# Patient Record
Sex: Female | Born: 1935 | Race: White | Hispanic: No | Smoking: Former smoker
Health system: Southern US, Community
[De-identification: ages and names within clinical notes are randomized; demographics above are authoritative.]

## PROBLEM LIST (undated history)

## (undated) DIAGNOSIS — M4800 Spinal stenosis, site unspecified: Secondary | ICD-10-CM

## (undated) DIAGNOSIS — I739 Peripheral vascular disease, unspecified: Secondary | ICD-10-CM

## (undated) DIAGNOSIS — E785 Hyperlipidemia, unspecified: Secondary | ICD-10-CM

## (undated) DIAGNOSIS — I701 Atherosclerosis of renal artery: Secondary | ICD-10-CM

## (undated) DIAGNOSIS — E039 Hypothyroidism, unspecified: Secondary | ICD-10-CM

## (undated) DIAGNOSIS — F32A Depression, unspecified: Secondary | ICD-10-CM

## (undated) DIAGNOSIS — I509 Heart failure, unspecified: Secondary | ICD-10-CM

## (undated) DIAGNOSIS — G629 Polyneuropathy, unspecified: Secondary | ICD-10-CM

## (undated) DIAGNOSIS — H8110 Benign paroxysmal vertigo, unspecified ear: Secondary | ICD-10-CM

## (undated) DIAGNOSIS — F329 Major depressive disorder, single episode, unspecified: Secondary | ICD-10-CM

## (undated) DIAGNOSIS — I10 Essential (primary) hypertension: Secondary | ICD-10-CM

## (undated) DIAGNOSIS — M810 Age-related osteoporosis without current pathological fracture: Secondary | ICD-10-CM

## (undated) DIAGNOSIS — E119 Type 2 diabetes mellitus without complications: Secondary | ICD-10-CM

## (undated) DIAGNOSIS — J45909 Unspecified asthma, uncomplicated: Secondary | ICD-10-CM

## (undated) DIAGNOSIS — J449 Chronic obstructive pulmonary disease, unspecified: Secondary | ICD-10-CM

## (undated) DIAGNOSIS — K219 Gastro-esophageal reflux disease without esophagitis: Secondary | ICD-10-CM

## (undated) DIAGNOSIS — G609 Hereditary and idiopathic neuropathy, unspecified: Secondary | ICD-10-CM

## (undated) HISTORY — PX: ABDOMINAL HYSTERECTOMY: SHX81

## (undated) HISTORY — DX: Chronic obstructive pulmonary disease, unspecified: J44.9

## (undated) HISTORY — DX: Depression, unspecified: F32.A

## (undated) HISTORY — DX: Hypothyroidism, unspecified: E03.9

## (undated) HISTORY — DX: Spinal stenosis, site unspecified: M48.00

## (undated) HISTORY — DX: Hereditary and idiopathic neuropathy, unspecified: G60.9

## (undated) HISTORY — DX: Gastro-esophageal reflux disease without esophagitis: K21.9

## (undated) HISTORY — DX: Peripheral vascular disease, unspecified: I73.9

## (undated) HISTORY — DX: Essential (primary) hypertension: I10

## (undated) HISTORY — DX: Atherosclerosis of renal artery: I70.1

## (undated) HISTORY — DX: Major depressive disorder, single episode, unspecified: F32.9

## (undated) HISTORY — DX: Type 2 diabetes mellitus without complications: E11.9

## (undated) HISTORY — PX: VEIN SURGERY: SHX48

## (undated) HISTORY — DX: Polyneuropathy, unspecified: G62.9

## (undated) HISTORY — DX: Benign paroxysmal vertigo, unspecified ear: H81.10

## (undated) HISTORY — PX: KIDNEY SURGERY: SHX687

## (undated) HISTORY — DX: Hyperlipidemia, unspecified: E78.5

## (undated) HISTORY — PX: TONSILLECTOMY: SHX5217

## (undated) HISTORY — PX: APPENDECTOMY: SHX54

## (undated) HISTORY — DX: Age-related osteoporosis without current pathological fracture: M81.0

## (undated) HISTORY — PX: CATARACT EXTRACTION W/PHACO: SHX586

## (undated) HISTORY — PX: ILIAC ARTERY STENT: SHX1786

## (undated) HISTORY — PX: BACK SURGERY: SHX140

---

## 1998-07-07 ENCOUNTER — Other Ambulatory Visit: Admission: RE | Admit: 1998-07-07 | Discharge: 1998-07-07 | Payer: Self-pay | Admitting: Family Medicine

## 1999-08-04 ENCOUNTER — Encounter: Payer: Self-pay | Admitting: Nephrology

## 1999-08-04 ENCOUNTER — Encounter: Admission: RE | Admit: 1999-08-04 | Discharge: 1999-08-04 | Payer: Self-pay | Admitting: Nephrology

## 1999-08-09 ENCOUNTER — Encounter: Payer: Self-pay | Admitting: Nephrology

## 1999-08-09 ENCOUNTER — Ambulatory Visit (HOSPITAL_COMMUNITY): Admission: RE | Admit: 1999-08-09 | Discharge: 1999-08-09 | Payer: Self-pay | Admitting: Nephrology

## 1999-11-26 ENCOUNTER — Ambulatory Visit (HOSPITAL_COMMUNITY): Admission: RE | Admit: 1999-11-26 | Discharge: 1999-11-26 | Payer: Self-pay | Admitting: Family Medicine

## 2000-01-07 ENCOUNTER — Encounter: Payer: Self-pay | Admitting: Family Medicine

## 2000-01-07 ENCOUNTER — Encounter: Admission: RE | Admit: 2000-01-07 | Discharge: 2000-01-07 | Payer: Self-pay | Admitting: Family Medicine

## 2000-04-13 ENCOUNTER — Encounter: Payer: Self-pay | Admitting: Neurosurgery

## 2000-04-13 ENCOUNTER — Ambulatory Visit (HOSPITAL_COMMUNITY): Admission: RE | Admit: 2000-04-13 | Discharge: 2000-04-13 | Payer: Self-pay | Admitting: Neurosurgery

## 2000-04-27 ENCOUNTER — Encounter: Payer: Self-pay | Admitting: Neurosurgery

## 2000-04-27 ENCOUNTER — Ambulatory Visit (HOSPITAL_COMMUNITY): Admission: RE | Admit: 2000-04-27 | Discharge: 2000-04-27 | Payer: Self-pay | Admitting: Neurosurgery

## 2000-05-11 ENCOUNTER — Ambulatory Visit (HOSPITAL_COMMUNITY): Admission: RE | Admit: 2000-05-11 | Discharge: 2000-05-11 | Payer: Self-pay | Admitting: Neurosurgery

## 2000-05-11 ENCOUNTER — Encounter: Payer: Self-pay | Admitting: Neurosurgery

## 2001-07-18 ENCOUNTER — Encounter: Admission: RE | Admit: 2001-07-18 | Discharge: 2001-10-16 | Payer: Self-pay | Admitting: Unknown Physician Specialty

## 2002-03-15 ENCOUNTER — Encounter: Payer: Self-pay | Admitting: Family Medicine

## 2002-03-15 ENCOUNTER — Encounter: Admission: RE | Admit: 2002-03-15 | Discharge: 2002-03-15 | Payer: Self-pay | Admitting: Family Medicine

## 2004-07-07 ENCOUNTER — Ambulatory Visit (HOSPITAL_COMMUNITY): Admission: RE | Admit: 2004-07-07 | Discharge: 2004-07-07 | Payer: Self-pay | Admitting: *Deleted

## 2004-10-18 ENCOUNTER — Emergency Department: Payer: Self-pay | Admitting: Emergency Medicine

## 2004-11-10 ENCOUNTER — Observation Stay (HOSPITAL_COMMUNITY): Admission: RE | Admit: 2004-11-10 | Discharge: 2004-11-11 | Payer: Self-pay | Admitting: Urology

## 2005-02-11 ENCOUNTER — Ambulatory Visit: Payer: Self-pay | Admitting: Ophthalmology

## 2005-02-15 ENCOUNTER — Ambulatory Visit: Payer: Self-pay | Admitting: Ophthalmology

## 2005-05-27 ENCOUNTER — Ambulatory Visit: Payer: Self-pay | Admitting: Ophthalmology

## 2005-05-31 ENCOUNTER — Ambulatory Visit: Payer: Self-pay | Admitting: Ophthalmology

## 2005-10-27 ENCOUNTER — Encounter: Admission: RE | Admit: 2005-10-27 | Discharge: 2005-10-27 | Payer: Self-pay | Admitting: Family Medicine

## 2005-12-12 ENCOUNTER — Ambulatory Visit: Payer: Self-pay | Admitting: Unknown Physician Specialty

## 2005-12-12 ENCOUNTER — Other Ambulatory Visit: Payer: Self-pay

## 2005-12-15 ENCOUNTER — Ambulatory Visit: Payer: Self-pay | Admitting: Unknown Physician Specialty

## 2006-08-08 ENCOUNTER — Ambulatory Visit: Payer: Self-pay | Admitting: *Deleted

## 2007-01-24 ENCOUNTER — Ambulatory Visit: Payer: Self-pay | Admitting: Unknown Physician Specialty

## 2008-04-29 ENCOUNTER — Ambulatory Visit: Payer: Self-pay | Admitting: Cardiology

## 2008-04-30 ENCOUNTER — Ambulatory Visit: Payer: Self-pay | Admitting: Unknown Physician Specialty

## 2008-04-30 ENCOUNTER — Other Ambulatory Visit: Payer: Self-pay

## 2008-05-02 ENCOUNTER — Ambulatory Visit: Payer: Self-pay | Admitting: Unknown Physician Specialty

## 2008-11-11 ENCOUNTER — Ambulatory Visit: Payer: Self-pay | Admitting: Unknown Physician Specialty

## 2008-11-12 ENCOUNTER — Ambulatory Visit: Payer: Self-pay | Admitting: Unknown Physician Specialty

## 2009-04-08 ENCOUNTER — Ambulatory Visit: Payer: Self-pay | Admitting: General Practice

## 2009-04-13 ENCOUNTER — Inpatient Hospital Stay: Payer: Self-pay | Admitting: General Practice

## 2009-04-18 ENCOUNTER — Encounter: Payer: Self-pay | Admitting: Internal Medicine

## 2009-05-03 ENCOUNTER — Encounter: Payer: Self-pay | Admitting: Internal Medicine

## 2010-09-21 ENCOUNTER — Ambulatory Visit: Payer: Self-pay | Admitting: Vascular Surgery

## 2011-06-13 ENCOUNTER — Ambulatory Visit: Payer: Self-pay | Admitting: Surgery

## 2011-10-25 DIAGNOSIS — M7660 Achilles tendinitis, unspecified leg: Secondary | ICD-10-CM | POA: Diagnosis not present

## 2011-11-07 DIAGNOSIS — E7800 Pure hypercholesterolemia, unspecified: Secondary | ICD-10-CM | POA: Diagnosis not present

## 2011-11-07 DIAGNOSIS — Z79899 Other long term (current) drug therapy: Secondary | ICD-10-CM | POA: Diagnosis not present

## 2011-11-07 DIAGNOSIS — I10 Essential (primary) hypertension: Secondary | ICD-10-CM | POA: Diagnosis not present

## 2011-11-07 DIAGNOSIS — E119 Type 2 diabetes mellitus without complications: Secondary | ICD-10-CM | POA: Diagnosis not present

## 2011-11-07 DIAGNOSIS — E039 Hypothyroidism, unspecified: Secondary | ICD-10-CM | POA: Diagnosis not present

## 2011-11-07 DIAGNOSIS — R42 Dizziness and giddiness: Secondary | ICD-10-CM | POA: Diagnosis not present

## 2011-11-11 DIAGNOSIS — L259 Unspecified contact dermatitis, unspecified cause: Secondary | ICD-10-CM | POA: Diagnosis not present

## 2011-11-11 DIAGNOSIS — F341 Dysthymic disorder: Secondary | ICD-10-CM | POA: Diagnosis not present

## 2011-11-11 DIAGNOSIS — J029 Acute pharyngitis, unspecified: Secondary | ICD-10-CM | POA: Diagnosis not present

## 2011-11-22 DIAGNOSIS — F341 Dysthymic disorder: Secondary | ICD-10-CM | POA: Diagnosis not present

## 2011-11-22 DIAGNOSIS — J209 Acute bronchitis, unspecified: Secondary | ICD-10-CM | POA: Diagnosis not present

## 2011-12-04 DIAGNOSIS — M549 Dorsalgia, unspecified: Secondary | ICD-10-CM | POA: Diagnosis not present

## 2011-12-04 DIAGNOSIS — M4800 Spinal stenosis, site unspecified: Secondary | ICD-10-CM | POA: Diagnosis not present

## 2011-12-05 DIAGNOSIS — R42 Dizziness and giddiness: Secondary | ICD-10-CM | POA: Diagnosis not present

## 2011-12-05 DIAGNOSIS — F341 Dysthymic disorder: Secondary | ICD-10-CM | POA: Diagnosis not present

## 2011-12-05 DIAGNOSIS — I10 Essential (primary) hypertension: Secondary | ICD-10-CM | POA: Diagnosis not present

## 2011-12-05 DIAGNOSIS — M48061 Spinal stenosis, lumbar region without neurogenic claudication: Secondary | ICD-10-CM | POA: Diagnosis not present

## 2011-12-12 ENCOUNTER — Encounter: Payer: Self-pay | Admitting: *Deleted

## 2011-12-15 DIAGNOSIS — M4800 Spinal stenosis, site unspecified: Secondary | ICD-10-CM | POA: Diagnosis not present

## 2011-12-15 DIAGNOSIS — IMO0002 Reserved for concepts with insufficient information to code with codable children: Secondary | ICD-10-CM | POA: Diagnosis not present

## 2011-12-27 DIAGNOSIS — IMO0002 Reserved for concepts with insufficient information to code with codable children: Secondary | ICD-10-CM | POA: Diagnosis not present

## 2011-12-27 DIAGNOSIS — M48062 Spinal stenosis, lumbar region with neurogenic claudication: Secondary | ICD-10-CM | POA: Diagnosis not present

## 2011-12-27 DIAGNOSIS — M47817 Spondylosis without myelopathy or radiculopathy, lumbosacral region: Secondary | ICD-10-CM | POA: Diagnosis not present

## 2012-01-03 DIAGNOSIS — H40009 Preglaucoma, unspecified, unspecified eye: Secondary | ICD-10-CM | POA: Diagnosis not present

## 2012-01-26 DIAGNOSIS — M48061 Spinal stenosis, lumbar region without neurogenic claudication: Secondary | ICD-10-CM | POA: Diagnosis not present

## 2012-01-26 DIAGNOSIS — M5126 Other intervertebral disc displacement, lumbar region: Secondary | ICD-10-CM | POA: Diagnosis not present

## 2012-01-26 DIAGNOSIS — I10 Essential (primary) hypertension: Secondary | ICD-10-CM | POA: Diagnosis not present

## 2012-01-26 DIAGNOSIS — IMO0002 Reserved for concepts with insufficient information to code with codable children: Secondary | ICD-10-CM | POA: Diagnosis not present

## 2012-01-26 DIAGNOSIS — IMO0001 Reserved for inherently not codable concepts without codable children: Secondary | ICD-10-CM | POA: Diagnosis not present

## 2012-01-31 DIAGNOSIS — M4800 Spinal stenosis, site unspecified: Secondary | ICD-10-CM | POA: Diagnosis not present

## 2012-01-31 DIAGNOSIS — M48061 Spinal stenosis, lumbar region without neurogenic claudication: Secondary | ICD-10-CM | POA: Diagnosis not present

## 2012-01-31 DIAGNOSIS — IMO0001 Reserved for inherently not codable concepts without codable children: Secondary | ICD-10-CM | POA: Diagnosis not present

## 2012-01-31 DIAGNOSIS — M5126 Other intervertebral disc displacement, lumbar region: Secondary | ICD-10-CM | POA: Diagnosis not present

## 2012-01-31 DIAGNOSIS — I10 Essential (primary) hypertension: Secondary | ICD-10-CM | POA: Diagnosis not present

## 2012-01-31 DIAGNOSIS — IMO0002 Reserved for concepts with insufficient information to code with codable children: Secondary | ICD-10-CM | POA: Diagnosis not present

## 2012-02-02 DIAGNOSIS — M5126 Other intervertebral disc displacement, lumbar region: Secondary | ICD-10-CM | POA: Diagnosis not present

## 2012-02-02 DIAGNOSIS — I10 Essential (primary) hypertension: Secondary | ICD-10-CM | POA: Diagnosis not present

## 2012-02-02 DIAGNOSIS — IMO0001 Reserved for inherently not codable concepts without codable children: Secondary | ICD-10-CM | POA: Diagnosis not present

## 2012-02-02 DIAGNOSIS — IMO0002 Reserved for concepts with insufficient information to code with codable children: Secondary | ICD-10-CM | POA: Diagnosis not present

## 2012-02-02 DIAGNOSIS — M48061 Spinal stenosis, lumbar region without neurogenic claudication: Secondary | ICD-10-CM | POA: Diagnosis not present

## 2012-02-03 DIAGNOSIS — I10 Essential (primary) hypertension: Secondary | ICD-10-CM | POA: Diagnosis not present

## 2012-02-03 DIAGNOSIS — M48061 Spinal stenosis, lumbar region without neurogenic claudication: Secondary | ICD-10-CM | POA: Diagnosis not present

## 2012-02-03 DIAGNOSIS — IMO0002 Reserved for concepts with insufficient information to code with codable children: Secondary | ICD-10-CM | POA: Diagnosis not present

## 2012-02-03 DIAGNOSIS — M5126 Other intervertebral disc displacement, lumbar region: Secondary | ICD-10-CM | POA: Diagnosis not present

## 2012-02-03 DIAGNOSIS — IMO0001 Reserved for inherently not codable concepts without codable children: Secondary | ICD-10-CM | POA: Diagnosis not present

## 2012-02-06 DIAGNOSIS — M5126 Other intervertebral disc displacement, lumbar region: Secondary | ICD-10-CM | POA: Diagnosis not present

## 2012-02-06 DIAGNOSIS — IMO0001 Reserved for inherently not codable concepts without codable children: Secondary | ICD-10-CM | POA: Diagnosis not present

## 2012-02-06 DIAGNOSIS — I10 Essential (primary) hypertension: Secondary | ICD-10-CM | POA: Diagnosis not present

## 2012-02-06 DIAGNOSIS — IMO0002 Reserved for concepts with insufficient information to code with codable children: Secondary | ICD-10-CM | POA: Diagnosis not present

## 2012-02-06 DIAGNOSIS — M48061 Spinal stenosis, lumbar region without neurogenic claudication: Secondary | ICD-10-CM | POA: Diagnosis not present

## 2012-02-09 DIAGNOSIS — I10 Essential (primary) hypertension: Secondary | ICD-10-CM | POA: Diagnosis not present

## 2012-02-09 DIAGNOSIS — IMO0001 Reserved for inherently not codable concepts without codable children: Secondary | ICD-10-CM | POA: Diagnosis not present

## 2012-02-09 DIAGNOSIS — M5126 Other intervertebral disc displacement, lumbar region: Secondary | ICD-10-CM | POA: Diagnosis not present

## 2012-02-09 DIAGNOSIS — IMO0002 Reserved for concepts with insufficient information to code with codable children: Secondary | ICD-10-CM | POA: Diagnosis not present

## 2012-02-09 DIAGNOSIS — M48061 Spinal stenosis, lumbar region without neurogenic claudication: Secondary | ICD-10-CM | POA: Diagnosis not present

## 2012-02-14 DIAGNOSIS — IMO0002 Reserved for concepts with insufficient information to code with codable children: Secondary | ICD-10-CM | POA: Diagnosis not present

## 2012-02-14 DIAGNOSIS — M5126 Other intervertebral disc displacement, lumbar region: Secondary | ICD-10-CM | POA: Diagnosis not present

## 2012-02-14 DIAGNOSIS — I10 Essential (primary) hypertension: Secondary | ICD-10-CM | POA: Diagnosis not present

## 2012-02-14 DIAGNOSIS — IMO0001 Reserved for inherently not codable concepts without codable children: Secondary | ICD-10-CM | POA: Diagnosis not present

## 2012-02-14 DIAGNOSIS — M48061 Spinal stenosis, lumbar region without neurogenic claudication: Secondary | ICD-10-CM | POA: Diagnosis not present

## 2012-02-16 DIAGNOSIS — M5126 Other intervertebral disc displacement, lumbar region: Secondary | ICD-10-CM | POA: Diagnosis not present

## 2012-02-16 DIAGNOSIS — IMO0001 Reserved for inherently not codable concepts without codable children: Secondary | ICD-10-CM | POA: Diagnosis not present

## 2012-02-16 DIAGNOSIS — IMO0002 Reserved for concepts with insufficient information to code with codable children: Secondary | ICD-10-CM | POA: Diagnosis not present

## 2012-02-16 DIAGNOSIS — I10 Essential (primary) hypertension: Secondary | ICD-10-CM | POA: Diagnosis not present

## 2012-02-16 DIAGNOSIS — M48061 Spinal stenosis, lumbar region without neurogenic claudication: Secondary | ICD-10-CM | POA: Diagnosis not present

## 2012-02-20 DIAGNOSIS — IMO0001 Reserved for inherently not codable concepts without codable children: Secondary | ICD-10-CM | POA: Diagnosis not present

## 2012-02-20 DIAGNOSIS — M5126 Other intervertebral disc displacement, lumbar region: Secondary | ICD-10-CM | POA: Diagnosis not present

## 2012-02-20 DIAGNOSIS — I10 Essential (primary) hypertension: Secondary | ICD-10-CM | POA: Diagnosis not present

## 2012-02-20 DIAGNOSIS — IMO0002 Reserved for concepts with insufficient information to code with codable children: Secondary | ICD-10-CM | POA: Diagnosis not present

## 2012-02-20 DIAGNOSIS — M48061 Spinal stenosis, lumbar region without neurogenic claudication: Secondary | ICD-10-CM | POA: Diagnosis not present

## 2012-02-21 DIAGNOSIS — R42 Dizziness and giddiness: Secondary | ICD-10-CM | POA: Diagnosis not present

## 2012-02-21 DIAGNOSIS — J309 Allergic rhinitis, unspecified: Secondary | ICD-10-CM | POA: Diagnosis not present

## 2012-02-23 DIAGNOSIS — IMO0002 Reserved for concepts with insufficient information to code with codable children: Secondary | ICD-10-CM | POA: Diagnosis not present

## 2012-02-23 DIAGNOSIS — M5126 Other intervertebral disc displacement, lumbar region: Secondary | ICD-10-CM | POA: Diagnosis not present

## 2012-02-23 DIAGNOSIS — M48061 Spinal stenosis, lumbar region without neurogenic claudication: Secondary | ICD-10-CM | POA: Diagnosis not present

## 2012-02-23 DIAGNOSIS — I10 Essential (primary) hypertension: Secondary | ICD-10-CM | POA: Diagnosis not present

## 2012-02-23 DIAGNOSIS — IMO0001 Reserved for inherently not codable concepts without codable children: Secondary | ICD-10-CM | POA: Diagnosis not present

## 2012-02-27 DIAGNOSIS — M5126 Other intervertebral disc displacement, lumbar region: Secondary | ICD-10-CM | POA: Diagnosis not present

## 2012-02-27 DIAGNOSIS — I10 Essential (primary) hypertension: Secondary | ICD-10-CM | POA: Diagnosis not present

## 2012-02-27 DIAGNOSIS — M48061 Spinal stenosis, lumbar region without neurogenic claudication: Secondary | ICD-10-CM | POA: Diagnosis not present

## 2012-02-27 DIAGNOSIS — IMO0001 Reserved for inherently not codable concepts without codable children: Secondary | ICD-10-CM | POA: Diagnosis not present

## 2012-02-27 DIAGNOSIS — IMO0002 Reserved for concepts with insufficient information to code with codable children: Secondary | ICD-10-CM | POA: Diagnosis not present

## 2012-03-02 DIAGNOSIS — IMO0001 Reserved for inherently not codable concepts without codable children: Secondary | ICD-10-CM | POA: Diagnosis not present

## 2012-03-02 DIAGNOSIS — I10 Essential (primary) hypertension: Secondary | ICD-10-CM | POA: Diagnosis not present

## 2012-03-02 DIAGNOSIS — M48061 Spinal stenosis, lumbar region without neurogenic claudication: Secondary | ICD-10-CM | POA: Diagnosis not present

## 2012-03-02 DIAGNOSIS — M5126 Other intervertebral disc displacement, lumbar region: Secondary | ICD-10-CM | POA: Diagnosis not present

## 2012-03-02 DIAGNOSIS — IMO0002 Reserved for concepts with insufficient information to code with codable children: Secondary | ICD-10-CM | POA: Diagnosis not present

## 2012-03-05 DIAGNOSIS — M48061 Spinal stenosis, lumbar region without neurogenic claudication: Secondary | ICD-10-CM | POA: Diagnosis not present

## 2012-03-05 DIAGNOSIS — M5126 Other intervertebral disc displacement, lumbar region: Secondary | ICD-10-CM | POA: Diagnosis not present

## 2012-03-05 DIAGNOSIS — IMO0002 Reserved for concepts with insufficient information to code with codable children: Secondary | ICD-10-CM | POA: Diagnosis not present

## 2012-03-05 DIAGNOSIS — IMO0001 Reserved for inherently not codable concepts without codable children: Secondary | ICD-10-CM | POA: Diagnosis not present

## 2012-03-05 DIAGNOSIS — I10 Essential (primary) hypertension: Secondary | ICD-10-CM | POA: Diagnosis not present

## 2012-03-07 DIAGNOSIS — E039 Hypothyroidism, unspecified: Secondary | ICD-10-CM | POA: Diagnosis not present

## 2012-03-07 DIAGNOSIS — R42 Dizziness and giddiness: Secondary | ICD-10-CM | POA: Diagnosis not present

## 2012-03-07 DIAGNOSIS — I10 Essential (primary) hypertension: Secondary | ICD-10-CM | POA: Diagnosis not present

## 2012-03-07 DIAGNOSIS — E119 Type 2 diabetes mellitus without complications: Secondary | ICD-10-CM | POA: Diagnosis not present

## 2012-03-07 DIAGNOSIS — E7800 Pure hypercholesterolemia, unspecified: Secondary | ICD-10-CM | POA: Diagnosis not present

## 2012-03-23 DIAGNOSIS — R42 Dizziness and giddiness: Secondary | ICD-10-CM | POA: Diagnosis not present

## 2012-03-23 DIAGNOSIS — J309 Allergic rhinitis, unspecified: Secondary | ICD-10-CM | POA: Diagnosis not present

## 2012-03-23 DIAGNOSIS — M48061 Spinal stenosis, lumbar region without neurogenic claudication: Secondary | ICD-10-CM | POA: Diagnosis not present

## 2012-06-11 DIAGNOSIS — R5383 Other fatigue: Secondary | ICD-10-CM | POA: Diagnosis not present

## 2012-06-11 DIAGNOSIS — E119 Type 2 diabetes mellitus without complications: Secondary | ICD-10-CM | POA: Diagnosis not present

## 2012-06-11 DIAGNOSIS — I10 Essential (primary) hypertension: Secondary | ICD-10-CM | POA: Diagnosis not present

## 2012-06-11 DIAGNOSIS — E7800 Pure hypercholesterolemia, unspecified: Secondary | ICD-10-CM | POA: Diagnosis not present

## 2012-06-11 DIAGNOSIS — R5381 Other malaise: Secondary | ICD-10-CM | POA: Diagnosis not present

## 2012-06-15 DIAGNOSIS — R42 Dizziness and giddiness: Secondary | ICD-10-CM | POA: Diagnosis not present

## 2012-06-15 DIAGNOSIS — I8310 Varicose veins of unspecified lower extremity with inflammation: Secondary | ICD-10-CM | POA: Diagnosis not present

## 2012-06-15 DIAGNOSIS — G609 Hereditary and idiopathic neuropathy, unspecified: Secondary | ICD-10-CM | POA: Diagnosis not present

## 2012-06-26 DIAGNOSIS — J209 Acute bronchitis, unspecified: Secondary | ICD-10-CM | POA: Diagnosis not present

## 2012-07-02 DIAGNOSIS — I10 Essential (primary) hypertension: Secondary | ICD-10-CM | POA: Diagnosis not present

## 2012-07-09 DIAGNOSIS — Z23 Encounter for immunization: Secondary | ICD-10-CM | POA: Diagnosis not present

## 2012-07-09 DIAGNOSIS — I10 Essential (primary) hypertension: Secondary | ICD-10-CM | POA: Diagnosis not present

## 2012-07-10 DIAGNOSIS — H4010X0 Unspecified open-angle glaucoma, stage unspecified: Secondary | ICD-10-CM | POA: Diagnosis not present

## 2012-07-16 DIAGNOSIS — R05 Cough: Secondary | ICD-10-CM | POA: Diagnosis not present

## 2012-07-17 DIAGNOSIS — R05 Cough: Secondary | ICD-10-CM | POA: Diagnosis not present

## 2012-07-17 DIAGNOSIS — R059 Cough, unspecified: Secondary | ICD-10-CM | POA: Diagnosis not present

## 2012-08-02 DIAGNOSIS — J209 Acute bronchitis, unspecified: Secondary | ICD-10-CM | POA: Diagnosis not present

## 2012-08-06 DIAGNOSIS — J209 Acute bronchitis, unspecified: Secondary | ICD-10-CM | POA: Diagnosis not present

## 2012-08-16 DIAGNOSIS — H8110 Benign paroxysmal vertigo, unspecified ear: Secondary | ICD-10-CM | POA: Diagnosis not present

## 2012-09-13 DIAGNOSIS — G609 Hereditary and idiopathic neuropathy, unspecified: Secondary | ICD-10-CM | POA: Diagnosis not present

## 2012-09-13 DIAGNOSIS — E7800 Pure hypercholesterolemia, unspecified: Secondary | ICD-10-CM | POA: Diagnosis not present

## 2012-09-13 DIAGNOSIS — I10 Essential (primary) hypertension: Secondary | ICD-10-CM | POA: Diagnosis not present

## 2012-09-13 DIAGNOSIS — R5383 Other fatigue: Secondary | ICD-10-CM | POA: Diagnosis not present

## 2012-09-13 DIAGNOSIS — E119 Type 2 diabetes mellitus without complications: Secondary | ICD-10-CM | POA: Diagnosis not present

## 2012-09-13 DIAGNOSIS — R5381 Other malaise: Secondary | ICD-10-CM | POA: Diagnosis not present

## 2012-10-04 DIAGNOSIS — R1011 Right upper quadrant pain: Secondary | ICD-10-CM | POA: Diagnosis not present

## 2012-10-04 DIAGNOSIS — K219 Gastro-esophageal reflux disease without esophagitis: Secondary | ICD-10-CM | POA: Diagnosis not present

## 2012-10-11 DIAGNOSIS — R1011 Right upper quadrant pain: Secondary | ICD-10-CM | POA: Diagnosis not present

## 2012-10-11 DIAGNOSIS — K828 Other specified diseases of gallbladder: Secondary | ICD-10-CM | POA: Diagnosis not present

## 2012-10-15 DIAGNOSIS — E039 Hypothyroidism, unspecified: Secondary | ICD-10-CM | POA: Diagnosis not present

## 2012-10-15 DIAGNOSIS — I10 Essential (primary) hypertension: Secondary | ICD-10-CM | POA: Diagnosis not present

## 2012-10-15 DIAGNOSIS — F411 Generalized anxiety disorder: Secondary | ICD-10-CM | POA: Diagnosis not present

## 2012-10-15 DIAGNOSIS — K219 Gastro-esophageal reflux disease without esophagitis: Secondary | ICD-10-CM | POA: Diagnosis not present

## 2012-10-19 DIAGNOSIS — M722 Plantar fascial fibromatosis: Secondary | ICD-10-CM | POA: Diagnosis not present

## 2012-10-30 DIAGNOSIS — K219 Gastro-esophageal reflux disease without esophagitis: Secondary | ICD-10-CM | POA: Diagnosis not present

## 2012-11-14 DIAGNOSIS — Z1211 Encounter for screening for malignant neoplasm of colon: Secondary | ICD-10-CM | POA: Diagnosis not present

## 2012-11-14 DIAGNOSIS — R1032 Left lower quadrant pain: Secondary | ICD-10-CM | POA: Diagnosis not present

## 2012-11-14 DIAGNOSIS — R1310 Dysphagia, unspecified: Secondary | ICD-10-CM | POA: Diagnosis not present

## 2012-11-21 ENCOUNTER — Ambulatory Visit: Payer: Self-pay | Admitting: Gastroenterology

## 2012-11-21 DIAGNOSIS — R109 Unspecified abdominal pain: Secondary | ICD-10-CM | POA: Diagnosis not present

## 2012-11-21 DIAGNOSIS — R1310 Dysphagia, unspecified: Secondary | ICD-10-CM | POA: Diagnosis not present

## 2012-11-30 DIAGNOSIS — J189 Pneumonia, unspecified organism: Secondary | ICD-10-CM | POA: Diagnosis not present

## 2012-12-05 DIAGNOSIS — J189 Pneumonia, unspecified organism: Secondary | ICD-10-CM | POA: Diagnosis not present

## 2012-12-12 DIAGNOSIS — R5381 Other malaise: Secondary | ICD-10-CM | POA: Diagnosis not present

## 2012-12-12 DIAGNOSIS — J449 Chronic obstructive pulmonary disease, unspecified: Secondary | ICD-10-CM | POA: Diagnosis not present

## 2012-12-12 DIAGNOSIS — F411 Generalized anxiety disorder: Secondary | ICD-10-CM | POA: Diagnosis not present

## 2012-12-12 DIAGNOSIS — E039 Hypothyroidism, unspecified: Secondary | ICD-10-CM | POA: Diagnosis not present

## 2012-12-20 ENCOUNTER — Encounter: Payer: Self-pay | Admitting: Cardiovascular Disease

## 2013-01-04 DIAGNOSIS — H8110 Benign paroxysmal vertigo, unspecified ear: Secondary | ICD-10-CM | POA: Diagnosis not present

## 2013-01-08 DIAGNOSIS — H40009 Preglaucoma, unspecified, unspecified eye: Secondary | ICD-10-CM | POA: Diagnosis not present

## 2013-01-22 DIAGNOSIS — R42 Dizziness and giddiness: Secondary | ICD-10-CM | POA: Diagnosis not present

## 2013-01-22 DIAGNOSIS — E119 Type 2 diabetes mellitus without complications: Secondary | ICD-10-CM | POA: Diagnosis not present

## 2013-01-22 DIAGNOSIS — I10 Essential (primary) hypertension: Secondary | ICD-10-CM | POA: Diagnosis not present

## 2013-01-22 DIAGNOSIS — E7800 Pure hypercholesterolemia, unspecified: Secondary | ICD-10-CM | POA: Diagnosis not present

## 2013-02-12 DIAGNOSIS — J449 Chronic obstructive pulmonary disease, unspecified: Secondary | ICD-10-CM | POA: Diagnosis not present

## 2013-02-12 DIAGNOSIS — R300 Dysuria: Secondary | ICD-10-CM | POA: Diagnosis not present

## 2013-03-08 DIAGNOSIS — J441 Chronic obstructive pulmonary disease with (acute) exacerbation: Secondary | ICD-10-CM | POA: Diagnosis not present

## 2013-03-18 ENCOUNTER — Ambulatory Visit (INDEPENDENT_AMBULATORY_CARE_PROVIDER_SITE_OTHER): Payer: Medicare Other | Admitting: Internal Medicine

## 2013-03-18 ENCOUNTER — Encounter: Payer: Self-pay | Admitting: Internal Medicine

## 2013-03-18 VITALS — BP 140/78 | HR 65 | Temp 97.0°F | Ht 60.0 in | Wt 172.0 lb

## 2013-03-18 DIAGNOSIS — I10 Essential (primary) hypertension: Secondary | ICD-10-CM | POA: Diagnosis not present

## 2013-03-18 DIAGNOSIS — R05 Cough: Secondary | ICD-10-CM | POA: Diagnosis not present

## 2013-03-18 DIAGNOSIS — R059 Cough, unspecified: Secondary | ICD-10-CM

## 2013-03-18 MED ORDER — METHYLPREDNISOLONE ACETATE 80 MG/ML IJ SUSP
120.0000 mg | Freq: Once | INTRAMUSCULAR | Status: AC
  Administered 2013-03-18: 120 mg via INTRAMUSCULAR

## 2013-03-18 MED ORDER — NEBIVOLOL HCL 5 MG PO TABS: 5.0000 mg | ORAL_TABLET | Freq: Every day | ORAL | Status: DC

## 2013-03-18 MED ORDER — PANTOPRAZOLE SODIUM 40 MG PO TBEC: 40.0000 mg | DELAYED_RELEASE_TABLET | Freq: Every day | ORAL | Status: DC

## 2013-03-18 MED ORDER — FAMOTIDINE 20 MG PO TABS: ORAL_TABLET | ORAL | Status: DC

## 2013-03-18 NOTE — Patient Instructions (Addendum)
Stop atenolol and prilosec and spiriva and symbicort   Only use the albuterol  if you can't catch your breath.  Bystolic 5 mg daily and if blood pressure is too high take it twice daily  Pantoprazole (protonix) 40 mg   Take 30-60 min before first meal of the day and Pepcid 20 mg one bedtime until return to office - this is the best way to tell whether stomach acid is contributing to your problem.    GERD (REFLUX)  is an extremely common cause of respiratory symptoms, many times with no significant heartburn at all.    It can be treated with medication, but also with lifestyle changes including avoidance of late meals, excessive alcohol, smoking cessation, and avoid fatty foods, chocolate, peppermint, colas, red wine, and acidic juices such as orange juice.  NO MINT OR MENTHOL PRODUCTS SO NO COUGH DROPS  USE SUGARLESS CANDY INSTEAD (jolley ranchers or Stover's)  NO OIL BASED VITAMINS - use powdered substitutes.    Please schedule a follow up office visit in 2 weeks, sooner if needed to the [place] office with all active medication plus pill box .

## 2013-03-18 NOTE — Progress Notes (Signed)
  Subjective:    Patient ID: Tracy Richards, female    DOB: 1929-04-11 MRN: [id number]  HPI  50 yowf quit smoking around 1974  At wt around 130 with no respiratory problems until sinus problems starting in her 74's referred 03/18/2013 to pulmonary clinic by Lonie Peak for refractory cough and wheeze despite rx with symbiocort and spiriva  03/18/2013 1st pulmonary cc nasal congestion assoc with cough indolent onset, persistent x 2 years present 24 h per day but does does not bring up any sign mucus, best with prednisone but "it doesn't last".  Apparently not able to take muliple meds (see allergies). Assoc with subj wheeze and mild doe, does not respond to saba.  No obvious daytime variabilty or assoc c  cp or chest tightness, subjective wheeze overt sinus or hb symptoms. No unusual exp hx or h/o childhood pna/ asthma or knowledge of premature birth.    . Also denies any obvious fluctuation of symptoms with weather or environmental changes or other aggravating or alleviating factors except as outlined above   Review of Systems  Constitutional: Negative for fever, chills and unexpected weight change.  HENT: Positive for congestion. Negative for ear pain, nosebleeds, sore throat, rhinorrhea, sneezing, trouble swallowing, dental problem, voice change, postnasal drip and sinus pressure.   Eyes: Negative for visual disturbance.  Respiratory: Positive for cough and shortness of breath. Negative for choking.   Cardiovascular: Negative for chest pain and leg swelling.  Gastrointestinal: Negative for vomiting, abdominal pain and diarrhea.  Genitourinary: Negative for difficulty urinating.  Musculoskeletal: Positive for arthralgias.  Skin: Negative for rash.  Neurological: Negative for tremors, syncope and headaches.  Hematological: Does not bruise/bleed easily.       Objective:   Physical Exam  amb obese wf with nasal tone to voice.  Wt Readings from Last 3 Encounters:  03/18/13 172 lb (78.019  kg)    HEENT mild turbinate edema.  Oropharynx no thrush or excess pnd or cobblestoning.  No JVD or cervical adenopathy. Mild accessory muscle hypertrophy. Trachea midline, nl thryroid. Chest was hyperinflated by percussion with diminished breath sounds and moderate increased exp time without wheeze. Hoover sign positive at mid inspiration. Regular rate and rhythm without murmur gallop or rub or increase P2 or edema.  Abd: no hsm, nl excursion. Ext warm without cyanosis or clubbing.   cxr  07/17/12 [facility] report ? [facility] CP angle          Assessment & Plan:

## 2013-03-19 DIAGNOSIS — R05 Cough: Secondary | ICD-10-CM | POA: Insufficient documentation

## 2013-03-19 DIAGNOSIS — R059 Cough, unspecified: Secondary | ICD-10-CM | POA: Insufficient documentation

## 2013-03-19 DIAGNOSIS — I10 Essential (primary) hypertension: Secondary | ICD-10-CM | POA: Insufficient documentation

## 2013-03-19 NOTE — Assessment & Plan Note (Signed)
Strongly prefer in this setting: Bystolic, the most beta -1  selective Beta blocker available in sample form, with bisoprolol the most selective generic choice  on the market, at least until we sort out the source of her resp complaints

## 2013-04-01 ENCOUNTER — Telehealth: Payer: Self-pay | Admitting: Internal Medicine

## 2013-04-01 ENCOUNTER — Other Ambulatory Visit (INDEPENDENT_AMBULATORY_CARE_PROVIDER_SITE_OTHER): Payer: Medicare Other

## 2013-04-01 ENCOUNTER — Ambulatory Visit (INDEPENDENT_AMBULATORY_CARE_PROVIDER_SITE_OTHER): Payer: Medicare Other | Admitting: Internal Medicine

## 2013-04-01 ENCOUNTER — Ambulatory Visit (INDEPENDENT_AMBULATORY_CARE_PROVIDER_SITE_OTHER)
Admission: RE | Admit: 2013-04-01 | Discharge: 2013-04-01 | Disposition: A | Discharge status: Home / Self Care | Payer: Medicare Other | Source: Ambulatory Visit | Attending: Internal Medicine | Admitting: Internal Medicine

## 2013-04-01 ENCOUNTER — Encounter: Payer: Self-pay | Admitting: Internal Medicine

## 2013-04-01 VITALS — BP 154/82 | HR 80 | Temp 97.3°F | Ht 60.0 in | Wt 168.8 lb

## 2013-04-01 DIAGNOSIS — E039 Hypothyroidism, unspecified: Secondary | ICD-10-CM

## 2013-04-01 DIAGNOSIS — R0989 Other specified symptoms and signs involving the circulatory and respiratory systems: Secondary | ICD-10-CM

## 2013-04-01 DIAGNOSIS — R0600 Dyspnea, unspecified: Secondary | ICD-10-CM

## 2013-04-01 DIAGNOSIS — R05 Cough: Secondary | ICD-10-CM

## 2013-04-01 DIAGNOSIS — R0609 Other forms of dyspnea: Secondary | ICD-10-CM | POA: Diagnosis not present

## 2013-04-01 DIAGNOSIS — I10 Essential (primary) hypertension: Secondary | ICD-10-CM

## 2013-04-01 DIAGNOSIS — E038 Other specified hypothyroidism: Secondary | ICD-10-CM | POA: Insufficient documentation

## 2013-04-01 DIAGNOSIS — J449 Chronic obstructive pulmonary disease, unspecified: Secondary | ICD-10-CM | POA: Diagnosis not present

## 2013-04-01 LAB — BASIC METABOLIC PANEL
CO2: 24 mEq/L (ref 19–32)
Chloride: 90 mEq/L — ABNORMAL LOW (ref 96–112)
Sodium: 128 mEq/L — ABNORMAL LOW (ref 135–145)

## 2013-04-01 LAB — CBC WITH DIFFERENTIAL/PLATELET
Basophils Absolute: 0.1 10*3/uL (ref 0.0–0.1)
HCT: 39 % (ref 36.0–46.0)
Lymphs Abs: 1.2 10*3/uL (ref 0.7–4.0)
Monocytes Absolute: 0.9 10*3/uL (ref 0.1–1.0)
Monocytes Relative: 10.1 % (ref 3.0–12.0)
Neutrophils Relative %: 70.8 % (ref 43.0–77.0)
Platelets: 306 10*3/uL (ref 150.0–400.0)
RDW: 13.1 % (ref 11.5–14.6)

## 2013-04-01 MED ORDER — NEBIVOLOL HCL 10 MG PO TABS: 10.0000 mg | ORAL_TABLET | Freq: Every day | ORAL | Status: DC

## 2013-04-01 NOTE — Patient Instructions (Addendum)
Increase bystolic to 10 mg daily   Please remember to go to the lab and xray department downstairs for your tests - we will call you with the results when they are available.

## 2013-04-01 NOTE — Progress Notes (Signed)
Subjective:    Patient ID: Tracy Richards, female    DOB: 01/23/1929 MRN: [id number]   Brief patient profile:  84 yowf quit smoking around 1974  At wt around 130 with no respiratory problems until sinus problems starting in her 70's referred 03/18/2013 to pulmonary clinic by Lonie Peak for refractory cough and wheeze despite rx with symbiocort and spiriva  03/18/2013 1st pulmonary cc nasal congestion assoc with cough indolent onset, persistent x 2 years present 24 h per day but does does not bring up any sign mucus, best with prednisone but "it doesn't last".  Apparently not able to take muliple meds (see allergies). Assoc with subj wheeze and mild doe, does not respond to saba. rec Stop atenolol and prilosec and spiriva and symbicort  Only use the albuterol  if you can't catch your breath. Bystolic 5 mg daily and if blood pressure is too high take it twice daily Pantoprazole (protonix) 40 mg   Take 30-60 min before first meal of the day and Pepcid 20 mg one bedtime until return to office - this is the best way to tell whether stomach acid is contributing to your problem.   GERD (REFLUX) diet       04/01/2013 f/u ov/Tracy Richards did not bring pillbox but did bring most meds and seems to know them well Chief Complaint  Patient presents with  . Acute Visit    Chills yesterday with night sweats and weakness in legs.  Cough has improved - prod at times with yellow mucus, some wheezing, and SOB unchanged.  No chest tightness or chest pain.  breathing is no worse off all inhalers - does have saba but not using.   No obvious daytime variabilty or assoc c  cp or chest tightness, subjective wheeze overt sinus or hb symptoms. No unusual exp hx or h/o childhood pna/ asthma or knowledge of premature birth.    Also denies any obvious fluctuation of symptoms with weather or environmental changes or other aggravating or alleviating factors except as outlined above   Current Medications, Allergies, Past Medical  History, Past Surgical History, Family History, and Social History were reviewed in [employer] record.  ROS  The following are not active complaints unless bolded sore throat, dysphagia, dental problems, itching, sneezing,  nasal congestion or excess/ purulent secretions, ear ache,   fever, chills, sweats, unintended wt loss, pleuritic or exertional cp, hemoptysis,  orthopnea pnd or leg swelling, presyncope, palpitations, heartburn, abdominal pain, anorexia, nausea, vomiting, diarrhea  or change in bowel or urinary habits, change in stools or urine, dysuria,hematuria,  rash, arthralgias, visual complaints, headache, numbness weakness or ataxia or problems with walking or coordination,  change in mood/affect or memory.           Objective:   Physical Exam  amb obese wf with mild nasal tone to voice.  04/01/2013       169  Wt Readings from Last 3 Encounters:  03/18/13 172 lb (78.019 kg)    HEENT mild turbinate edema.  Oropharynx no thrush or excess pnd or cobblestoning.  No JVD or cervical adenopathy. Mild accessory muscle hypertrophy. Trachea midline, nl thryroid. Chest was hyperinflated by percussion with diminished breath sounds and moderate increased exp time without wheeze. Hoover sign positive at mid inspiration. Regular rate and rhythm without murmur gallop or rub or increase P2 or edema.  Abd: no hsm, nl excursion. Ext warm without cyanosis or clubbing.    cxr 04/01/13 1. No acute finding. 2.  COPD.           Assessment & Plan:

## 2013-04-02 ENCOUNTER — Telehealth: Payer: Self-pay | Admitting: Internal Medicine

## 2013-04-02 LAB — ALLERGY PROFILE REGION II-DC, DE, MD, ~~LOC~~, VA
Allergen, D pternoyssinus,d7: <0.1 kU/L
Alternaria Alternata: <0.1 kU/L
Box Elder IgE: <0.1 kU/L
Cladosporium Herbarum: <0.1 kU/L
Cockroach: <0.1 kU/L
D. farinae: <0.1 kU/L
Dog Dander: <0.1 kU/L

## 2013-04-02 LAB — BRAIN NATRIURETIC PEPTIDE: Pro B Natriuretic peptide (BNP): 188 pg/mL — ABNORMAL HIGH (ref 0.0–100.0)

## 2013-04-02 NOTE — Assessment & Plan Note (Signed)
Not Adequate control on present rx, reviewed options > for now Strongly prefer in this setting: Bystolic, the most beta -1  selective Beta blocker available in sample form, with bisoprolol the most selective generic choice  on the market.    rx bystolic 10 mg daily

## 2013-04-02 NOTE — Progress Notes (Signed)
Quick Note:  Spoke with pt and notified of results per Dr. Wert. Pt verbalized understanding and denied any questions.  ______ 

## 2013-04-02 NOTE — Assessment & Plan Note (Signed)
-   Allergy profile 04/01/13 > IgE 51.4 >>>  No worse off all inhalers and on bystolic - she is allergic / intolerant to so many categories of meds that this may be a case of less is more    Each maintenance medication was reviewed in detail including most importantly the difference between maintenance and as needed and under what circumstances the prns are to be used.  Please see instructions for details which were reviewed in writing and the patient given a copy.

## 2013-04-02 NOTE — Telephone Encounter (Signed)
[name] (pt's daughter) called back requesting we call her instead of pt for xray results.  She can be reached @ [phone]. [name]

## 2013-04-02 NOTE — Telephone Encounter (Signed)
Spoke with patients daughter [name]-- Reiterated all labs and cxr results from 04/01/13. [name] verbalized understanding and nothing further needed AT  This time

## 2013-04-02 NOTE — Telephone Encounter (Signed)
Pt seen 6.30.14 by MW w/ cxr No documentation in phone note Will sign off

## 2013-04-02 NOTE — Assessment & Plan Note (Addendum)
Symptoms are markedly disproportionate to objective findings and not clear this is a lung problem but pt does appear to have difficult airway management issues. DDX of  difficult airways managment all start with A and  include Adherence, Ace Inhibitors, Acid Reflux, Active Sinus Disease, Alpha 1 Antitripsin deficiency, Anxiety masquerading as Airways dz,  ABPA,  allergy(esp in young), Aspiration (esp in elderly), Adverse effects of DPI,  Active smokers, plus two Bs  = Bronchiectasis and Beta blocker use..and one C= CHF  Adherence is always the initial "prime suspect" and is a multilayered concern that requires a "trust but verify" approach in every patient - starting with knowing how to use medications, especially inhalers, correctly, keeping up with refills and understanding the fundamental difference between maintenance and prns vs those medications only taken for a very short course and then stopped and not refilled.   ? Acid reflux > continue max rx  ? Allergy > send profile   ? Active sinus dz > check sinus ct   ? chf > check bnp

## 2013-04-03 ENCOUNTER — Encounter: Payer: Self-pay | Admitting: Internal Medicine

## 2013-04-03 ENCOUNTER — Telehealth: Payer: Self-pay | Admitting: Internal Medicine

## 2013-04-03 ENCOUNTER — Other Ambulatory Visit: Payer: Medicare Other

## 2013-04-03 NOTE — Telephone Encounter (Signed)
Will sign and forward to MW as Lorain Childes.

## 2013-04-04 DIAGNOSIS — E039 Hypothyroidism, unspecified: Secondary | ICD-10-CM | POA: Diagnosis not present

## 2013-04-04 DIAGNOSIS — G609 Hereditary and idiopathic neuropathy, unspecified: Secondary | ICD-10-CM | POA: Diagnosis not present

## 2013-04-04 DIAGNOSIS — I10 Essential (primary) hypertension: Secondary | ICD-10-CM | POA: Diagnosis not present

## 2013-04-04 DIAGNOSIS — F341 Dysthymic disorder: Secondary | ICD-10-CM | POA: Diagnosis not present

## 2013-04-08 ENCOUNTER — Ambulatory Visit: Payer: Medicare Other | Admitting: Internal Medicine

## 2013-04-10 ENCOUNTER — Encounter: Payer: Self-pay | Admitting: Family Medicine

## 2013-04-19 ENCOUNTER — Ambulatory Visit: Payer: Medicare Other | Admitting: Internal Medicine

## 2013-05-02 DIAGNOSIS — I10 Essential (primary) hypertension: Secondary | ICD-10-CM | POA: Diagnosis not present

## 2013-05-02 DIAGNOSIS — E7800 Pure hypercholesterolemia, unspecified: Secondary | ICD-10-CM | POA: Diagnosis not present

## 2013-05-02 DIAGNOSIS — E039 Hypothyroidism, unspecified: Secondary | ICD-10-CM | POA: Diagnosis not present

## 2013-05-02 DIAGNOSIS — H8110 Benign paroxysmal vertigo, unspecified ear: Secondary | ICD-10-CM | POA: Diagnosis not present

## 2013-05-02 DIAGNOSIS — F341 Dysthymic disorder: Secondary | ICD-10-CM | POA: Diagnosis not present

## 2013-05-02 DIAGNOSIS — Z9181 History of falling: Secondary | ICD-10-CM | POA: Diagnosis not present

## 2013-06-13 ENCOUNTER — Other Ambulatory Visit: Payer: Self-pay | Admitting: Internal Medicine

## 2013-07-04 DIAGNOSIS — H40009 Preglaucoma, unspecified, unspecified eye: Secondary | ICD-10-CM | POA: Diagnosis not present

## 2013-07-07 DIAGNOSIS — H81399 Other peripheral vertigo, unspecified ear: Secondary | ICD-10-CM | POA: Diagnosis not present

## 2013-07-12 DIAGNOSIS — J441 Chronic obstructive pulmonary disease with (acute) exacerbation: Secondary | ICD-10-CM | POA: Diagnosis not present

## 2013-08-01 DIAGNOSIS — E119 Type 2 diabetes mellitus without complications: Secondary | ICD-10-CM | POA: Diagnosis not present

## 2013-08-01 DIAGNOSIS — Z79899 Other long term (current) drug therapy: Secondary | ICD-10-CM | POA: Diagnosis not present

## 2013-08-01 DIAGNOSIS — E039 Hypothyroidism, unspecified: Secondary | ICD-10-CM | POA: Diagnosis not present

## 2013-08-01 DIAGNOSIS — E7800 Pure hypercholesterolemia, unspecified: Secondary | ICD-10-CM | POA: Diagnosis not present

## 2013-08-01 DIAGNOSIS — J449 Chronic obstructive pulmonary disease, unspecified: Secondary | ICD-10-CM | POA: Diagnosis not present

## 2013-08-01 DIAGNOSIS — Z23 Encounter for immunization: Secondary | ICD-10-CM | POA: Diagnosis not present

## 2013-08-01 DIAGNOSIS — F341 Dysthymic disorder: Secondary | ICD-10-CM | POA: Diagnosis not present

## 2013-08-01 DIAGNOSIS — I10 Essential (primary) hypertension: Secondary | ICD-10-CM | POA: Diagnosis not present

## 2013-08-16 DIAGNOSIS — R262 Difficulty in walking, not elsewhere classified: Secondary | ICD-10-CM | POA: Diagnosis not present

## 2013-08-16 DIAGNOSIS — G609 Hereditary and idiopathic neuropathy, unspecified: Secondary | ICD-10-CM | POA: Diagnosis not present

## 2013-08-27 DIAGNOSIS — J309 Allergic rhinitis, unspecified: Secondary | ICD-10-CM | POA: Diagnosis not present

## 2013-09-05 DIAGNOSIS — C44101 Unspecified malignant neoplasm of skin of unspecified eyelid, including canthus: Secondary | ICD-10-CM | POA: Diagnosis not present

## 2013-10-04 DIAGNOSIS — B029 Zoster without complications: Secondary | ICD-10-CM | POA: Diagnosis not present

## 2013-10-16 DIAGNOSIS — Z1331 Encounter for screening for depression: Secondary | ICD-10-CM | POA: Diagnosis not present

## 2013-10-16 DIAGNOSIS — E039 Hypothyroidism, unspecified: Secondary | ICD-10-CM | POA: Diagnosis not present

## 2013-10-16 DIAGNOSIS — F341 Dysthymic disorder: Secondary | ICD-10-CM | POA: Diagnosis not present

## 2013-10-16 DIAGNOSIS — B029 Zoster without complications: Secondary | ICD-10-CM | POA: Diagnosis not present

## 2013-10-16 DIAGNOSIS — G609 Hereditary and idiopathic neuropathy, unspecified: Secondary | ICD-10-CM | POA: Diagnosis not present

## 2013-11-26 DIAGNOSIS — J441 Chronic obstructive pulmonary disease with (acute) exacerbation: Secondary | ICD-10-CM | POA: Diagnosis not present

## 2013-12-03 DIAGNOSIS — J019 Acute sinusitis, unspecified: Secondary | ICD-10-CM | POA: Diagnosis not present

## 2013-12-03 DIAGNOSIS — J209 Acute bronchitis, unspecified: Secondary | ICD-10-CM | POA: Diagnosis not present

## 2013-12-09 DIAGNOSIS — F341 Dysthymic disorder: Secondary | ICD-10-CM | POA: Diagnosis not present

## 2013-12-09 DIAGNOSIS — I10 Essential (primary) hypertension: Secondary | ICD-10-CM | POA: Diagnosis not present

## 2013-12-09 DIAGNOSIS — G609 Hereditary and idiopathic neuropathy, unspecified: Secondary | ICD-10-CM | POA: Diagnosis not present

## 2013-12-09 DIAGNOSIS — E7800 Pure hypercholesterolemia, unspecified: Secondary | ICD-10-CM | POA: Diagnosis not present

## 2013-12-09 DIAGNOSIS — E119 Type 2 diabetes mellitus without complications: Secondary | ICD-10-CM | POA: Diagnosis not present

## 2013-12-09 DIAGNOSIS — E039 Hypothyroidism, unspecified: Secondary | ICD-10-CM | POA: Diagnosis not present

## 2013-12-16 DIAGNOSIS — E782 Mixed hyperlipidemia: Secondary | ICD-10-CM | POA: Diagnosis not present

## 2013-12-16 DIAGNOSIS — I70219 Atherosclerosis of native arteries of extremities with intermittent claudication, unspecified extremity: Secondary | ICD-10-CM | POA: Diagnosis not present

## 2013-12-16 DIAGNOSIS — R42 Dizziness and giddiness: Secondary | ICD-10-CM | POA: Diagnosis not present

## 2013-12-16 DIAGNOSIS — I10 Essential (primary) hypertension: Secondary | ICD-10-CM | POA: Diagnosis not present

## 2013-12-17 DIAGNOSIS — R209 Unspecified disturbances of skin sensation: Secondary | ICD-10-CM | POA: Diagnosis not present

## 2013-12-17 DIAGNOSIS — R439 Unspecified disturbances of smell and taste: Secondary | ICD-10-CM | POA: Diagnosis not present

## 2013-12-17 DIAGNOSIS — R262 Difficulty in walking, not elsewhere classified: Secondary | ICD-10-CM | POA: Diagnosis not present

## 2013-12-17 DIAGNOSIS — M79609 Pain in unspecified limb: Secondary | ICD-10-CM | POA: Diagnosis not present

## 2013-12-23 DIAGNOSIS — IMO0001 Reserved for inherently not codable concepts without codable children: Secondary | ICD-10-CM | POA: Diagnosis not present

## 2013-12-23 DIAGNOSIS — M5137 Other intervertebral disc degeneration, lumbosacral region: Secondary | ICD-10-CM | POA: Diagnosis not present

## 2013-12-23 DIAGNOSIS — M810 Age-related osteoporosis without current pathological fracture: Secondary | ICD-10-CM | POA: Diagnosis not present

## 2013-12-23 DIAGNOSIS — I10 Essential (primary) hypertension: Secondary | ICD-10-CM | POA: Diagnosis not present

## 2013-12-23 DIAGNOSIS — M48061 Spinal stenosis, lumbar region without neurogenic claudication: Secondary | ICD-10-CM | POA: Diagnosis not present

## 2013-12-23 DIAGNOSIS — G609 Hereditary and idiopathic neuropathy, unspecified: Secondary | ICD-10-CM | POA: Diagnosis not present

## 2013-12-23 DIAGNOSIS — E119 Type 2 diabetes mellitus without complications: Secondary | ICD-10-CM | POA: Diagnosis not present

## 2013-12-23 DIAGNOSIS — Z9181 History of falling: Secondary | ICD-10-CM | POA: Diagnosis not present

## 2013-12-23 DIAGNOSIS — M1710 Unilateral primary osteoarthritis, unspecified knee: Secondary | ICD-10-CM | POA: Diagnosis not present

## 2013-12-25 DIAGNOSIS — I658 Occlusion and stenosis of other precerebral arteries: Secondary | ICD-10-CM | POA: Diagnosis not present

## 2013-12-25 DIAGNOSIS — I6529 Occlusion and stenosis of unspecified carotid artery: Secondary | ICD-10-CM | POA: Diagnosis not present

## 2013-12-25 DIAGNOSIS — R011 Cardiac murmur, unspecified: Secondary | ICD-10-CM | POA: Diagnosis not present

## 2013-12-25 DIAGNOSIS — R0989 Other specified symptoms and signs involving the circulatory and respiratory systems: Secondary | ICD-10-CM | POA: Diagnosis not present

## 2013-12-27 DIAGNOSIS — M5137 Other intervertebral disc degeneration, lumbosacral region: Secondary | ICD-10-CM | POA: Diagnosis not present

## 2013-12-27 DIAGNOSIS — M48061 Spinal stenosis, lumbar region without neurogenic claudication: Secondary | ICD-10-CM | POA: Diagnosis not present

## 2013-12-27 DIAGNOSIS — E119 Type 2 diabetes mellitus without complications: Secondary | ICD-10-CM | POA: Diagnosis not present

## 2013-12-27 DIAGNOSIS — IMO0001 Reserved for inherently not codable concepts without codable children: Secondary | ICD-10-CM | POA: Diagnosis not present

## 2013-12-27 DIAGNOSIS — M1710 Unilateral primary osteoarthritis, unspecified knee: Secondary | ICD-10-CM | POA: Diagnosis not present

## 2013-12-27 DIAGNOSIS — G609 Hereditary and idiopathic neuropathy, unspecified: Secondary | ICD-10-CM | POA: Diagnosis not present

## 2013-12-30 DIAGNOSIS — E119 Type 2 diabetes mellitus without complications: Secondary | ICD-10-CM | POA: Diagnosis not present

## 2013-12-30 DIAGNOSIS — IMO0001 Reserved for inherently not codable concepts without codable children: Secondary | ICD-10-CM | POA: Diagnosis not present

## 2013-12-30 DIAGNOSIS — M5137 Other intervertebral disc degeneration, lumbosacral region: Secondary | ICD-10-CM | POA: Diagnosis not present

## 2013-12-30 DIAGNOSIS — M48061 Spinal stenosis, lumbar region without neurogenic claudication: Secondary | ICD-10-CM | POA: Diagnosis not present

## 2013-12-30 DIAGNOSIS — G609 Hereditary and idiopathic neuropathy, unspecified: Secondary | ICD-10-CM | POA: Diagnosis not present

## 2013-12-30 DIAGNOSIS — M1710 Unilateral primary osteoarthritis, unspecified knee: Secondary | ICD-10-CM | POA: Diagnosis not present

## 2013-12-31 ENCOUNTER — Inpatient Hospital Stay: Payer: Self-pay | Admitting: Internal Medicine

## 2013-12-31 DIAGNOSIS — R000 Tachycardia, unspecified: Secondary | ICD-10-CM | POA: Diagnosis not present

## 2013-12-31 DIAGNOSIS — J4489 Other specified chronic obstructive pulmonary disease: Secondary | ICD-10-CM | POA: Diagnosis not present

## 2013-12-31 DIAGNOSIS — I248 Other forms of acute ischemic heart disease: Secondary | ICD-10-CM | POA: Diagnosis not present

## 2013-12-31 DIAGNOSIS — J988 Other specified respiratory disorders: Secondary | ICD-10-CM | POA: Diagnosis not present

## 2013-12-31 DIAGNOSIS — I6529 Occlusion and stenosis of unspecified carotid artery: Secondary | ICD-10-CM | POA: Diagnosis not present

## 2013-12-31 DIAGNOSIS — R748 Abnormal levels of other serum enzymes: Secondary | ICD-10-CM | POA: Diagnosis not present

## 2013-12-31 DIAGNOSIS — F329 Major depressive disorder, single episode, unspecified: Secondary | ICD-10-CM | POA: Diagnosis present

## 2013-12-31 DIAGNOSIS — E1149 Type 2 diabetes mellitus with other diabetic neurological complication: Secondary | ICD-10-CM | POA: Diagnosis present

## 2013-12-31 DIAGNOSIS — F411 Generalized anxiety disorder: Secondary | ICD-10-CM | POA: Diagnosis present

## 2013-12-31 DIAGNOSIS — F0390 Unspecified dementia without behavioral disturbance: Secondary | ICD-10-CM | POA: Diagnosis present

## 2013-12-31 DIAGNOSIS — F3289 Other specified depressive episodes: Secondary | ICD-10-CM | POA: Diagnosis present

## 2013-12-31 DIAGNOSIS — Z7982 Long term (current) use of aspirin: Secondary | ICD-10-CM | POA: Diagnosis not present

## 2013-12-31 DIAGNOSIS — I739 Peripheral vascular disease, unspecified: Secondary | ICD-10-CM | POA: Diagnosis present

## 2013-12-31 DIAGNOSIS — M810 Age-related osteoporosis without current pathological fracture: Secondary | ICD-10-CM | POA: Diagnosis present

## 2013-12-31 DIAGNOSIS — R42 Dizziness and giddiness: Secondary | ICD-10-CM | POA: Diagnosis not present

## 2013-12-31 DIAGNOSIS — I4891 Unspecified atrial fibrillation: Secondary | ICD-10-CM | POA: Diagnosis not present

## 2013-12-31 DIAGNOSIS — I214 Non-ST elevation (NSTEMI) myocardial infarction: Secondary | ICD-10-CM | POA: Diagnosis not present

## 2013-12-31 DIAGNOSIS — Z87891 Personal history of nicotine dependence: Secondary | ICD-10-CM | POA: Diagnosis not present

## 2013-12-31 DIAGNOSIS — E7800 Pure hypercholesterolemia, unspecified: Secondary | ICD-10-CM | POA: Diagnosis present

## 2013-12-31 DIAGNOSIS — E785 Hyperlipidemia, unspecified: Secondary | ICD-10-CM | POA: Diagnosis present

## 2013-12-31 DIAGNOSIS — E039 Hypothyroidism, unspecified: Secondary | ICD-10-CM | POA: Diagnosis not present

## 2013-12-31 DIAGNOSIS — J449 Chronic obstructive pulmonary disease, unspecified: Secondary | ICD-10-CM | POA: Diagnosis not present

## 2013-12-31 DIAGNOSIS — I10 Essential (primary) hypertension: Secondary | ICD-10-CM | POA: Diagnosis not present

## 2013-12-31 DIAGNOSIS — I509 Heart failure, unspecified: Secondary | ICD-10-CM | POA: Diagnosis not present

## 2013-12-31 DIAGNOSIS — I5032 Chronic diastolic (congestive) heart failure: Secondary | ICD-10-CM | POA: Diagnosis present

## 2013-12-31 DIAGNOSIS — K219 Gastro-esophageal reflux disease without esophagitis: Secondary | ICD-10-CM | POA: Diagnosis present

## 2013-12-31 DIAGNOSIS — R404 Transient alteration of awareness: Secondary | ICD-10-CM | POA: Diagnosis not present

## 2013-12-31 DIAGNOSIS — E1142 Type 2 diabetes mellitus with diabetic polyneuropathy: Secondary | ICD-10-CM | POA: Diagnosis present

## 2013-12-31 LAB — CBC
HCT: 41.5 % (ref 35.0–47.0)
HGB: 13.6 g/dL (ref 12.0–16.0)
MCH: 32 pg (ref 26.0–34.0)
MCHC: 32.8 g/dL (ref 32.0–36.0)
MCV: 97 fL (ref 80–100)
Platelet: 244 10*3/uL (ref 150–440)
RBC: 4.27 10*6/uL (ref 3.80–5.20)
RDW: 13.2 % (ref 11.5–14.5)
WBC: 8 10*3/uL (ref 3.6–11.0)

## 2013-12-31 LAB — COMPREHENSIVE METABOLIC PANEL
ALBUMIN: 4 g/dL (ref 3.4–5.0)
AST: 24 U/L (ref 15–37)
Alkaline Phosphatase: 103 U/L
Anion Gap: 7 (ref 7–16)
BUN: 11 mg/dL (ref 7–18)
Bilirubin,Total: 0.5 mg/dL (ref 0.2–1.0)
CO2: 24 mmol/L (ref 21–32)
Calcium, Total: 9.2 mg/dL (ref 8.5–10.1)
Chloride: 104 mmol/L (ref 98–107)
Creatinine: 0.71 mg/dL (ref 0.60–1.30)
EGFR (Non-African Amer.): >60
Glucose: 136 mg/dL — ABNORMAL HIGH (ref 65–99)
Osmolality: 272 (ref 275–301)
POTASSIUM: 4.1 mmol/L (ref 3.5–5.1)
SGPT (ALT): 34 U/L (ref 12–78)
Sodium: 135 mmol/L — ABNORMAL LOW (ref 136–145)
TOTAL PROTEIN: 7.5 g/dL (ref 6.4–8.2)

## 2013-12-31 LAB — APTT: ACTIVATED PTT: 27.7 s (ref 23.6–35.9)

## 2013-12-31 LAB — LIPASE, BLOOD: LIPASE: 94 U/L (ref 73–393)

## 2013-12-31 LAB — PROTIME-INR
INR: 0.9
PROTHROMBIN TIME: 11.9 s (ref 11.5–14.7)

## 2013-12-31 LAB — TROPONIN I
TROPONIN-I: 0.11 ng/mL — AB
Troponin-I: 0.13 ng/mL — ABNORMAL HIGH

## 2013-12-31 LAB — MAGNESIUM: Magnesium: 2.2 mg/dL

## 2014-01-01 LAB — URINALYSIS, COMPLETE
BLOOD: NEGATIVE
Bacteria: NONE SEEN
Bilirubin,UR: NEGATIVE
KETONE: NEGATIVE
Leukocyte Esterase: NEGATIVE
Nitrite: NEGATIVE
PROTEIN: NEGATIVE
Ph: 7 (ref 4.5–8.0)
RBC, UR: NONE SEEN /HPF (ref 0–5)
Specific Gravity: 1.006 (ref 1.003–1.030)
Squamous Epithelial: NONE SEEN
WBC UR: <1 /HPF (ref 0–5)

## 2014-01-01 LAB — TROPONIN I: Troponin-I: 0.14 ng/mL — ABNORMAL HIGH

## 2014-01-01 LAB — BASIC METABOLIC PANEL
Anion Gap: 6 — ABNORMAL LOW (ref 7–16)
BUN: 9 mg/dL (ref 7–18)
CHLORIDE: 108 mmol/L — AB (ref 98–107)
CO2: 26 mmol/L (ref 21–32)
CREATININE: 0.72 mg/dL (ref 0.60–1.30)
Calcium, Total: 8.4 mg/dL — ABNORMAL LOW (ref 8.5–10.1)
EGFR (African American): >60
EGFR (Non-African Amer.): >60
Glucose: 124 mg/dL — ABNORMAL HIGH (ref 65–99)
Osmolality: 280 (ref 275–301)
Potassium: 3.9 mmol/L (ref 3.5–5.1)
SODIUM: 140 mmol/L (ref 136–145)

## 2014-01-01 LAB — CBC WITH DIFFERENTIAL/PLATELET
BASOS ABS: 0.1 10*3/uL (ref 0.0–0.1)
Basophil %: 0.7 %
EOS ABS: 0.1 10*3/uL (ref 0.0–0.7)
Eosinophil %: 1.6 %
HCT: 35.8 % (ref 35.0–47.0)
HGB: 11.6 g/dL — AB (ref 12.0–16.0)
Lymphocyte #: 1.6 10*3/uL (ref 1.0–3.6)
Lymphocyte %: 22.9 %
MCH: 31.5 pg (ref 26.0–34.0)
MCHC: 32.3 g/dL (ref 32.0–36.0)
MCV: 98 fL (ref 80–100)
MONO ABS: 0.6 x10 3/mm (ref 0.2–0.9)
MONOS PCT: 8.4 %
Neutrophil #: 4.6 10*3/uL (ref 1.4–6.5)
Neutrophil %: 66.4 %
Platelet: 211 10*3/uL (ref 150–440)
RBC: 3.67 10*6/uL — AB (ref 3.80–5.20)
RDW: 13 % (ref 11.5–14.5)
WBC: 6.9 10*3/uL (ref 3.6–11.0)

## 2014-01-01 LAB — TSH: THYROID STIMULATING HORM: 0.214 u[IU]/mL — AB

## 2014-01-01 LAB — MAGNESIUM: Magnesium: 2.1 mg/dL

## 2014-01-02 LAB — T4, FREE: FREE THYROXINE: 1.3 ng/dL (ref 0.76–1.46)

## 2014-01-07 DIAGNOSIS — G609 Hereditary and idiopathic neuropathy, unspecified: Secondary | ICD-10-CM | POA: Diagnosis not present

## 2014-01-07 DIAGNOSIS — M5137 Other intervertebral disc degeneration, lumbosacral region: Secondary | ICD-10-CM | POA: Diagnosis not present

## 2014-01-07 DIAGNOSIS — M1710 Unilateral primary osteoarthritis, unspecified knee: Secondary | ICD-10-CM | POA: Diagnosis not present

## 2014-01-07 DIAGNOSIS — E119 Type 2 diabetes mellitus without complications: Secondary | ICD-10-CM | POA: Diagnosis not present

## 2014-01-07 DIAGNOSIS — IMO0001 Reserved for inherently not codable concepts without codable children: Secondary | ICD-10-CM | POA: Diagnosis not present

## 2014-01-07 DIAGNOSIS — M48061 Spinal stenosis, lumbar region without neurogenic claudication: Secondary | ICD-10-CM | POA: Diagnosis not present

## 2014-01-11 ENCOUNTER — Inpatient Hospital Stay: Payer: Self-pay | Admitting: Internal Medicine

## 2014-01-11 DIAGNOSIS — M1710 Unilateral primary osteoarthritis, unspecified knee: Secondary | ICD-10-CM | POA: Diagnosis not present

## 2014-01-11 DIAGNOSIS — E039 Hypothyroidism, unspecified: Secondary | ICD-10-CM | POA: Diagnosis present

## 2014-01-11 DIAGNOSIS — M4800 Spinal stenosis, site unspecified: Secondary | ICD-10-CM | POA: Diagnosis present

## 2014-01-11 DIAGNOSIS — I5043 Acute on chronic combined systolic (congestive) and diastolic (congestive) heart failure: Secondary | ICD-10-CM | POA: Diagnosis not present

## 2014-01-11 DIAGNOSIS — J9610 Chronic respiratory failure, unspecified whether with hypoxia or hypercapnia: Secondary | ICD-10-CM | POA: Diagnosis present

## 2014-01-11 DIAGNOSIS — R079 Chest pain, unspecified: Secondary | ICD-10-CM | POA: Diagnosis not present

## 2014-01-11 DIAGNOSIS — R0602 Shortness of breath: Secondary | ICD-10-CM | POA: Diagnosis not present

## 2014-01-11 DIAGNOSIS — I739 Peripheral vascular disease, unspecified: Secondary | ICD-10-CM | POA: Diagnosis present

## 2014-01-11 DIAGNOSIS — N814 Uterovaginal prolapse, unspecified: Secondary | ICD-10-CM | POA: Diagnosis present

## 2014-01-11 DIAGNOSIS — I509 Heart failure, unspecified: Secondary | ICD-10-CM | POA: Diagnosis not present

## 2014-01-11 DIAGNOSIS — I5023 Acute on chronic systolic (congestive) heart failure: Secondary | ICD-10-CM | POA: Diagnosis not present

## 2014-01-11 DIAGNOSIS — R0989 Other specified symptoms and signs involving the circulatory and respiratory systems: Secondary | ICD-10-CM | POA: Diagnosis not present

## 2014-01-11 DIAGNOSIS — M5137 Other intervertebral disc degeneration, lumbosacral region: Secondary | ICD-10-CM | POA: Diagnosis not present

## 2014-01-11 DIAGNOSIS — M810 Age-related osteoporosis without current pathological fracture: Secondary | ICD-10-CM | POA: Diagnosis present

## 2014-01-11 DIAGNOSIS — Z9981 Dependence on supplemental oxygen: Secondary | ICD-10-CM | POA: Diagnosis not present

## 2014-01-11 DIAGNOSIS — J189 Pneumonia, unspecified organism: Secondary | ICD-10-CM | POA: Diagnosis not present

## 2014-01-11 DIAGNOSIS — IMO0001 Reserved for inherently not codable concepts without codable children: Secondary | ICD-10-CM | POA: Diagnosis not present

## 2014-01-11 DIAGNOSIS — R5381 Other malaise: Secondary | ICD-10-CM | POA: Diagnosis present

## 2014-01-11 DIAGNOSIS — I10 Essential (primary) hypertension: Secondary | ICD-10-CM | POA: Diagnosis not present

## 2014-01-11 DIAGNOSIS — R0902 Hypoxemia: Secondary | ICD-10-CM | POA: Diagnosis not present

## 2014-01-11 DIAGNOSIS — J4489 Other specified chronic obstructive pulmonary disease: Secondary | ICD-10-CM | POA: Diagnosis not present

## 2014-01-11 DIAGNOSIS — Z5189 Encounter for other specified aftercare: Secondary | ICD-10-CM | POA: Diagnosis not present

## 2014-01-11 DIAGNOSIS — I701 Atherosclerosis of renal artery: Secondary | ICD-10-CM | POA: Diagnosis present

## 2014-01-11 DIAGNOSIS — E119 Type 2 diabetes mellitus without complications: Secondary | ICD-10-CM | POA: Diagnosis present

## 2014-01-11 DIAGNOSIS — Z889 Allergy status to unspecified drugs, medicaments and biological substances status: Secondary | ICD-10-CM | POA: Diagnosis not present

## 2014-01-11 DIAGNOSIS — F341 Dysthymic disorder: Secondary | ICD-10-CM | POA: Diagnosis present

## 2014-01-11 DIAGNOSIS — I4891 Unspecified atrial fibrillation: Secondary | ICD-10-CM | POA: Diagnosis not present

## 2014-01-11 DIAGNOSIS — Z9181 History of falling: Secondary | ICD-10-CM | POA: Diagnosis not present

## 2014-01-11 DIAGNOSIS — G609 Hereditary and idiopathic neuropathy, unspecified: Secondary | ICD-10-CM | POA: Diagnosis present

## 2014-01-11 DIAGNOSIS — J9600 Acute respiratory failure, unspecified whether with hypoxia or hypercapnia: Secondary | ICD-10-CM | POA: Diagnosis not present

## 2014-01-11 DIAGNOSIS — I6529 Occlusion and stenosis of unspecified carotid artery: Secondary | ICD-10-CM | POA: Diagnosis present

## 2014-01-11 DIAGNOSIS — M6281 Muscle weakness (generalized): Secondary | ICD-10-CM | POA: Diagnosis not present

## 2014-01-11 DIAGNOSIS — M48061 Spinal stenosis, lumbar region without neurogenic claudication: Secondary | ICD-10-CM | POA: Diagnosis not present

## 2014-01-11 DIAGNOSIS — E785 Hyperlipidemia, unspecified: Secondary | ICD-10-CM | POA: Diagnosis present

## 2014-01-11 DIAGNOSIS — R0789 Other chest pain: Secondary | ICD-10-CM | POA: Diagnosis not present

## 2014-01-11 DIAGNOSIS — K219 Gastro-esophageal reflux disease without esophagitis: Secondary | ICD-10-CM | POA: Diagnosis present

## 2014-01-11 DIAGNOSIS — J441 Chronic obstructive pulmonary disease with (acute) exacerbation: Secondary | ICD-10-CM | POA: Diagnosis not present

## 2014-01-11 DIAGNOSIS — J449 Chronic obstructive pulmonary disease, unspecified: Secondary | ICD-10-CM | POA: Diagnosis not present

## 2014-01-11 DIAGNOSIS — A419 Sepsis, unspecified organism: Secondary | ICD-10-CM | POA: Diagnosis not present

## 2014-01-11 LAB — BASIC METABOLIC PANEL
Anion Gap: 7 (ref 7–16)
BUN: 15 mg/dL (ref 7–18)
CHLORIDE: 98 mmol/L (ref 98–107)
CREATININE: 0.93 mg/dL (ref 0.60–1.30)
Calcium, Total: 8.7 mg/dL (ref 8.5–10.1)
Co2: 26 mmol/L (ref 21–32)
EGFR (African American): >60
GFR CALC NON AF AMER: 57 — AB
GLUCOSE: 157 mg/dL — AB (ref 65–99)
Osmolality: 267 (ref 275–301)
POTASSIUM: 4.2 mmol/L (ref 3.5–5.1)
Sodium: 131 mmol/L — ABNORMAL LOW (ref 136–145)

## 2014-01-11 LAB — CBC WITH DIFFERENTIAL/PLATELET
BASOS PCT: 0.4 %
Basophil #: 0.1 10*3/uL (ref 0.0–0.1)
EOS PCT: 0.5 %
Eosinophil #: 0.1 10*3/uL (ref 0.0–0.7)
HCT: 35.4 % (ref 35.0–47.0)
HGB: 11.8 g/dL — ABNORMAL LOW (ref 12.0–16.0)
LYMPHS ABS: 2 10*3/uL (ref 1.0–3.6)
Lymphocyte %: 13 %
MCH: 32.3 pg (ref 26.0–34.0)
MCHC: 33.4 g/dL (ref 32.0–36.0)
MCV: 97 fL (ref 80–100)
MONO ABS: 1.2 x10 3/mm — AB (ref 0.2–0.9)
Monocyte %: 7.5 %
NEUTROS PCT: 78.6 %
Neutrophil #: 12.2 10*3/uL — ABNORMAL HIGH (ref 1.4–6.5)
Platelet: 214 10*3/uL (ref 150–440)
RBC: 3.65 10*6/uL — ABNORMAL LOW (ref 3.80–5.20)
RDW: 12.9 % (ref 11.5–14.5)
WBC: 15.5 10*3/uL — ABNORMAL HIGH (ref 3.6–11.0)

## 2014-01-11 LAB — PRO B NATRIURETIC PEPTIDE: B-TYPE NATIURETIC PEPTID: 774 pg/mL — AB (ref 0–450)

## 2014-01-11 LAB — TROPONIN I

## 2014-01-12 LAB — CBC WITH DIFFERENTIAL/PLATELET
BASOS PCT: 0.1 %
Basophil #: 0 10*3/uL (ref 0.0–0.1)
Eosinophil #: 0 10*3/uL (ref 0.0–0.7)
Eosinophil %: 0 %
HCT: 34.9 % — AB (ref 35.0–47.0)
HGB: 11.6 g/dL — ABNORMAL LOW (ref 12.0–16.0)
Lymphocyte #: 0.6 10*3/uL — ABNORMAL LOW (ref 1.0–3.6)
Lymphocyte %: 5.6 %
MCH: 31.9 pg (ref 26.0–34.0)
MCHC: 33.1 g/dL (ref 32.0–36.0)
MCV: 96 fL (ref 80–100)
Monocyte #: 0.3 x10 3/mm (ref 0.2–0.9)
Monocyte %: 2.7 %
Neutrophil #: 10.1 10*3/uL — ABNORMAL HIGH (ref 1.4–6.5)
Neutrophil %: 91.6 %
Platelet: 201 10*3/uL (ref 150–440)
RBC: 3.62 10*6/uL — AB (ref 3.80–5.20)
RDW: 13.2 % (ref 11.5–14.5)
WBC: 11 10*3/uL (ref 3.6–11.0)

## 2014-01-12 LAB — BASIC METABOLIC PANEL
Anion Gap: 7 (ref 7–16)
BUN: 15 mg/dL (ref 7–18)
CREATININE: 1.01 mg/dL (ref 0.60–1.30)
Calcium, Total: 8.7 mg/dL (ref 8.5–10.1)
Chloride: 101 mmol/L (ref 98–107)
Co2: 26 mmol/L (ref 21–32)
GFR CALC NON AF AMER: 52 — AB
Glucose: 266 mg/dL — ABNORMAL HIGH (ref 65–99)
Osmolality: 278 (ref 275–301)
Potassium: 3.6 mmol/L (ref 3.5–5.1)
Sodium: 134 mmol/L — ABNORMAL LOW (ref 136–145)

## 2014-01-13 LAB — BASIC METABOLIC PANEL
ANION GAP: 8 (ref 7–16)
BUN: 17 mg/dL (ref 7–18)
CHLORIDE: 95 mmol/L — AB (ref 98–107)
CO2: 27 mmol/L (ref 21–32)
Calcium, Total: 8.3 mg/dL — ABNORMAL LOW (ref 8.5–10.1)
Creatinine: 1 mg/dL (ref 0.60–1.30)
GFR CALC NON AF AMER: 52 — AB
GLUCOSE: 269 mg/dL — AB (ref 65–99)
Osmolality: 272 (ref 275–301)
Potassium: 3.8 mmol/L (ref 3.5–5.1)
SODIUM: 130 mmol/L — AB (ref 136–145)

## 2014-01-13 LAB — TROPONIN I: Troponin-I: <0.02 ng/mL

## 2014-01-14 LAB — BASIC METABOLIC PANEL
ANION GAP: 5 — AB (ref 7–16)
BUN: 15 mg/dL (ref 7–18)
CHLORIDE: 98 mmol/L (ref 98–107)
CO2: 31 mmol/L (ref 21–32)
Calcium, Total: 8.7 mg/dL (ref 8.5–10.1)
Creatinine: 1.01 mg/dL (ref 0.60–1.30)
EGFR (African American): >60
EGFR (Non-African Amer.): 52 — ABNORMAL LOW
Glucose: 244 mg/dL — ABNORMAL HIGH (ref 65–99)
OSMOLALITY: 277 (ref 275–301)
Potassium: 3.4 mmol/L — ABNORMAL LOW (ref 3.5–5.1)
SODIUM: 134 mmol/L — AB (ref 136–145)

## 2014-01-15 DIAGNOSIS — I5033 Acute on chronic diastolic (congestive) heart failure: Secondary | ICD-10-CM | POA: Diagnosis not present

## 2014-01-15 DIAGNOSIS — R5383 Other fatigue: Secondary | ICD-10-CM | POA: Diagnosis not present

## 2014-01-15 DIAGNOSIS — Z5189 Encounter for other specified aftercare: Secondary | ICD-10-CM | POA: Diagnosis not present

## 2014-01-15 DIAGNOSIS — R0609 Other forms of dyspnea: Secondary | ICD-10-CM | POA: Diagnosis not present

## 2014-01-15 DIAGNOSIS — I4891 Unspecified atrial fibrillation: Secondary | ICD-10-CM | POA: Diagnosis not present

## 2014-01-15 DIAGNOSIS — I5023 Acute on chronic systolic (congestive) heart failure: Secondary | ICD-10-CM | POA: Diagnosis not present

## 2014-01-15 DIAGNOSIS — J441 Chronic obstructive pulmonary disease with (acute) exacerbation: Secondary | ICD-10-CM | POA: Diagnosis not present

## 2014-01-15 DIAGNOSIS — J449 Chronic obstructive pulmonary disease, unspecified: Secondary | ICD-10-CM | POA: Diagnosis not present

## 2014-01-15 DIAGNOSIS — R5381 Other malaise: Secondary | ICD-10-CM | POA: Diagnosis not present

## 2014-01-15 DIAGNOSIS — Z9981 Dependence on supplemental oxygen: Secondary | ICD-10-CM | POA: Diagnosis not present

## 2014-01-15 DIAGNOSIS — I509 Heart failure, unspecified: Secondary | ICD-10-CM | POA: Diagnosis not present

## 2014-01-15 DIAGNOSIS — I10 Essential (primary) hypertension: Secondary | ICD-10-CM | POA: Diagnosis not present

## 2014-01-15 DIAGNOSIS — Z515 Encounter for palliative care: Secondary | ICD-10-CM | POA: Diagnosis not present

## 2014-01-15 DIAGNOSIS — R609 Edema, unspecified: Secondary | ICD-10-CM | POA: Diagnosis not present

## 2014-01-15 DIAGNOSIS — M6281 Muscle weakness (generalized): Secondary | ICD-10-CM | POA: Diagnosis not present

## 2014-01-15 DIAGNOSIS — J45901 Unspecified asthma with (acute) exacerbation: Secondary | ICD-10-CM | POA: Diagnosis not present

## 2014-01-15 DIAGNOSIS — J189 Pneumonia, unspecified organism: Secondary | ICD-10-CM | POA: Diagnosis not present

## 2014-01-15 DIAGNOSIS — J9600 Acute respiratory failure, unspecified whether with hypoxia or hypercapnia: Secondary | ICD-10-CM | POA: Diagnosis not present

## 2014-01-15 DIAGNOSIS — A419 Sepsis, unspecified organism: Secondary | ICD-10-CM | POA: Diagnosis not present

## 2014-01-15 LAB — BASIC METABOLIC PANEL
Anion Gap: 9 (ref 7–16)
BUN: 17 mg/dL (ref 7–18)
CALCIUM: 8.7 mg/dL (ref 8.5–10.1)
CHLORIDE: 99 mmol/L (ref 98–107)
CREATININE: 0.9 mg/dL (ref 0.60–1.30)
Co2: 27 mmol/L (ref 21–32)
GFR CALC NON AF AMER: 60 — AB
GLUCOSE: 257 mg/dL — AB (ref 65–99)
OSMOLALITY: 280 (ref 275–301)
POTASSIUM: 3.5 mmol/L (ref 3.5–5.1)
Sodium: 135 mmol/L — ABNORMAL LOW (ref 136–145)

## 2014-01-16 ENCOUNTER — Encounter: Payer: Self-pay | Admitting: Internal Medicine

## 2014-01-16 DIAGNOSIS — J189 Pneumonia, unspecified organism: Secondary | ICD-10-CM | POA: Diagnosis not present

## 2014-01-16 DIAGNOSIS — I509 Heart failure, unspecified: Secondary | ICD-10-CM | POA: Diagnosis not present

## 2014-01-16 DIAGNOSIS — J449 Chronic obstructive pulmonary disease, unspecified: Secondary | ICD-10-CM | POA: Diagnosis not present

## 2014-01-16 LAB — CULTURE, BLOOD (SINGLE)

## 2014-01-26 LAB — URINALYSIS, COMPLETE
BLOOD: NEGATIVE
Bacteria: NONE SEEN
Bilirubin,UR: NEGATIVE
Ketone: NEGATIVE
Leukocyte Esterase: NEGATIVE
Nitrite: NEGATIVE
PH: 7 (ref 4.5–8.0)
Protein: NEGATIVE
RBC, UR: NONE SEEN /HPF (ref 0–5)
Specific Gravity: 1.011 (ref 1.003–1.030)
WBC UR: 1 /HPF (ref 0–5)

## 2014-01-27 DIAGNOSIS — I5033 Acute on chronic diastolic (congestive) heart failure: Secondary | ICD-10-CM | POA: Diagnosis not present

## 2014-01-27 DIAGNOSIS — R609 Edema, unspecified: Secondary | ICD-10-CM | POA: Diagnosis not present

## 2014-01-27 DIAGNOSIS — R0609 Other forms of dyspnea: Secondary | ICD-10-CM | POA: Diagnosis not present

## 2014-01-27 DIAGNOSIS — I10 Essential (primary) hypertension: Secondary | ICD-10-CM | POA: Diagnosis not present

## 2014-01-28 LAB — URINE CULTURE

## 2014-01-29 LAB — URINALYSIS, COMPLETE
BLOOD: NEGATIVE
Bacteria: NONE SEEN
Bilirubin,UR: NEGATIVE
Glucose,UR: 50 mg/dL (ref 0–75)
KETONE: NEGATIVE
Leukocyte Esterase: NEGATIVE
Nitrite: NEGATIVE
Ph: 6 (ref 4.5–8.0)
Protein: NEGATIVE
Specific Gravity: 1.009 (ref 1.003–1.030)
WBC UR: 1 /HPF (ref 0–5)

## 2014-01-30 LAB — URINE CULTURE

## 2014-01-31 ENCOUNTER — Ambulatory Visit: Payer: Self-pay | Admitting: Nurse Practitioner

## 2014-01-31 ENCOUNTER — Encounter: Payer: Self-pay | Admitting: Internal Medicine

## 2014-02-10 DIAGNOSIS — Z515 Encounter for palliative care: Secondary | ICD-10-CM | POA: Diagnosis not present

## 2014-02-10 DIAGNOSIS — J449 Chronic obstructive pulmonary disease, unspecified: Secondary | ICD-10-CM | POA: Diagnosis not present

## 2014-02-14 DIAGNOSIS — I10 Essential (primary) hypertension: Secondary | ICD-10-CM | POA: Diagnosis not present

## 2014-02-14 DIAGNOSIS — I4891 Unspecified atrial fibrillation: Secondary | ICD-10-CM | POA: Diagnosis not present

## 2014-02-14 DIAGNOSIS — F329 Major depressive disorder, single episode, unspecified: Secondary | ICD-10-CM | POA: Diagnosis not present

## 2014-02-14 DIAGNOSIS — J189 Pneumonia, unspecified organism: Secondary | ICD-10-CM | POA: Diagnosis not present

## 2014-02-14 DIAGNOSIS — M4800 Spinal stenosis, site unspecified: Secondary | ICD-10-CM | POA: Diagnosis not present

## 2014-02-14 DIAGNOSIS — I509 Heart failure, unspecified: Secondary | ICD-10-CM | POA: Diagnosis not present

## 2014-02-14 DIAGNOSIS — J449 Chronic obstructive pulmonary disease, unspecified: Secondary | ICD-10-CM | POA: Diagnosis not present

## 2014-02-14 DIAGNOSIS — F3289 Other specified depressive episodes: Secondary | ICD-10-CM | POA: Diagnosis not present

## 2014-02-14 DIAGNOSIS — I519 Heart disease, unspecified: Secondary | ICD-10-CM | POA: Diagnosis not present

## 2014-02-14 DIAGNOSIS — E119 Type 2 diabetes mellitus without complications: Secondary | ICD-10-CM | POA: Diagnosis not present

## 2014-02-14 DIAGNOSIS — Z8701 Personal history of pneumonia (recurrent): Secondary | ICD-10-CM | POA: Diagnosis not present

## 2014-02-18 DIAGNOSIS — I10 Essential (primary) hypertension: Secondary | ICD-10-CM | POA: Diagnosis not present

## 2014-02-18 DIAGNOSIS — F329 Major depressive disorder, single episode, unspecified: Secondary | ICD-10-CM | POA: Diagnosis not present

## 2014-02-18 DIAGNOSIS — M4800 Spinal stenosis, site unspecified: Secondary | ICD-10-CM | POA: Diagnosis not present

## 2014-02-18 DIAGNOSIS — I509 Heart failure, unspecified: Secondary | ICD-10-CM | POA: Diagnosis not present

## 2014-02-18 DIAGNOSIS — I4891 Unspecified atrial fibrillation: Secondary | ICD-10-CM | POA: Diagnosis not present

## 2014-02-18 DIAGNOSIS — F3289 Other specified depressive episodes: Secondary | ICD-10-CM | POA: Diagnosis not present

## 2014-02-18 DIAGNOSIS — E119 Type 2 diabetes mellitus without complications: Secondary | ICD-10-CM | POA: Diagnosis not present

## 2014-02-19 DIAGNOSIS — I4891 Unspecified atrial fibrillation: Secondary | ICD-10-CM | POA: Diagnosis not present

## 2014-02-19 DIAGNOSIS — I509 Heart failure, unspecified: Secondary | ICD-10-CM | POA: Diagnosis not present

## 2014-02-19 DIAGNOSIS — E119 Type 2 diabetes mellitus without complications: Secondary | ICD-10-CM | POA: Diagnosis not present

## 2014-02-19 DIAGNOSIS — I10 Essential (primary) hypertension: Secondary | ICD-10-CM | POA: Diagnosis not present

## 2014-02-19 DIAGNOSIS — F329 Major depressive disorder, single episode, unspecified: Secondary | ICD-10-CM | POA: Diagnosis not present

## 2014-02-19 DIAGNOSIS — F3289 Other specified depressive episodes: Secondary | ICD-10-CM | POA: Diagnosis not present

## 2014-02-19 DIAGNOSIS — M4800 Spinal stenosis, site unspecified: Secondary | ICD-10-CM | POA: Diagnosis not present

## 2014-02-20 DIAGNOSIS — F329 Major depressive disorder, single episode, unspecified: Secondary | ICD-10-CM | POA: Diagnosis not present

## 2014-02-20 DIAGNOSIS — E119 Type 2 diabetes mellitus without complications: Secondary | ICD-10-CM | POA: Diagnosis not present

## 2014-02-20 DIAGNOSIS — F3289 Other specified depressive episodes: Secondary | ICD-10-CM | POA: Diagnosis not present

## 2014-02-20 DIAGNOSIS — I4891 Unspecified atrial fibrillation: Secondary | ICD-10-CM | POA: Diagnosis not present

## 2014-02-20 DIAGNOSIS — I509 Heart failure, unspecified: Secondary | ICD-10-CM | POA: Diagnosis not present

## 2014-02-20 DIAGNOSIS — M4800 Spinal stenosis, site unspecified: Secondary | ICD-10-CM | POA: Diagnosis not present

## 2014-02-20 DIAGNOSIS — I10 Essential (primary) hypertension: Secondary | ICD-10-CM | POA: Diagnosis not present

## 2014-02-25 DIAGNOSIS — F3289 Other specified depressive episodes: Secondary | ICD-10-CM | POA: Diagnosis not present

## 2014-02-25 DIAGNOSIS — E119 Type 2 diabetes mellitus without complications: Secondary | ICD-10-CM | POA: Diagnosis not present

## 2014-02-25 DIAGNOSIS — I509 Heart failure, unspecified: Secondary | ICD-10-CM | POA: Diagnosis not present

## 2014-02-25 DIAGNOSIS — I10 Essential (primary) hypertension: Secondary | ICD-10-CM | POA: Diagnosis not present

## 2014-02-25 DIAGNOSIS — I4891 Unspecified atrial fibrillation: Secondary | ICD-10-CM | POA: Diagnosis not present

## 2014-02-25 DIAGNOSIS — F329 Major depressive disorder, single episode, unspecified: Secondary | ICD-10-CM | POA: Diagnosis not present

## 2014-02-25 DIAGNOSIS — M4800 Spinal stenosis, site unspecified: Secondary | ICD-10-CM | POA: Diagnosis not present

## 2014-02-26 DIAGNOSIS — F3289 Other specified depressive episodes: Secondary | ICD-10-CM | POA: Diagnosis not present

## 2014-02-26 DIAGNOSIS — F329 Major depressive disorder, single episode, unspecified: Secondary | ICD-10-CM | POA: Diagnosis not present

## 2014-02-26 DIAGNOSIS — I10 Essential (primary) hypertension: Secondary | ICD-10-CM | POA: Diagnosis not present

## 2014-02-26 DIAGNOSIS — I4891 Unspecified atrial fibrillation: Secondary | ICD-10-CM | POA: Diagnosis not present

## 2014-02-26 DIAGNOSIS — E119 Type 2 diabetes mellitus without complications: Secondary | ICD-10-CM | POA: Diagnosis not present

## 2014-02-26 DIAGNOSIS — I509 Heart failure, unspecified: Secondary | ICD-10-CM | POA: Diagnosis not present

## 2014-02-26 DIAGNOSIS — M4800 Spinal stenosis, site unspecified: Secondary | ICD-10-CM | POA: Diagnosis not present

## 2014-02-27 DIAGNOSIS — I509 Heart failure, unspecified: Secondary | ICD-10-CM | POA: Diagnosis not present

## 2014-02-27 DIAGNOSIS — I4891 Unspecified atrial fibrillation: Secondary | ICD-10-CM | POA: Diagnosis not present

## 2014-02-27 DIAGNOSIS — F329 Major depressive disorder, single episode, unspecified: Secondary | ICD-10-CM | POA: Diagnosis not present

## 2014-02-27 DIAGNOSIS — F3289 Other specified depressive episodes: Secondary | ICD-10-CM | POA: Diagnosis not present

## 2014-02-27 DIAGNOSIS — M4800 Spinal stenosis, site unspecified: Secondary | ICD-10-CM | POA: Diagnosis not present

## 2014-02-27 DIAGNOSIS — E119 Type 2 diabetes mellitus without complications: Secondary | ICD-10-CM | POA: Diagnosis not present

## 2014-02-27 DIAGNOSIS — I10 Essential (primary) hypertension: Secondary | ICD-10-CM | POA: Diagnosis not present

## 2014-02-28 DIAGNOSIS — F329 Major depressive disorder, single episode, unspecified: Secondary | ICD-10-CM | POA: Diagnosis not present

## 2014-02-28 DIAGNOSIS — I10 Essential (primary) hypertension: Secondary | ICD-10-CM | POA: Diagnosis not present

## 2014-02-28 DIAGNOSIS — E119 Type 2 diabetes mellitus without complications: Secondary | ICD-10-CM | POA: Diagnosis not present

## 2014-02-28 DIAGNOSIS — I4891 Unspecified atrial fibrillation: Secondary | ICD-10-CM | POA: Diagnosis not present

## 2014-02-28 DIAGNOSIS — I509 Heart failure, unspecified: Secondary | ICD-10-CM | POA: Diagnosis not present

## 2014-02-28 DIAGNOSIS — F3289 Other specified depressive episodes: Secondary | ICD-10-CM | POA: Diagnosis not present

## 2014-02-28 DIAGNOSIS — M4800 Spinal stenosis, site unspecified: Secondary | ICD-10-CM | POA: Diagnosis not present

## 2014-03-03 DIAGNOSIS — I4891 Unspecified atrial fibrillation: Secondary | ICD-10-CM | POA: Diagnosis not present

## 2014-03-03 DIAGNOSIS — I10 Essential (primary) hypertension: Secondary | ICD-10-CM | POA: Diagnosis not present

## 2014-03-03 DIAGNOSIS — G609 Hereditary and idiopathic neuropathy, unspecified: Secondary | ICD-10-CM | POA: Diagnosis not present

## 2014-03-03 DIAGNOSIS — E119 Type 2 diabetes mellitus without complications: Secondary | ICD-10-CM | POA: Diagnosis not present

## 2014-03-03 DIAGNOSIS — I509 Heart failure, unspecified: Secondary | ICD-10-CM | POA: Diagnosis not present

## 2014-03-03 DIAGNOSIS — F329 Major depressive disorder, single episode, unspecified: Secondary | ICD-10-CM | POA: Diagnosis not present

## 2014-03-03 DIAGNOSIS — F3289 Other specified depressive episodes: Secondary | ICD-10-CM | POA: Diagnosis not present

## 2014-03-03 DIAGNOSIS — M4800 Spinal stenosis, site unspecified: Secondary | ICD-10-CM | POA: Diagnosis not present

## 2014-03-03 DIAGNOSIS — J449 Chronic obstructive pulmonary disease, unspecified: Secondary | ICD-10-CM | POA: Diagnosis not present

## 2014-03-03 DIAGNOSIS — R609 Edema, unspecified: Secondary | ICD-10-CM | POA: Diagnosis not present

## 2014-03-04 DIAGNOSIS — F329 Major depressive disorder, single episode, unspecified: Secondary | ICD-10-CM | POA: Diagnosis not present

## 2014-03-04 DIAGNOSIS — I509 Heart failure, unspecified: Secondary | ICD-10-CM | POA: Diagnosis not present

## 2014-03-04 DIAGNOSIS — M4800 Spinal stenosis, site unspecified: Secondary | ICD-10-CM | POA: Diagnosis not present

## 2014-03-04 DIAGNOSIS — I10 Essential (primary) hypertension: Secondary | ICD-10-CM | POA: Diagnosis not present

## 2014-03-04 DIAGNOSIS — I4891 Unspecified atrial fibrillation: Secondary | ICD-10-CM | POA: Diagnosis not present

## 2014-03-04 DIAGNOSIS — E119 Type 2 diabetes mellitus without complications: Secondary | ICD-10-CM | POA: Diagnosis not present

## 2014-03-04 DIAGNOSIS — F3289 Other specified depressive episodes: Secondary | ICD-10-CM | POA: Diagnosis not present

## 2014-03-05 DIAGNOSIS — I509 Heart failure, unspecified: Secondary | ICD-10-CM | POA: Diagnosis not present

## 2014-03-05 DIAGNOSIS — M4800 Spinal stenosis, site unspecified: Secondary | ICD-10-CM | POA: Diagnosis not present

## 2014-03-05 DIAGNOSIS — I10 Essential (primary) hypertension: Secondary | ICD-10-CM | POA: Diagnosis not present

## 2014-03-05 DIAGNOSIS — E119 Type 2 diabetes mellitus without complications: Secondary | ICD-10-CM | POA: Diagnosis not present

## 2014-03-05 DIAGNOSIS — I4891 Unspecified atrial fibrillation: Secondary | ICD-10-CM | POA: Diagnosis not present

## 2014-03-05 DIAGNOSIS — F329 Major depressive disorder, single episode, unspecified: Secondary | ICD-10-CM | POA: Diagnosis not present

## 2014-03-05 DIAGNOSIS — F3289 Other specified depressive episodes: Secondary | ICD-10-CM | POA: Diagnosis not present

## 2014-03-06 DIAGNOSIS — I4891 Unspecified atrial fibrillation: Secondary | ICD-10-CM | POA: Diagnosis not present

## 2014-03-06 DIAGNOSIS — F3289 Other specified depressive episodes: Secondary | ICD-10-CM | POA: Diagnosis not present

## 2014-03-06 DIAGNOSIS — F329 Major depressive disorder, single episode, unspecified: Secondary | ICD-10-CM | POA: Diagnosis not present

## 2014-03-06 DIAGNOSIS — M4800 Spinal stenosis, site unspecified: Secondary | ICD-10-CM | POA: Diagnosis not present

## 2014-03-06 DIAGNOSIS — E119 Type 2 diabetes mellitus without complications: Secondary | ICD-10-CM | POA: Diagnosis not present

## 2014-03-06 DIAGNOSIS — I509 Heart failure, unspecified: Secondary | ICD-10-CM | POA: Diagnosis not present

## 2014-03-06 DIAGNOSIS — I10 Essential (primary) hypertension: Secondary | ICD-10-CM | POA: Diagnosis not present

## 2014-03-07 DIAGNOSIS — F3289 Other specified depressive episodes: Secondary | ICD-10-CM | POA: Diagnosis not present

## 2014-03-07 DIAGNOSIS — I10 Essential (primary) hypertension: Secondary | ICD-10-CM | POA: Diagnosis not present

## 2014-03-07 DIAGNOSIS — I509 Heart failure, unspecified: Secondary | ICD-10-CM | POA: Diagnosis not present

## 2014-03-07 DIAGNOSIS — I4891 Unspecified atrial fibrillation: Secondary | ICD-10-CM | POA: Diagnosis not present

## 2014-03-07 DIAGNOSIS — E119 Type 2 diabetes mellitus without complications: Secondary | ICD-10-CM | POA: Diagnosis not present

## 2014-03-07 DIAGNOSIS — F329 Major depressive disorder, single episode, unspecified: Secondary | ICD-10-CM | POA: Diagnosis not present

## 2014-03-07 DIAGNOSIS — M4800 Spinal stenosis, site unspecified: Secondary | ICD-10-CM | POA: Diagnosis not present

## 2014-03-10 DIAGNOSIS — E119 Type 2 diabetes mellitus without complications: Secondary | ICD-10-CM | POA: Diagnosis not present

## 2014-03-10 DIAGNOSIS — I509 Heart failure, unspecified: Secondary | ICD-10-CM | POA: Diagnosis not present

## 2014-03-10 DIAGNOSIS — I4891 Unspecified atrial fibrillation: Secondary | ICD-10-CM | POA: Diagnosis not present

## 2014-03-10 DIAGNOSIS — I10 Essential (primary) hypertension: Secondary | ICD-10-CM | POA: Diagnosis not present

## 2014-03-10 DIAGNOSIS — M4800 Spinal stenosis, site unspecified: Secondary | ICD-10-CM | POA: Diagnosis not present

## 2014-03-10 DIAGNOSIS — F329 Major depressive disorder, single episode, unspecified: Secondary | ICD-10-CM | POA: Diagnosis not present

## 2014-03-10 DIAGNOSIS — F3289 Other specified depressive episodes: Secondary | ICD-10-CM | POA: Diagnosis not present

## 2014-03-11 DIAGNOSIS — I4891 Unspecified atrial fibrillation: Secondary | ICD-10-CM | POA: Diagnosis not present

## 2014-03-11 DIAGNOSIS — R609 Edema, unspecified: Secondary | ICD-10-CM | POA: Diagnosis not present

## 2014-03-11 DIAGNOSIS — I10 Essential (primary) hypertension: Secondary | ICD-10-CM | POA: Diagnosis not present

## 2014-03-12 DIAGNOSIS — F329 Major depressive disorder, single episode, unspecified: Secondary | ICD-10-CM | POA: Diagnosis not present

## 2014-03-12 DIAGNOSIS — I4891 Unspecified atrial fibrillation: Secondary | ICD-10-CM | POA: Diagnosis not present

## 2014-03-12 DIAGNOSIS — M4800 Spinal stenosis, site unspecified: Secondary | ICD-10-CM | POA: Diagnosis not present

## 2014-03-12 DIAGNOSIS — I10 Essential (primary) hypertension: Secondary | ICD-10-CM | POA: Diagnosis not present

## 2014-03-12 DIAGNOSIS — I509 Heart failure, unspecified: Secondary | ICD-10-CM | POA: Diagnosis not present

## 2014-03-12 DIAGNOSIS — E119 Type 2 diabetes mellitus without complications: Secondary | ICD-10-CM | POA: Diagnosis not present

## 2014-03-12 DIAGNOSIS — F3289 Other specified depressive episodes: Secondary | ICD-10-CM | POA: Diagnosis not present

## 2014-03-13 DIAGNOSIS — I10 Essential (primary) hypertension: Secondary | ICD-10-CM | POA: Diagnosis not present

## 2014-03-13 DIAGNOSIS — F3289 Other specified depressive episodes: Secondary | ICD-10-CM | POA: Diagnosis not present

## 2014-03-13 DIAGNOSIS — F329 Major depressive disorder, single episode, unspecified: Secondary | ICD-10-CM | POA: Diagnosis not present

## 2014-03-13 DIAGNOSIS — I4891 Unspecified atrial fibrillation: Secondary | ICD-10-CM | POA: Diagnosis not present

## 2014-03-13 DIAGNOSIS — M4800 Spinal stenosis, site unspecified: Secondary | ICD-10-CM | POA: Diagnosis not present

## 2014-03-13 DIAGNOSIS — E119 Type 2 diabetes mellitus without complications: Secondary | ICD-10-CM | POA: Diagnosis not present

## 2014-03-13 DIAGNOSIS — I509 Heart failure, unspecified: Secondary | ICD-10-CM | POA: Diagnosis not present

## 2014-03-14 DIAGNOSIS — F329 Major depressive disorder, single episode, unspecified: Secondary | ICD-10-CM | POA: Diagnosis not present

## 2014-03-14 DIAGNOSIS — I509 Heart failure, unspecified: Secondary | ICD-10-CM | POA: Diagnosis not present

## 2014-03-14 DIAGNOSIS — I10 Essential (primary) hypertension: Secondary | ICD-10-CM | POA: Diagnosis not present

## 2014-03-14 DIAGNOSIS — F3289 Other specified depressive episodes: Secondary | ICD-10-CM | POA: Diagnosis not present

## 2014-03-14 DIAGNOSIS — M4800 Spinal stenosis, site unspecified: Secondary | ICD-10-CM | POA: Diagnosis not present

## 2014-03-14 DIAGNOSIS — I4891 Unspecified atrial fibrillation: Secondary | ICD-10-CM | POA: Diagnosis not present

## 2014-03-14 DIAGNOSIS — E119 Type 2 diabetes mellitus without complications: Secondary | ICD-10-CM | POA: Diagnosis not present

## 2014-03-17 DIAGNOSIS — R1032 Left lower quadrant pain: Secondary | ICD-10-CM | POA: Diagnosis not present

## 2014-03-17 DIAGNOSIS — K625 Hemorrhage of anus and rectum: Secondary | ICD-10-CM | POA: Diagnosis not present

## 2014-03-18 ENCOUNTER — Inpatient Hospital Stay: Payer: Self-pay | Admitting: Internal Medicine

## 2014-03-18 DIAGNOSIS — G609 Hereditary and idiopathic neuropathy, unspecified: Secondary | ICD-10-CM | POA: Diagnosis present

## 2014-03-18 DIAGNOSIS — K439 Ventral hernia without obstruction or gangrene: Secondary | ICD-10-CM | POA: Diagnosis not present

## 2014-03-18 DIAGNOSIS — J90 Pleural effusion, not elsewhere classified: Secondary | ICD-10-CM | POA: Diagnosis not present

## 2014-03-18 DIAGNOSIS — D649 Anemia, unspecified: Secondary | ICD-10-CM | POA: Diagnosis not present

## 2014-03-18 DIAGNOSIS — R197 Diarrhea, unspecified: Secondary | ICD-10-CM | POA: Diagnosis not present

## 2014-03-18 DIAGNOSIS — R000 Tachycardia, unspecified: Secondary | ICD-10-CM | POA: Diagnosis not present

## 2014-03-18 DIAGNOSIS — I5033 Acute on chronic diastolic (congestive) heart failure: Secondary | ICD-10-CM | POA: Diagnosis not present

## 2014-03-18 DIAGNOSIS — K5730 Diverticulosis of large intestine without perforation or abscess without bleeding: Secondary | ICD-10-CM | POA: Diagnosis not present

## 2014-03-18 DIAGNOSIS — I6529 Occlusion and stenosis of unspecified carotid artery: Secondary | ICD-10-CM | POA: Diagnosis present

## 2014-03-18 DIAGNOSIS — E871 Hypo-osmolality and hyponatremia: Secondary | ICD-10-CM | POA: Diagnosis not present

## 2014-03-18 DIAGNOSIS — K922 Gastrointestinal hemorrhage, unspecified: Secondary | ICD-10-CM | POA: Diagnosis not present

## 2014-03-18 DIAGNOSIS — K920 Hematemesis: Secondary | ICD-10-CM | POA: Diagnosis not present

## 2014-03-18 DIAGNOSIS — K644 Residual hemorrhoidal skin tags: Secondary | ICD-10-CM | POA: Diagnosis not present

## 2014-03-18 DIAGNOSIS — Z5189 Encounter for other specified aftercare: Secondary | ICD-10-CM | POA: Diagnosis not present

## 2014-03-18 DIAGNOSIS — R112 Nausea with vomiting, unspecified: Secondary | ICD-10-CM | POA: Diagnosis not present

## 2014-03-18 DIAGNOSIS — J449 Chronic obstructive pulmonary disease, unspecified: Secondary | ICD-10-CM | POA: Diagnosis not present

## 2014-03-18 DIAGNOSIS — F411 Generalized anxiety disorder: Secondary | ICD-10-CM | POA: Diagnosis present

## 2014-03-18 DIAGNOSIS — M4800 Spinal stenosis, site unspecified: Secondary | ICD-10-CM | POA: Diagnosis not present

## 2014-03-18 DIAGNOSIS — F3289 Other specified depressive episodes: Secondary | ICD-10-CM | POA: Diagnosis present

## 2014-03-18 DIAGNOSIS — N390 Urinary tract infection, site not specified: Secondary | ICD-10-CM | POA: Diagnosis not present

## 2014-03-18 DIAGNOSIS — E119 Type 2 diabetes mellitus without complications: Secondary | ICD-10-CM | POA: Diagnosis present

## 2014-03-18 DIAGNOSIS — E039 Hypothyroidism, unspecified: Secondary | ICD-10-CM | POA: Diagnosis present

## 2014-03-18 DIAGNOSIS — R5381 Other malaise: Secondary | ICD-10-CM | POA: Diagnosis not present

## 2014-03-18 DIAGNOSIS — E876 Hypokalemia: Secondary | ICD-10-CM | POA: Diagnosis not present

## 2014-03-18 DIAGNOSIS — M810 Age-related osteoporosis without current pathological fracture: Secondary | ICD-10-CM | POA: Diagnosis not present

## 2014-03-18 DIAGNOSIS — K626 Ulcer of anus and rectum: Secondary | ICD-10-CM | POA: Diagnosis not present

## 2014-03-18 DIAGNOSIS — G9340 Encephalopathy, unspecified: Secondary | ICD-10-CM | POA: Diagnosis not present

## 2014-03-18 DIAGNOSIS — J9600 Acute respiratory failure, unspecified whether with hypoxia or hypercapnia: Secondary | ICD-10-CM | POA: Diagnosis not present

## 2014-03-18 DIAGNOSIS — K625 Hemorrhage of anus and rectum: Secondary | ICD-10-CM | POA: Diagnosis not present

## 2014-03-18 DIAGNOSIS — D126 Benign neoplasm of colon, unspecified: Secondary | ICD-10-CM | POA: Diagnosis not present

## 2014-03-18 DIAGNOSIS — I509 Heart failure, unspecified: Secondary | ICD-10-CM | POA: Diagnosis present

## 2014-03-18 DIAGNOSIS — F341 Dysthymic disorder: Secondary | ICD-10-CM | POA: Diagnosis not present

## 2014-03-18 DIAGNOSIS — I5030 Unspecified diastolic (congestive) heart failure: Secondary | ICD-10-CM | POA: Diagnosis not present

## 2014-03-18 DIAGNOSIS — K2100 Gastro-esophageal reflux disease with esophagitis, without bleeding: Secondary | ICD-10-CM | POA: Diagnosis not present

## 2014-03-18 DIAGNOSIS — F329 Major depressive disorder, single episode, unspecified: Secondary | ICD-10-CM | POA: Diagnosis not present

## 2014-03-18 DIAGNOSIS — K5520 Angiodysplasia of colon without hemorrhage: Secondary | ICD-10-CM | POA: Diagnosis not present

## 2014-03-18 DIAGNOSIS — N179 Acute kidney failure, unspecified: Secondary | ICD-10-CM | POA: Diagnosis not present

## 2014-03-18 DIAGNOSIS — R1032 Left lower quadrant pain: Secondary | ICD-10-CM | POA: Diagnosis not present

## 2014-03-18 DIAGNOSIS — I10 Essential (primary) hypertension: Secondary | ICD-10-CM | POA: Diagnosis present

## 2014-03-18 DIAGNOSIS — K219 Gastro-esophageal reflux disease without esophagitis: Secondary | ICD-10-CM | POA: Diagnosis not present

## 2014-03-18 DIAGNOSIS — R0989 Other specified symptoms and signs involving the circulatory and respiratory systems: Secondary | ICD-10-CM | POA: Diagnosis not present

## 2014-03-18 DIAGNOSIS — E785 Hyperlipidemia, unspecified: Secondary | ICD-10-CM | POA: Diagnosis not present

## 2014-03-18 DIAGNOSIS — I701 Atherosclerosis of renal artery: Secondary | ICD-10-CM | POA: Diagnosis present

## 2014-03-18 DIAGNOSIS — R5383 Other fatigue: Secondary | ICD-10-CM | POA: Diagnosis not present

## 2014-03-18 DIAGNOSIS — Z96659 Presence of unspecified artificial knee joint: Secondary | ICD-10-CM | POA: Diagnosis not present

## 2014-03-18 DIAGNOSIS — I4891 Unspecified atrial fibrillation: Secondary | ICD-10-CM | POA: Diagnosis present

## 2014-03-18 DIAGNOSIS — K5289 Other specified noninfective gastroenteritis and colitis: Secondary | ICD-10-CM | POA: Diagnosis not present

## 2014-03-18 DIAGNOSIS — R0609 Other forms of dyspnea: Secondary | ICD-10-CM | POA: Diagnosis not present

## 2014-03-18 DIAGNOSIS — I739 Peripheral vascular disease, unspecified: Secondary | ICD-10-CM | POA: Diagnosis not present

## 2014-03-18 LAB — COMPREHENSIVE METABOLIC PANEL
ALBUMIN: 3.7 g/dL (ref 3.4–5.0)
ALK PHOS: 84 U/L
ALT: 21 U/L (ref 12–78)
ANION GAP: 10 (ref 7–16)
BUN: 12 mg/dL (ref 7–18)
Bilirubin,Total: 0.6 mg/dL (ref 0.2–1.0)
CALCIUM: 8.3 mg/dL — AB (ref 8.5–10.1)
CHLORIDE: 94 mmol/L — AB (ref 98–107)
Co2: 25 mmol/L (ref 21–32)
Creatinine: 1.68 mg/dL — ABNORMAL HIGH (ref 0.60–1.30)
EGFR (African American): 32 — ABNORMAL LOW
EGFR (Non-African Amer.): 28 — ABNORMAL LOW
Glucose: 119 mg/dL — ABNORMAL HIGH (ref 65–99)
OSMOLALITY: 260 (ref 275–301)
POTASSIUM: 3.2 mmol/L — AB (ref 3.5–5.1)
SGOT(AST): 23 U/L (ref 15–37)
Sodium: 129 mmol/L — ABNORMAL LOW (ref 136–145)
TOTAL PROTEIN: 6.4 g/dL (ref 6.4–8.2)

## 2014-03-18 LAB — CBC
HCT: 34.1 % — ABNORMAL LOW (ref 35.0–47.0)
HGB: 11.2 g/dL — ABNORMAL LOW (ref 12.0–16.0)
MCH: 30.8 pg (ref 26.0–34.0)
MCHC: 32.7 g/dL (ref 32.0–36.0)
MCV: 94 fL (ref 80–100)
Platelet: 242 10*3/uL (ref 150–440)
RBC: 3.63 10*6/uL — ABNORMAL LOW (ref 3.80–5.20)
RDW: 14.9 % — ABNORMAL HIGH (ref 11.5–14.5)
WBC: 10.2 10*3/uL (ref 3.6–11.0)

## 2014-03-18 LAB — PROTIME-INR
INR: 1.1
Prothrombin Time: 14.1 secs (ref 11.5–14.7)

## 2014-03-19 LAB — BASIC METABOLIC PANEL
ANION GAP: 10 (ref 7–16)
BUN: 12 mg/dL (ref 7–18)
CALCIUM: 7.5 mg/dL — AB (ref 8.5–10.1)
CO2: 25 mmol/L (ref 21–32)
Chloride: 97 mmol/L — ABNORMAL LOW (ref 98–107)
Creatinine: 1.95 mg/dL — ABNORMAL HIGH (ref 0.60–1.30)
EGFR (African American): 27 — ABNORMAL LOW
EGFR (Non-African Amer.): 23 — ABNORMAL LOW
Glucose: 144 mg/dL — ABNORMAL HIGH (ref 65–99)
Osmolality: 267 (ref 275–301)
Potassium: 3.6 mmol/L (ref 3.5–5.1)
Sodium: 132 mmol/L — ABNORMAL LOW (ref 136–145)

## 2014-03-19 LAB — CBC WITH DIFFERENTIAL/PLATELET
BASOS ABS: 0 10*3/uL (ref 0.0–0.1)
BASOS PCT: 0.6 %
Eosinophil #: 0.2 10*3/uL (ref 0.0–0.7)
Eosinophil %: 2.2 %
HCT: 31.9 % — AB (ref 35.0–47.0)
HGB: 10.7 g/dL — AB (ref 12.0–16.0)
LYMPHS ABS: 1.1 10*3/uL (ref 1.0–3.6)
Lymphocyte %: 14.1 %
MCH: 31.6 pg (ref 26.0–34.0)
MCHC: 33.5 g/dL (ref 32.0–36.0)
MCV: 94 fL (ref 80–100)
MONO ABS: 0.8 x10 3/mm (ref 0.2–0.9)
Monocyte %: 10.3 %
NEUTROS ABS: 5.8 10*3/uL (ref 1.4–6.5)
Neutrophil %: 72.8 %
Platelet: 206 10*3/uL (ref 150–440)
RBC: 3.39 10*6/uL — ABNORMAL LOW (ref 3.80–5.20)
RDW: 14.9 % — ABNORMAL HIGH (ref 11.5–14.5)
WBC: 8 10*3/uL (ref 3.6–11.0)

## 2014-03-19 LAB — HEMOGLOBIN: HGB: 11 g/dL — AB (ref 12.0–16.0)

## 2014-03-19 LAB — MAGNESIUM: Magnesium: 1.5 mg/dL — ABNORMAL LOW

## 2014-03-20 LAB — CBC WITH DIFFERENTIAL/PLATELET
BASOS ABS: 0 10*3/uL (ref 0.0–0.1)
Basophil %: 0.4 %
Eosinophil #: 0.3 10*3/uL (ref 0.0–0.7)
Eosinophil %: 3.4 %
HCT: 33.1 % — ABNORMAL LOW (ref 35.0–47.0)
HGB: 11.2 g/dL — ABNORMAL LOW (ref 12.0–16.0)
Lymphocyte #: 1 10*3/uL (ref 1.0–3.6)
Lymphocyte %: 14.1 %
MCH: 31.5 pg (ref 26.0–34.0)
MCHC: 33.8 g/dL (ref 32.0–36.0)
MCV: 93 fL (ref 80–100)
MONO ABS: 0.8 x10 3/mm (ref 0.2–0.9)
Monocyte %: 10.6 %
NEUTROS PCT: 71.5 %
Neutrophil #: 5.3 10*3/uL (ref 1.4–6.5)
Platelet: 220 10*3/uL (ref 150–440)
RBC: 3.56 10*6/uL — AB (ref 3.80–5.20)
RDW: 14.4 % (ref 11.5–14.5)
WBC: 7.4 10*3/uL (ref 3.6–11.0)

## 2014-03-20 LAB — URINALYSIS, COMPLETE
Bilirubin,UR: NEGATIVE
Glucose,UR: 50 mg/dL (ref 0–75)
Ketone: NEGATIVE
Nitrite: NEGATIVE
PROTEIN: NEGATIVE
Ph: 6 (ref 4.5–8.0)
RBC,UR: 3 /HPF (ref 0–5)
SPECIFIC GRAVITY: 1.005 (ref 1.003–1.030)
Squamous Epithelial: 3

## 2014-03-21 LAB — BASIC METABOLIC PANEL
Anion Gap: 10 (ref 7–16)
BUN: 12 mg/dL (ref 7–18)
CALCIUM: 8.9 mg/dL (ref 8.5–10.1)
CHLORIDE: 100 mmol/L (ref 98–107)
Co2: 23 mmol/L (ref 21–32)
Creatinine: 2.21 mg/dL — ABNORMAL HIGH (ref 0.60–1.30)
EGFR (Non-African Amer.): 20 — ABNORMAL LOW
GFR CALC AF AMER: 23 — AB
GLUCOSE: 166 mg/dL — AB (ref 65–99)
Osmolality: 270 (ref 275–301)
Potassium: 3.5 mmol/L (ref 3.5–5.1)
Sodium: 133 mmol/L — ABNORMAL LOW (ref 136–145)

## 2014-03-21 LAB — MAGNESIUM: MAGNESIUM: 2.1 mg/dL

## 2014-03-21 LAB — HEMOGLOBIN: HGB: 12 g/dL (ref 12.0–16.0)

## 2014-03-22 LAB — BASIC METABOLIC PANEL
Anion Gap: 10 (ref 7–16)
BUN: 10 mg/dL (ref 7–18)
CALCIUM: 8.6 mg/dL (ref 8.5–10.1)
CHLORIDE: 104 mmol/L (ref 98–107)
Co2: 21 mmol/L (ref 21–32)
Creatinine: 1.99 mg/dL — ABNORMAL HIGH (ref 0.60–1.30)
EGFR (African American): 26 — ABNORMAL LOW
EGFR (Non-African Amer.): 23 — ABNORMAL LOW
Glucose: 171 mg/dL — ABNORMAL HIGH (ref 65–99)
Osmolality: 273 (ref 275–301)
Potassium: 3.6 mmol/L (ref 3.5–5.1)
Sodium: 135 mmol/L — ABNORMAL LOW (ref 136–145)

## 2014-03-22 LAB — URINE CULTURE

## 2014-03-23 DIAGNOSIS — R000 Tachycardia, unspecified: Secondary | ICD-10-CM

## 2014-03-23 LAB — BASIC METABOLIC PANEL
ANION GAP: 14 (ref 7–16)
BUN: 15 mg/dL (ref 7–18)
CO2: 17 mmol/L — AB (ref 21–32)
Calcium, Total: 8.4 mg/dL — ABNORMAL LOW (ref 8.5–10.1)
Chloride: 108 mmol/L — ABNORMAL HIGH (ref 98–107)
Creatinine: 2.18 mg/dL — ABNORMAL HIGH (ref 0.60–1.30)
EGFR (African American): 24 — ABNORMAL LOW
EGFR (Non-African Amer.): 20 — ABNORMAL LOW
GLUCOSE: 141 mg/dL — AB (ref 65–99)
Osmolality: 281 (ref 275–301)
Potassium: 4.2 mmol/L (ref 3.5–5.1)
Sodium: 139 mmol/L (ref 136–145)

## 2014-03-23 LAB — PRO B NATRIURETIC PEPTIDE: B-TYPE NATIURETIC PEPTID: 23205 pg/mL — AB (ref 0–450)

## 2014-03-24 LAB — CBC WITH DIFFERENTIAL/PLATELET
BASOS ABS: 0.1 10*3/uL (ref 0.0–0.1)
Basophil %: 0.4 %
EOS PCT: 0.7 %
Eosinophil #: 0.1 10*3/uL (ref 0.0–0.7)
HCT: 32.4 % — ABNORMAL LOW (ref 35.0–47.0)
HGB: 10.4 g/dL — ABNORMAL LOW (ref 12.0–16.0)
LYMPHS PCT: 11.6 %
Lymphocyte #: 1.5 10*3/uL (ref 1.0–3.6)
MCH: 30 pg (ref 26.0–34.0)
MCHC: 32.2 g/dL (ref 32.0–36.0)
MCV: 93 fL (ref 80–100)
Monocyte #: 1.1 x10 3/mm — ABNORMAL HIGH (ref 0.2–0.9)
Monocyte %: 8.4 %
NEUTROS PCT: 78.9 %
Neutrophil #: 10.2 10*3/uL — ABNORMAL HIGH (ref 1.4–6.5)
PLATELETS: 199 10*3/uL (ref 150–440)
RBC: 3.47 10*6/uL — AB (ref 3.80–5.20)
RDW: 14.7 % — ABNORMAL HIGH (ref 11.5–14.5)
WBC: 12.9 10*3/uL — ABNORMAL HIGH (ref 3.6–11.0)

## 2014-03-24 LAB — BASIC METABOLIC PANEL
Anion Gap: 10 (ref 7–16)
BUN: 23 mg/dL — ABNORMAL HIGH (ref 7–18)
Calcium, Total: 8.9 mg/dL (ref 8.5–10.1)
Chloride: 106 mmol/L (ref 98–107)
Co2: 22 mmol/L (ref 21–32)
Creatinine: 2.26 mg/dL — ABNORMAL HIGH (ref 0.60–1.30)
EGFR (Non-African Amer.): 19 — ABNORMAL LOW
GFR CALC AF AMER: 23 — AB
Glucose: 146 mg/dL — ABNORMAL HIGH (ref 65–99)
Osmolality: 282 (ref 275–301)
Potassium: 3.5 mmol/L (ref 3.5–5.1)
Sodium: 138 mmol/L (ref 136–145)

## 2014-03-24 LAB — PATHOLOGY REPORT

## 2014-03-26 LAB — CBC WITH DIFFERENTIAL/PLATELET
Basophil #: 0 10*3/uL (ref 0.0–0.1)
Basophil %: 0.4 %
EOS ABS: 0.3 10*3/uL (ref 0.0–0.7)
EOS PCT: 3 %
HCT: 30.8 % — ABNORMAL LOW (ref 35.0–47.0)
HGB: 10.4 g/dL — AB (ref 12.0–16.0)
LYMPHS ABS: 1.2 10*3/uL (ref 1.0–3.6)
Lymphocyte %: 13.7 %
MCH: 31.5 pg (ref 26.0–34.0)
MCHC: 33.8 g/dL (ref 32.0–36.0)
MCV: 93 fL (ref 80–100)
Monocyte #: 0.8 x10 3/mm (ref 0.2–0.9)
Monocyte %: 9.7 %
Neutrophil #: 6.2 10*3/uL (ref 1.4–6.5)
Neutrophil %: 73.2 %
Platelet: 177 10*3/uL (ref 150–440)
RBC: 3.31 10*6/uL — AB (ref 3.80–5.20)
RDW: 14.6 % — ABNORMAL HIGH (ref 11.5–14.5)
WBC: 8.5 10*3/uL (ref 3.6–11.0)

## 2014-03-26 LAB — BASIC METABOLIC PANEL
Anion Gap: 10 (ref 7–16)
BUN: 22 mg/dL — AB (ref 7–18)
Calcium, Total: 8.5 mg/dL (ref 8.5–10.1)
Chloride: 100 mmol/L (ref 98–107)
Co2: 28 mmol/L (ref 21–32)
Creatinine: 1.72 mg/dL — ABNORMAL HIGH (ref 0.60–1.30)
EGFR (African American): 31 — ABNORMAL LOW
EGFR (Non-African Amer.): 27 — ABNORMAL LOW
Glucose: 192 mg/dL — ABNORMAL HIGH (ref 65–99)
Osmolality: 284 (ref 275–301)
POTASSIUM: 3 mmol/L — AB (ref 3.5–5.1)
Sodium: 138 mmol/L (ref 136–145)

## 2014-03-26 LAB — MAGNESIUM: Magnesium: 1.4 mg/dL — ABNORMAL LOW

## 2014-03-27 DIAGNOSIS — I509 Heart failure, unspecified: Secondary | ICD-10-CM | POA: Diagnosis not present

## 2014-03-27 DIAGNOSIS — Z5189 Encounter for other specified aftercare: Secondary | ICD-10-CM | POA: Diagnosis not present

## 2014-03-27 DIAGNOSIS — M4800 Spinal stenosis, site unspecified: Secondary | ICD-10-CM | POA: Diagnosis not present

## 2014-03-27 DIAGNOSIS — J9600 Acute respiratory failure, unspecified whether with hypoxia or hypercapnia: Secondary | ICD-10-CM | POA: Diagnosis not present

## 2014-03-27 DIAGNOSIS — G609 Hereditary and idiopathic neuropathy, unspecified: Secondary | ICD-10-CM | POA: Diagnosis not present

## 2014-03-27 DIAGNOSIS — K5730 Diverticulosis of large intestine without perforation or abscess without bleeding: Secondary | ICD-10-CM | POA: Diagnosis not present

## 2014-03-27 DIAGNOSIS — N179 Acute kidney failure, unspecified: Secondary | ICD-10-CM | POA: Diagnosis not present

## 2014-03-27 DIAGNOSIS — E785 Hyperlipidemia, unspecified: Secondary | ICD-10-CM | POA: Diagnosis not present

## 2014-03-27 DIAGNOSIS — I4891 Unspecified atrial fibrillation: Secondary | ICD-10-CM | POA: Diagnosis not present

## 2014-03-27 DIAGNOSIS — Z96659 Presence of unspecified artificial knee joint: Secondary | ICD-10-CM | POA: Diagnosis not present

## 2014-03-27 DIAGNOSIS — R5381 Other malaise: Secondary | ICD-10-CM | POA: Diagnosis not present

## 2014-03-27 DIAGNOSIS — R5383 Other fatigue: Secondary | ICD-10-CM | POA: Diagnosis not present

## 2014-03-27 DIAGNOSIS — F341 Dysthymic disorder: Secondary | ICD-10-CM | POA: Diagnosis not present

## 2014-03-27 DIAGNOSIS — I5030 Unspecified diastolic (congestive) heart failure: Secondary | ICD-10-CM | POA: Diagnosis not present

## 2014-03-27 DIAGNOSIS — G9340 Encephalopathy, unspecified: Secondary | ICD-10-CM | POA: Diagnosis not present

## 2014-03-27 DIAGNOSIS — M810 Age-related osteoporosis without current pathological fracture: Secondary | ICD-10-CM | POA: Diagnosis not present

## 2014-03-27 DIAGNOSIS — K644 Residual hemorrhoidal skin tags: Secondary | ICD-10-CM | POA: Diagnosis not present

## 2014-03-27 DIAGNOSIS — D126 Benign neoplasm of colon, unspecified: Secondary | ICD-10-CM | POA: Diagnosis not present

## 2014-03-27 DIAGNOSIS — E119 Type 2 diabetes mellitus without complications: Secondary | ICD-10-CM | POA: Diagnosis not present

## 2014-03-27 DIAGNOSIS — I10 Essential (primary) hypertension: Secondary | ICD-10-CM | POA: Diagnosis not present

## 2014-03-27 DIAGNOSIS — I739 Peripheral vascular disease, unspecified: Secondary | ICD-10-CM | POA: Diagnosis not present

## 2014-03-27 DIAGNOSIS — K219 Gastro-esophageal reflux disease without esophagitis: Secondary | ICD-10-CM | POA: Diagnosis not present

## 2014-03-27 DIAGNOSIS — N390 Urinary tract infection, site not specified: Secondary | ICD-10-CM | POA: Diagnosis not present

## 2014-03-27 DIAGNOSIS — J449 Chronic obstructive pulmonary disease, unspecified: Secondary | ICD-10-CM | POA: Diagnosis not present

## 2014-03-27 DIAGNOSIS — E039 Hypothyroidism, unspecified: Secondary | ICD-10-CM | POA: Diagnosis not present

## 2014-03-27 LAB — BASIC METABOLIC PANEL
Anion Gap: 7 (ref 7–16)
BUN: 24 mg/dL — AB (ref 7–18)
CO2: 31 mmol/L (ref 21–32)
Calcium, Total: 9 mg/dL (ref 8.5–10.1)
Chloride: 100 mmol/L (ref 98–107)
Creatinine: 1.67 mg/dL — ABNORMAL HIGH (ref 0.60–1.30)
EGFR (African American): 32 — ABNORMAL LOW
EGFR (Non-African Amer.): 28 — ABNORMAL LOW
GLUCOSE: 215 mg/dL — AB (ref 65–99)
Osmolality: 286 (ref 275–301)
Potassium: 3.4 mmol/L — ABNORMAL LOW (ref 3.5–5.1)
SODIUM: 138 mmol/L (ref 136–145)

## 2014-03-27 LAB — CBC WITH DIFFERENTIAL/PLATELET
BASOS ABS: 0.1 10*3/uL (ref 0.0–0.1)
Basophil %: 0.5 %
EOS PCT: 2.7 %
Eosinophil #: 0.3 10*3/uL (ref 0.0–0.7)
HCT: 33.9 % — ABNORMAL LOW (ref 35.0–47.0)
HGB: 11 g/dL — ABNORMAL LOW (ref 12.0–16.0)
LYMPHS PCT: 13.9 %
Lymphocyte #: 1.4 10*3/uL (ref 1.0–3.6)
MCH: 30.3 pg (ref 26.0–34.0)
MCHC: 32.4 g/dL (ref 32.0–36.0)
MCV: 93 fL (ref 80–100)
MONO ABS: 1 x10 3/mm — AB (ref 0.2–0.9)
MONOS PCT: 10.6 %
NEUTROS ABS: 7.1 10*3/uL — AB (ref 1.4–6.5)
Neutrophil %: 72.3 %
PLATELETS: 216 10*3/uL (ref 150–440)
RBC: 3.63 10*6/uL — ABNORMAL LOW (ref 3.80–5.20)
RDW: 14.9 % — AB (ref 11.5–14.5)
WBC: 9.8 10*3/uL (ref 3.6–11.0)

## 2014-04-02 DIAGNOSIS — D126 Benign neoplasm of colon, unspecified: Secondary | ICD-10-CM | POA: Diagnosis not present

## 2014-04-02 DIAGNOSIS — K644 Residual hemorrhoidal skin tags: Secondary | ICD-10-CM | POA: Diagnosis not present

## 2014-04-02 DIAGNOSIS — E785 Hyperlipidemia, unspecified: Secondary | ICD-10-CM | POA: Diagnosis not present

## 2014-04-02 DIAGNOSIS — F341 Dysthymic disorder: Secondary | ICD-10-CM | POA: Diagnosis not present

## 2014-04-02 DIAGNOSIS — Z96659 Presence of unspecified artificial knee joint: Secondary | ICD-10-CM | POA: Diagnosis not present

## 2014-04-02 DIAGNOSIS — I739 Peripheral vascular disease, unspecified: Secondary | ICD-10-CM | POA: Diagnosis not present

## 2014-04-02 DIAGNOSIS — M4800 Spinal stenosis, site unspecified: Secondary | ICD-10-CM | POA: Diagnosis not present

## 2014-04-02 DIAGNOSIS — M810 Age-related osteoporosis without current pathological fracture: Secondary | ICD-10-CM | POA: Diagnosis not present

## 2014-04-02 DIAGNOSIS — K5730 Diverticulosis of large intestine without perforation or abscess without bleeding: Secondary | ICD-10-CM | POA: Diagnosis not present

## 2014-04-02 DIAGNOSIS — E119 Type 2 diabetes mellitus without complications: Secondary | ICD-10-CM | POA: Diagnosis not present

## 2014-04-02 DIAGNOSIS — I509 Heart failure, unspecified: Secondary | ICD-10-CM | POA: Diagnosis not present

## 2014-04-02 DIAGNOSIS — G609 Hereditary and idiopathic neuropathy, unspecified: Secondary | ICD-10-CM | POA: Diagnosis not present

## 2014-04-02 DIAGNOSIS — I5030 Unspecified diastolic (congestive) heart failure: Secondary | ICD-10-CM | POA: Diagnosis not present

## 2014-04-02 DIAGNOSIS — J449 Chronic obstructive pulmonary disease, unspecified: Secondary | ICD-10-CM | POA: Diagnosis not present

## 2014-04-02 DIAGNOSIS — K219 Gastro-esophageal reflux disease without esophagitis: Secondary | ICD-10-CM | POA: Diagnosis not present

## 2014-04-02 DIAGNOSIS — E039 Hypothyroidism, unspecified: Secondary | ICD-10-CM | POA: Diagnosis not present

## 2014-04-02 DIAGNOSIS — I10 Essential (primary) hypertension: Secondary | ICD-10-CM | POA: Diagnosis not present

## 2014-04-02 DIAGNOSIS — I4891 Unspecified atrial fibrillation: Secondary | ICD-10-CM | POA: Diagnosis not present

## 2014-04-02 DIAGNOSIS — Z5189 Encounter for other specified aftercare: Secondary | ICD-10-CM | POA: Diagnosis not present

## 2014-04-16 DIAGNOSIS — I5032 Chronic diastolic (congestive) heart failure: Secondary | ICD-10-CM | POA: Diagnosis not present

## 2014-04-16 DIAGNOSIS — I4891 Unspecified atrial fibrillation: Secondary | ICD-10-CM | POA: Diagnosis not present

## 2014-04-16 DIAGNOSIS — I10 Essential (primary) hypertension: Secondary | ICD-10-CM | POA: Diagnosis not present

## 2014-04-16 DIAGNOSIS — I748 Embolism and thrombosis of other arteries: Secondary | ICD-10-CM | POA: Diagnosis not present

## 2014-04-23 DIAGNOSIS — J441 Chronic obstructive pulmonary disease with (acute) exacerbation: Secondary | ICD-10-CM | POA: Diagnosis not present

## 2014-04-23 DIAGNOSIS — E119 Type 2 diabetes mellitus without complications: Secondary | ICD-10-CM | POA: Diagnosis not present

## 2014-04-23 DIAGNOSIS — R262 Difficulty in walking, not elsewhere classified: Secondary | ICD-10-CM | POA: Diagnosis not present

## 2014-04-23 DIAGNOSIS — I4891 Unspecified atrial fibrillation: Secondary | ICD-10-CM | POA: Diagnosis not present

## 2014-04-23 DIAGNOSIS — G609 Hereditary and idiopathic neuropathy, unspecified: Secondary | ICD-10-CM | POA: Diagnosis not present

## 2014-04-23 DIAGNOSIS — Z9181 History of falling: Secondary | ICD-10-CM | POA: Diagnosis not present

## 2014-04-23 DIAGNOSIS — Z602 Problems related to living alone: Secondary | ICD-10-CM | POA: Diagnosis not present

## 2014-04-23 DIAGNOSIS — J45901 Unspecified asthma with (acute) exacerbation: Secondary | ICD-10-CM | POA: Diagnosis not present

## 2014-04-23 DIAGNOSIS — M48061 Spinal stenosis, lumbar region without neurogenic claudication: Secondary | ICD-10-CM | POA: Diagnosis not present

## 2014-04-23 DIAGNOSIS — E785 Hyperlipidemia, unspecified: Secondary | ICD-10-CM | POA: Diagnosis not present

## 2014-04-23 DIAGNOSIS — I10 Essential (primary) hypertension: Secondary | ICD-10-CM | POA: Diagnosis not present

## 2014-04-23 DIAGNOSIS — M6281 Muscle weakness (generalized): Secondary | ICD-10-CM | POA: Diagnosis not present

## 2014-04-29 DIAGNOSIS — J441 Chronic obstructive pulmonary disease with (acute) exacerbation: Secondary | ICD-10-CM | POA: Diagnosis not present

## 2014-04-29 DIAGNOSIS — E119 Type 2 diabetes mellitus without complications: Secondary | ICD-10-CM | POA: Diagnosis not present

## 2014-04-29 DIAGNOSIS — I4891 Unspecified atrial fibrillation: Secondary | ICD-10-CM | POA: Diagnosis not present

## 2014-04-29 DIAGNOSIS — M48061 Spinal stenosis, lumbar region without neurogenic claudication: Secondary | ICD-10-CM | POA: Diagnosis not present

## 2014-04-29 DIAGNOSIS — I10 Essential (primary) hypertension: Secondary | ICD-10-CM | POA: Diagnosis not present

## 2014-04-29 DIAGNOSIS — G609 Hereditary and idiopathic neuropathy, unspecified: Secondary | ICD-10-CM | POA: Diagnosis not present

## 2014-04-30 DIAGNOSIS — J441 Chronic obstructive pulmonary disease with (acute) exacerbation: Secondary | ICD-10-CM | POA: Diagnosis not present

## 2014-04-30 DIAGNOSIS — I4891 Unspecified atrial fibrillation: Secondary | ICD-10-CM | POA: Diagnosis not present

## 2014-04-30 DIAGNOSIS — G609 Hereditary and idiopathic neuropathy, unspecified: Secondary | ICD-10-CM | POA: Diagnosis not present

## 2014-04-30 DIAGNOSIS — E119 Type 2 diabetes mellitus without complications: Secondary | ICD-10-CM | POA: Diagnosis not present

## 2014-04-30 DIAGNOSIS — I10 Essential (primary) hypertension: Secondary | ICD-10-CM | POA: Diagnosis not present

## 2014-04-30 DIAGNOSIS — M48061 Spinal stenosis, lumbar region without neurogenic claudication: Secondary | ICD-10-CM | POA: Diagnosis not present

## 2014-05-01 DIAGNOSIS — N390 Urinary tract infection, site not specified: Secondary | ICD-10-CM | POA: Diagnosis not present

## 2014-05-01 DIAGNOSIS — J441 Chronic obstructive pulmonary disease with (acute) exacerbation: Secondary | ICD-10-CM | POA: Diagnosis not present

## 2014-05-01 DIAGNOSIS — I4891 Unspecified atrial fibrillation: Secondary | ICD-10-CM | POA: Diagnosis not present

## 2014-05-01 DIAGNOSIS — G609 Hereditary and idiopathic neuropathy, unspecified: Secondary | ICD-10-CM | POA: Diagnosis not present

## 2014-05-01 DIAGNOSIS — M48061 Spinal stenosis, lumbar region without neurogenic claudication: Secondary | ICD-10-CM | POA: Diagnosis not present

## 2014-05-01 DIAGNOSIS — E119 Type 2 diabetes mellitus without complications: Secondary | ICD-10-CM | POA: Diagnosis not present

## 2014-05-01 DIAGNOSIS — J45901 Unspecified asthma with (acute) exacerbation: Secondary | ICD-10-CM | POA: Diagnosis not present

## 2014-05-01 DIAGNOSIS — I10 Essential (primary) hypertension: Secondary | ICD-10-CM | POA: Diagnosis not present

## 2014-05-06 DIAGNOSIS — I10 Essential (primary) hypertension: Secondary | ICD-10-CM | POA: Diagnosis not present

## 2014-05-06 DIAGNOSIS — G609 Hereditary and idiopathic neuropathy, unspecified: Secondary | ICD-10-CM | POA: Diagnosis not present

## 2014-05-06 DIAGNOSIS — E119 Type 2 diabetes mellitus without complications: Secondary | ICD-10-CM | POA: Diagnosis not present

## 2014-05-06 DIAGNOSIS — J441 Chronic obstructive pulmonary disease with (acute) exacerbation: Secondary | ICD-10-CM | POA: Diagnosis not present

## 2014-05-06 DIAGNOSIS — I4891 Unspecified atrial fibrillation: Secondary | ICD-10-CM | POA: Diagnosis not present

## 2014-05-06 DIAGNOSIS — M48061 Spinal stenosis, lumbar region without neurogenic claudication: Secondary | ICD-10-CM | POA: Diagnosis not present

## 2014-05-07 DIAGNOSIS — Z79899 Other long term (current) drug therapy: Secondary | ICD-10-CM | POA: Diagnosis not present

## 2014-05-07 DIAGNOSIS — E119 Type 2 diabetes mellitus without complications: Secondary | ICD-10-CM | POA: Diagnosis not present

## 2014-05-07 DIAGNOSIS — R609 Edema, unspecified: Secondary | ICD-10-CM | POA: Diagnosis not present

## 2014-05-07 DIAGNOSIS — K219 Gastro-esophageal reflux disease without esophagitis: Secondary | ICD-10-CM | POA: Diagnosis not present

## 2014-05-07 DIAGNOSIS — E039 Hypothyroidism, unspecified: Secondary | ICD-10-CM | POA: Diagnosis not present

## 2014-05-08 DIAGNOSIS — M48061 Spinal stenosis, lumbar region without neurogenic claudication: Secondary | ICD-10-CM | POA: Diagnosis not present

## 2014-05-08 DIAGNOSIS — G609 Hereditary and idiopathic neuropathy, unspecified: Secondary | ICD-10-CM | POA: Diagnosis not present

## 2014-05-08 DIAGNOSIS — I10 Essential (primary) hypertension: Secondary | ICD-10-CM | POA: Diagnosis not present

## 2014-05-08 DIAGNOSIS — I4891 Unspecified atrial fibrillation: Secondary | ICD-10-CM | POA: Diagnosis not present

## 2014-05-08 DIAGNOSIS — J441 Chronic obstructive pulmonary disease with (acute) exacerbation: Secondary | ICD-10-CM | POA: Diagnosis not present

## 2014-05-08 DIAGNOSIS — E119 Type 2 diabetes mellitus without complications: Secondary | ICD-10-CM | POA: Diagnosis not present

## 2014-05-13 DIAGNOSIS — E119 Type 2 diabetes mellitus without complications: Secondary | ICD-10-CM | POA: Diagnosis not present

## 2014-05-13 DIAGNOSIS — M48061 Spinal stenosis, lumbar region without neurogenic claudication: Secondary | ICD-10-CM | POA: Diagnosis not present

## 2014-05-13 DIAGNOSIS — I4891 Unspecified atrial fibrillation: Secondary | ICD-10-CM | POA: Diagnosis not present

## 2014-05-13 DIAGNOSIS — J441 Chronic obstructive pulmonary disease with (acute) exacerbation: Secondary | ICD-10-CM | POA: Diagnosis not present

## 2014-05-13 DIAGNOSIS — J45901 Unspecified asthma with (acute) exacerbation: Secondary | ICD-10-CM | POA: Diagnosis not present

## 2014-05-13 DIAGNOSIS — I10 Essential (primary) hypertension: Secondary | ICD-10-CM | POA: Diagnosis not present

## 2014-05-13 DIAGNOSIS — G609 Hereditary and idiopathic neuropathy, unspecified: Secondary | ICD-10-CM | POA: Diagnosis not present

## 2014-05-14 DIAGNOSIS — E119 Type 2 diabetes mellitus without complications: Secondary | ICD-10-CM | POA: Diagnosis not present

## 2014-05-14 DIAGNOSIS — J45901 Unspecified asthma with (acute) exacerbation: Secondary | ICD-10-CM | POA: Diagnosis not present

## 2014-05-14 DIAGNOSIS — I10 Essential (primary) hypertension: Secondary | ICD-10-CM | POA: Diagnosis not present

## 2014-05-14 DIAGNOSIS — J441 Chronic obstructive pulmonary disease with (acute) exacerbation: Secondary | ICD-10-CM | POA: Diagnosis not present

## 2014-05-14 DIAGNOSIS — M48061 Spinal stenosis, lumbar region without neurogenic claudication: Secondary | ICD-10-CM | POA: Diagnosis not present

## 2014-05-14 DIAGNOSIS — G609 Hereditary and idiopathic neuropathy, unspecified: Secondary | ICD-10-CM | POA: Diagnosis not present

## 2014-05-14 DIAGNOSIS — I4891 Unspecified atrial fibrillation: Secondary | ICD-10-CM | POA: Diagnosis not present

## 2014-05-15 DIAGNOSIS — I10 Essential (primary) hypertension: Secondary | ICD-10-CM | POA: Diagnosis not present

## 2014-05-15 DIAGNOSIS — G609 Hereditary and idiopathic neuropathy, unspecified: Secondary | ICD-10-CM | POA: Diagnosis not present

## 2014-05-15 DIAGNOSIS — J45901 Unspecified asthma with (acute) exacerbation: Secondary | ICD-10-CM | POA: Diagnosis not present

## 2014-05-15 DIAGNOSIS — E119 Type 2 diabetes mellitus without complications: Secondary | ICD-10-CM | POA: Diagnosis not present

## 2014-05-15 DIAGNOSIS — I4891 Unspecified atrial fibrillation: Secondary | ICD-10-CM | POA: Diagnosis not present

## 2014-05-15 DIAGNOSIS — M48061 Spinal stenosis, lumbar region without neurogenic claudication: Secondary | ICD-10-CM | POA: Diagnosis not present

## 2014-05-15 DIAGNOSIS — J441 Chronic obstructive pulmonary disease with (acute) exacerbation: Secondary | ICD-10-CM | POA: Diagnosis not present

## 2014-05-16 DIAGNOSIS — M48061 Spinal stenosis, lumbar region without neurogenic claudication: Secondary | ICD-10-CM | POA: Diagnosis not present

## 2014-05-16 DIAGNOSIS — E119 Type 2 diabetes mellitus without complications: Secondary | ICD-10-CM | POA: Diagnosis not present

## 2014-05-16 DIAGNOSIS — J441 Chronic obstructive pulmonary disease with (acute) exacerbation: Secondary | ICD-10-CM | POA: Diagnosis not present

## 2014-05-16 DIAGNOSIS — G609 Hereditary and idiopathic neuropathy, unspecified: Secondary | ICD-10-CM | POA: Diagnosis not present

## 2014-05-16 DIAGNOSIS — I4891 Unspecified atrial fibrillation: Secondary | ICD-10-CM | POA: Diagnosis not present

## 2014-05-16 DIAGNOSIS — I10 Essential (primary) hypertension: Secondary | ICD-10-CM | POA: Diagnosis not present

## 2014-05-20 DIAGNOSIS — J45901 Unspecified asthma with (acute) exacerbation: Secondary | ICD-10-CM | POA: Diagnosis not present

## 2014-05-20 DIAGNOSIS — M48061 Spinal stenosis, lumbar region without neurogenic claudication: Secondary | ICD-10-CM | POA: Diagnosis not present

## 2014-05-20 DIAGNOSIS — G609 Hereditary and idiopathic neuropathy, unspecified: Secondary | ICD-10-CM | POA: Diagnosis not present

## 2014-05-20 DIAGNOSIS — E119 Type 2 diabetes mellitus without complications: Secondary | ICD-10-CM | POA: Diagnosis not present

## 2014-05-20 DIAGNOSIS — I4891 Unspecified atrial fibrillation: Secondary | ICD-10-CM | POA: Diagnosis not present

## 2014-05-20 DIAGNOSIS — I10 Essential (primary) hypertension: Secondary | ICD-10-CM | POA: Diagnosis not present

## 2014-05-20 DIAGNOSIS — J441 Chronic obstructive pulmonary disease with (acute) exacerbation: Secondary | ICD-10-CM | POA: Diagnosis not present

## 2014-05-22 DIAGNOSIS — G609 Hereditary and idiopathic neuropathy, unspecified: Secondary | ICD-10-CM | POA: Diagnosis not present

## 2014-05-22 DIAGNOSIS — J441 Chronic obstructive pulmonary disease with (acute) exacerbation: Secondary | ICD-10-CM | POA: Diagnosis not present

## 2014-05-22 DIAGNOSIS — I10 Essential (primary) hypertension: Secondary | ICD-10-CM | POA: Diagnosis not present

## 2014-05-22 DIAGNOSIS — M48061 Spinal stenosis, lumbar region without neurogenic claudication: Secondary | ICD-10-CM | POA: Diagnosis not present

## 2014-05-22 DIAGNOSIS — I4891 Unspecified atrial fibrillation: Secondary | ICD-10-CM | POA: Diagnosis not present

## 2014-05-22 DIAGNOSIS — E119 Type 2 diabetes mellitus without complications: Secondary | ICD-10-CM | POA: Diagnosis not present

## 2014-05-24 ENCOUNTER — Inpatient Hospital Stay: Payer: Self-pay | Admitting: Internal Medicine

## 2014-05-24 DIAGNOSIS — E871 Hypo-osmolality and hyponatremia: Secondary | ICD-10-CM | POA: Diagnosis present

## 2014-05-24 DIAGNOSIS — M6281 Muscle weakness (generalized): Secondary | ICD-10-CM | POA: Diagnosis not present

## 2014-05-24 DIAGNOSIS — I4891 Unspecified atrial fibrillation: Secondary | ICD-10-CM | POA: Diagnosis not present

## 2014-05-24 DIAGNOSIS — F3289 Other specified depressive episodes: Secondary | ICD-10-CM | POA: Diagnosis present

## 2014-05-24 DIAGNOSIS — I739 Peripheral vascular disease, unspecified: Secondary | ICD-10-CM | POA: Diagnosis present

## 2014-05-24 DIAGNOSIS — Z7982 Long term (current) use of aspirin: Secondary | ICD-10-CM | POA: Diagnosis not present

## 2014-05-24 DIAGNOSIS — J189 Pneumonia, unspecified organism: Secondary | ICD-10-CM | POA: Diagnosis not present

## 2014-05-24 DIAGNOSIS — J449 Chronic obstructive pulmonary disease, unspecified: Secondary | ICD-10-CM | POA: Diagnosis present

## 2014-05-24 DIAGNOSIS — R5381 Other malaise: Secondary | ICD-10-CM | POA: Diagnosis not present

## 2014-05-24 DIAGNOSIS — R42 Dizziness and giddiness: Secondary | ICD-10-CM | POA: Diagnosis present

## 2014-05-24 DIAGNOSIS — R0989 Other specified symptoms and signs involving the circulatory and respiratory systems: Secondary | ICD-10-CM | POA: Diagnosis not present

## 2014-05-24 DIAGNOSIS — M1990 Unspecified osteoarthritis, unspecified site: Secondary | ICD-10-CM | POA: Diagnosis present

## 2014-05-24 DIAGNOSIS — M810 Age-related osteoporosis without current pathological fracture: Secondary | ICD-10-CM | POA: Diagnosis present

## 2014-05-24 DIAGNOSIS — F329 Major depressive disorder, single episode, unspecified: Secondary | ICD-10-CM | POA: Diagnosis present

## 2014-05-24 DIAGNOSIS — G609 Hereditary and idiopathic neuropathy, unspecified: Secondary | ICD-10-CM | POA: Diagnosis present

## 2014-05-24 DIAGNOSIS — J984 Other disorders of lung: Secondary | ICD-10-CM | POA: Diagnosis not present

## 2014-05-24 DIAGNOSIS — R6510 Systemic inflammatory response syndrome (SIRS) of non-infectious origin without acute organ dysfunction: Secondary | ICD-10-CM | POA: Diagnosis not present

## 2014-05-24 DIAGNOSIS — I10 Essential (primary) hypertension: Secondary | ICD-10-CM | POA: Diagnosis not present

## 2014-05-24 DIAGNOSIS — E119 Type 2 diabetes mellitus without complications: Secondary | ICD-10-CM | POA: Diagnosis present

## 2014-05-24 DIAGNOSIS — R5383 Other fatigue: Secondary | ICD-10-CM | POA: Diagnosis not present

## 2014-05-24 DIAGNOSIS — Z9981 Dependence on supplemental oxygen: Secondary | ICD-10-CM | POA: Diagnosis not present

## 2014-05-24 DIAGNOSIS — E785 Hyperlipidemia, unspecified: Secondary | ICD-10-CM | POA: Diagnosis present

## 2014-05-24 DIAGNOSIS — E039 Hypothyroidism, unspecified: Secondary | ICD-10-CM | POA: Diagnosis not present

## 2014-05-24 DIAGNOSIS — M4800 Spinal stenosis, site unspecified: Secondary | ICD-10-CM | POA: Diagnosis present

## 2014-05-24 DIAGNOSIS — R0609 Other forms of dyspnea: Secondary | ICD-10-CM | POA: Diagnosis not present

## 2014-05-24 DIAGNOSIS — J9600 Acute respiratory failure, unspecified whether with hypoxia or hypercapnia: Secondary | ICD-10-CM | POA: Diagnosis present

## 2014-05-24 DIAGNOSIS — R262 Difficulty in walking, not elsewhere classified: Secondary | ICD-10-CM | POA: Diagnosis not present

## 2014-05-24 DIAGNOSIS — I509 Heart failure, unspecified: Secondary | ICD-10-CM | POA: Diagnosis not present

## 2014-05-24 DIAGNOSIS — I5032 Chronic diastolic (congestive) heart failure: Secondary | ICD-10-CM | POA: Diagnosis not present

## 2014-05-24 DIAGNOSIS — F411 Generalized anxiety disorder: Secondary | ICD-10-CM | POA: Diagnosis present

## 2014-05-24 DIAGNOSIS — R0602 Shortness of breath: Secondary | ICD-10-CM | POA: Diagnosis not present

## 2014-05-24 LAB — PROTIME-INR
INR: 1.1
Prothrombin Time: 14.3 secs (ref 11.5–14.7)

## 2014-05-24 LAB — COMPREHENSIVE METABOLIC PANEL
ALBUMIN: 3.7 g/dL (ref 3.4–5.0)
ALT: 15 U/L
ANION GAP: 11 (ref 7–16)
Alkaline Phosphatase: 99 U/L
BUN: 6 mg/dL — ABNORMAL LOW (ref 7–18)
Bilirubin,Total: 1.2 mg/dL — ABNORMAL HIGH (ref 0.2–1.0)
Calcium, Total: 8.6 mg/dL (ref 8.5–10.1)
Chloride: 100 mmol/L (ref 98–107)
Co2: 24 mmol/L (ref 21–32)
Creatinine: 0.9 mg/dL (ref 0.60–1.30)
EGFR (African American): >60
EGFR (Non-African Amer.): 59 — ABNORMAL LOW
GLUCOSE: 145 mg/dL — AB (ref 65–99)
Osmolality: 270 (ref 275–301)
Potassium: 3.6 mmol/L (ref 3.5–5.1)
SGOT(AST): 19 U/L (ref 15–37)
Sodium: 135 mmol/L — ABNORMAL LOW (ref 136–145)
Total Protein: 7 g/dL (ref 6.4–8.2)

## 2014-05-24 LAB — CBC
HCT: 35.3 % (ref 35.0–47.0)
HGB: 11.2 g/dL — ABNORMAL LOW (ref 12.0–16.0)
MCH: 29.2 pg (ref 26.0–34.0)
MCHC: 31.7 g/dL — ABNORMAL LOW (ref 32.0–36.0)
MCV: 92 fL (ref 80–100)
PLATELETS: 199 10*3/uL (ref 150–440)
RBC: 3.83 10*6/uL (ref 3.80–5.20)
RDW: 14.6 % — AB (ref 11.5–14.5)
WBC: 15.1 10*3/uL — AB (ref 3.6–11.0)

## 2014-05-24 LAB — MAGNESIUM: Magnesium: 1.9 mg/dL

## 2014-05-24 LAB — URINALYSIS, COMPLETE
Bacteria: NONE SEEN
Bilirubin,UR: NEGATIVE
Blood: NEGATIVE
Glucose,UR: 50 mg/dL (ref 0–75)
KETONE: NEGATIVE
LEUKOCYTE ESTERASE: NEGATIVE
Nitrite: NEGATIVE
Ph: 7 (ref 4.5–8.0)
RBC,UR: <1 /HPF (ref 0–5)
SPECIFIC GRAVITY: 1.012 (ref 1.003–1.030)

## 2014-05-24 LAB — TSH: Thyroid Stimulating Horm: 5.16 u[IU]/mL — ABNORMAL HIGH

## 2014-05-24 LAB — TROPONIN I
Troponin-I: <0.02 ng/mL
Troponin-I: <0.02 ng/mL

## 2014-05-24 LAB — CK TOTAL AND CKMB (NOT AT ARMC)
CK, TOTAL: 67 U/L
CK, Total: 61 U/L
CK, Total: 61 U/L
CK-MB: 1.1 ng/mL (ref 0.5–3.6)
CK-MB: 1.1 ng/mL (ref 0.5–3.6)
CK-MB: 1.2 ng/mL (ref 0.5–3.6)

## 2014-05-24 LAB — PHOSPHORUS: PHOSPHORUS: 2.2 mg/dL — AB (ref 2.5–4.9)

## 2014-05-24 LAB — LIPASE, BLOOD: Lipase: 51 U/L — ABNORMAL LOW (ref 73–393)

## 2014-05-24 LAB — PRO B NATRIURETIC PEPTIDE: B-TYPE NATIURETIC PEPTID: 4027 pg/mL — AB (ref 0–450)

## 2014-05-25 LAB — BASIC METABOLIC PANEL
Anion Gap: 9 (ref 7–16)
BUN: 9 mg/dL (ref 7–18)
CHLORIDE: 101 mmol/L (ref 98–107)
Calcium, Total: 7.8 mg/dL — ABNORMAL LOW (ref 8.5–10.1)
Co2: 26 mmol/L (ref 21–32)
Creatinine: 0.93 mg/dL (ref 0.60–1.30)
GFR CALC NON AF AMER: 57 — AB
Glucose: 121 mg/dL — ABNORMAL HIGH (ref 65–99)
Osmolality: 272 (ref 275–301)
POTASSIUM: 3.3 mmol/L — AB (ref 3.5–5.1)
Sodium: 136 mmol/L (ref 136–145)

## 2014-05-25 LAB — CBC WITH DIFFERENTIAL/PLATELET
BASOS ABS: 0.1 10*3/uL (ref 0.0–0.1)
Basophil %: 0.5 %
Eosinophil #: 0.1 10*3/uL (ref 0.0–0.7)
Eosinophil %: 0.9 %
HCT: 27.8 % — ABNORMAL LOW (ref 35.0–47.0)
HGB: 9.4 g/dL — ABNORMAL LOW (ref 12.0–16.0)
LYMPHS ABS: 1.2 10*3/uL (ref 1.0–3.6)
LYMPHS PCT: 12.3 %
MCH: 30.4 pg (ref 26.0–34.0)
MCHC: 33.8 g/dL (ref 32.0–36.0)
MCV: 90 fL (ref 80–100)
Monocyte #: 0.6 x10 3/mm (ref 0.2–0.9)
Monocyte %: 6.3 %
Neutrophil #: 7.7 10*3/uL — ABNORMAL HIGH (ref 1.4–6.5)
Neutrophil %: 80 %
Platelet: 161 10*3/uL (ref 150–440)
RBC: 3.09 10*6/uL — ABNORMAL LOW (ref 3.80–5.20)
RDW: 14.5 % (ref 11.5–14.5)
WBC: 9.6 10*3/uL (ref 3.6–11.0)

## 2014-05-26 LAB — URINE CULTURE

## 2014-05-27 DIAGNOSIS — Z9981 Dependence on supplemental oxygen: Secondary | ICD-10-CM | POA: Diagnosis not present

## 2014-05-27 DIAGNOSIS — R0609 Other forms of dyspnea: Secondary | ICD-10-CM | POA: Diagnosis not present

## 2014-05-27 DIAGNOSIS — E039 Hypothyroidism, unspecified: Secondary | ICD-10-CM | POA: Diagnosis not present

## 2014-05-27 DIAGNOSIS — E871 Hypo-osmolality and hyponatremia: Secondary | ICD-10-CM | POA: Diagnosis not present

## 2014-05-27 DIAGNOSIS — I4891 Unspecified atrial fibrillation: Secondary | ICD-10-CM | POA: Diagnosis not present

## 2014-05-27 DIAGNOSIS — M6281 Muscle weakness (generalized): Secondary | ICD-10-CM | POA: Diagnosis not present

## 2014-05-27 DIAGNOSIS — J189 Pneumonia, unspecified organism: Secondary | ICD-10-CM | POA: Diagnosis not present

## 2014-05-27 DIAGNOSIS — I509 Heart failure, unspecified: Secondary | ICD-10-CM | POA: Diagnosis not present

## 2014-05-27 DIAGNOSIS — R5381 Other malaise: Secondary | ICD-10-CM | POA: Diagnosis not present

## 2014-05-27 DIAGNOSIS — I739 Peripheral vascular disease, unspecified: Secondary | ICD-10-CM | POA: Diagnosis not present

## 2014-05-27 DIAGNOSIS — J4489 Other specified chronic obstructive pulmonary disease: Secondary | ICD-10-CM | POA: Diagnosis not present

## 2014-05-27 DIAGNOSIS — R6510 Systemic inflammatory response syndrome (SIRS) of non-infectious origin without acute organ dysfunction: Secondary | ICD-10-CM | POA: Diagnosis not present

## 2014-05-27 DIAGNOSIS — R262 Difficulty in walking, not elsewhere classified: Secondary | ICD-10-CM | POA: Diagnosis not present

## 2014-05-27 DIAGNOSIS — E119 Type 2 diabetes mellitus without complications: Secondary | ICD-10-CM | POA: Diagnosis not present

## 2014-05-27 DIAGNOSIS — J449 Chronic obstructive pulmonary disease, unspecified: Secondary | ICD-10-CM | POA: Diagnosis not present

## 2014-05-27 DIAGNOSIS — I5032 Chronic diastolic (congestive) heart failure: Secondary | ICD-10-CM | POA: Diagnosis not present

## 2014-05-27 DIAGNOSIS — R5383 Other fatigue: Secondary | ICD-10-CM | POA: Diagnosis not present

## 2014-05-27 DIAGNOSIS — R21 Rash and other nonspecific skin eruption: Secondary | ICD-10-CM | POA: Diagnosis not present

## 2014-05-27 DIAGNOSIS — J441 Chronic obstructive pulmonary disease with (acute) exacerbation: Secondary | ICD-10-CM | POA: Diagnosis not present

## 2014-05-27 DIAGNOSIS — R0989 Other specified symptoms and signs involving the circulatory and respiratory systems: Secondary | ICD-10-CM | POA: Diagnosis not present

## 2014-05-27 DIAGNOSIS — I8500 Esophageal varices without bleeding: Secondary | ICD-10-CM | POA: Diagnosis not present

## 2014-05-27 LAB — BASIC METABOLIC PANEL
Anion Gap: 10 (ref 7–16)
BUN: 10 mg/dL (ref 7–18)
Calcium, Total: 9 mg/dL (ref 8.5–10.1)
Chloride: 102 mmol/L (ref 98–107)
Co2: 26 mmol/L (ref 21–32)
Creatinine: 0.87 mg/dL (ref 0.60–1.30)
GLUCOSE: 226 mg/dL — AB (ref 65–99)
Osmolality: 282 (ref 275–301)
Potassium: 4.4 mmol/L (ref 3.5–5.1)
Sodium: 138 mmol/L (ref 136–145)

## 2014-05-29 DIAGNOSIS — F3289 Other specified depressive episodes: Secondary | ICD-10-CM

## 2014-05-29 DIAGNOSIS — E119 Type 2 diabetes mellitus without complications: Secondary | ICD-10-CM | POA: Diagnosis not present

## 2014-05-29 DIAGNOSIS — I4891 Unspecified atrial fibrillation: Secondary | ICD-10-CM

## 2014-05-29 DIAGNOSIS — J441 Chronic obstructive pulmonary disease with (acute) exacerbation: Secondary | ICD-10-CM

## 2014-05-29 DIAGNOSIS — I5032 Chronic diastolic (congestive) heart failure: Secondary | ICD-10-CM | POA: Diagnosis not present

## 2014-05-29 DIAGNOSIS — F329 Major depressive disorder, single episode, unspecified: Secondary | ICD-10-CM

## 2014-05-29 DIAGNOSIS — K219 Gastro-esophageal reflux disease without esophagitis: Secondary | ICD-10-CM

## 2014-05-29 DIAGNOSIS — I8500 Esophageal varices without bleeding: Secondary | ICD-10-CM | POA: Diagnosis not present

## 2014-05-29 LAB — CULTURE, BLOOD (SINGLE)

## 2014-06-17 DIAGNOSIS — E119 Type 2 diabetes mellitus without complications: Secondary | ICD-10-CM

## 2014-06-17 DIAGNOSIS — I5032 Chronic diastolic (congestive) heart failure: Secondary | ICD-10-CM | POA: Diagnosis not present

## 2014-06-17 DIAGNOSIS — J189 Pneumonia, unspecified organism: Secondary | ICD-10-CM

## 2014-06-17 DIAGNOSIS — J449 Chronic obstructive pulmonary disease, unspecified: Secondary | ICD-10-CM | POA: Diagnosis not present

## 2014-06-17 DIAGNOSIS — I4891 Unspecified atrial fibrillation: Secondary | ICD-10-CM | POA: Diagnosis not present

## 2014-06-26 DIAGNOSIS — R21 Rash and other nonspecific skin eruption: Secondary | ICD-10-CM

## 2014-07-04 DIAGNOSIS — J449 Chronic obstructive pulmonary disease, unspecified: Secondary | ICD-10-CM | POA: Diagnosis not present

## 2014-07-04 DIAGNOSIS — Z8701 Personal history of pneumonia (recurrent): Secondary | ICD-10-CM | POA: Diagnosis not present

## 2014-07-04 DIAGNOSIS — I10 Essential (primary) hypertension: Secondary | ICD-10-CM | POA: Diagnosis not present

## 2014-07-04 DIAGNOSIS — E119 Type 2 diabetes mellitus without complications: Secondary | ICD-10-CM | POA: Diagnosis not present

## 2014-07-04 DIAGNOSIS — M150 Primary generalized (osteo)arthritis: Secondary | ICD-10-CM | POA: Diagnosis not present

## 2014-07-04 DIAGNOSIS — I4891 Unspecified atrial fibrillation: Secondary | ICD-10-CM | POA: Diagnosis not present

## 2014-07-04 DIAGNOSIS — I509 Heart failure, unspecified: Secondary | ICD-10-CM | POA: Diagnosis not present

## 2014-07-07 DIAGNOSIS — E039 Hypothyroidism, unspecified: Secondary | ICD-10-CM | POA: Diagnosis not present

## 2014-07-07 DIAGNOSIS — Z23 Encounter for immunization: Secondary | ICD-10-CM | POA: Diagnosis not present

## 2014-07-07 DIAGNOSIS — I509 Heart failure, unspecified: Secondary | ICD-10-CM | POA: Diagnosis not present

## 2014-07-07 DIAGNOSIS — J189 Pneumonia, unspecified organism: Secondary | ICD-10-CM | POA: Diagnosis not present

## 2014-07-07 DIAGNOSIS — I10 Essential (primary) hypertension: Secondary | ICD-10-CM | POA: Diagnosis not present

## 2014-07-07 DIAGNOSIS — E119 Type 2 diabetes mellitus without complications: Secondary | ICD-10-CM | POA: Diagnosis not present

## 2014-07-07 DIAGNOSIS — Z79899 Other long term (current) drug therapy: Secondary | ICD-10-CM | POA: Diagnosis not present

## 2014-07-07 DIAGNOSIS — M150 Primary generalized (osteo)arthritis: Secondary | ICD-10-CM | POA: Diagnosis not present

## 2014-07-07 DIAGNOSIS — J449 Chronic obstructive pulmonary disease, unspecified: Secondary | ICD-10-CM | POA: Diagnosis not present

## 2014-07-07 DIAGNOSIS — I4891 Unspecified atrial fibrillation: Secondary | ICD-10-CM | POA: Diagnosis not present

## 2014-07-08 DIAGNOSIS — I509 Heart failure, unspecified: Secondary | ICD-10-CM | POA: Diagnosis not present

## 2014-07-08 DIAGNOSIS — I10 Essential (primary) hypertension: Secondary | ICD-10-CM | POA: Diagnosis not present

## 2014-07-08 DIAGNOSIS — M150 Primary generalized (osteo)arthritis: Secondary | ICD-10-CM | POA: Diagnosis not present

## 2014-07-08 DIAGNOSIS — E119 Type 2 diabetes mellitus without complications: Secondary | ICD-10-CM | POA: Diagnosis not present

## 2014-07-08 DIAGNOSIS — I4891 Unspecified atrial fibrillation: Secondary | ICD-10-CM | POA: Diagnosis not present

## 2014-07-08 DIAGNOSIS — J449 Chronic obstructive pulmonary disease, unspecified: Secondary | ICD-10-CM | POA: Diagnosis not present

## 2014-07-09 DIAGNOSIS — E119 Type 2 diabetes mellitus without complications: Secondary | ICD-10-CM | POA: Diagnosis not present

## 2014-07-09 DIAGNOSIS — M150 Primary generalized (osteo)arthritis: Secondary | ICD-10-CM | POA: Diagnosis not present

## 2014-07-09 DIAGNOSIS — I10 Essential (primary) hypertension: Secondary | ICD-10-CM | POA: Diagnosis not present

## 2014-07-09 DIAGNOSIS — I509 Heart failure, unspecified: Secondary | ICD-10-CM | POA: Diagnosis not present

## 2014-07-09 DIAGNOSIS — J449 Chronic obstructive pulmonary disease, unspecified: Secondary | ICD-10-CM | POA: Diagnosis not present

## 2014-07-09 DIAGNOSIS — I4891 Unspecified atrial fibrillation: Secondary | ICD-10-CM | POA: Diagnosis not present

## 2014-07-10 DIAGNOSIS — J449 Chronic obstructive pulmonary disease, unspecified: Secondary | ICD-10-CM | POA: Diagnosis not present

## 2014-07-10 DIAGNOSIS — I4891 Unspecified atrial fibrillation: Secondary | ICD-10-CM | POA: Diagnosis not present

## 2014-07-10 DIAGNOSIS — M150 Primary generalized (osteo)arthritis: Secondary | ICD-10-CM | POA: Diagnosis not present

## 2014-07-10 DIAGNOSIS — I509 Heart failure, unspecified: Secondary | ICD-10-CM | POA: Diagnosis not present

## 2014-07-10 DIAGNOSIS — E119 Type 2 diabetes mellitus without complications: Secondary | ICD-10-CM | POA: Diagnosis not present

## 2014-07-10 DIAGNOSIS — I10 Essential (primary) hypertension: Secondary | ICD-10-CM | POA: Diagnosis not present

## 2014-07-11 DIAGNOSIS — I10 Essential (primary) hypertension: Secondary | ICD-10-CM | POA: Diagnosis not present

## 2014-07-11 DIAGNOSIS — M150 Primary generalized (osteo)arthritis: Secondary | ICD-10-CM | POA: Diagnosis not present

## 2014-07-11 DIAGNOSIS — J449 Chronic obstructive pulmonary disease, unspecified: Secondary | ICD-10-CM | POA: Diagnosis not present

## 2014-07-11 DIAGNOSIS — I4891 Unspecified atrial fibrillation: Secondary | ICD-10-CM | POA: Diagnosis not present

## 2014-07-11 DIAGNOSIS — I509 Heart failure, unspecified: Secondary | ICD-10-CM | POA: Diagnosis not present

## 2014-07-11 DIAGNOSIS — E119 Type 2 diabetes mellitus without complications: Secondary | ICD-10-CM | POA: Diagnosis not present

## 2014-07-15 DIAGNOSIS — E119 Type 2 diabetes mellitus without complications: Secondary | ICD-10-CM | POA: Diagnosis not present

## 2014-07-15 DIAGNOSIS — M150 Primary generalized (osteo)arthritis: Secondary | ICD-10-CM | POA: Diagnosis not present

## 2014-07-15 DIAGNOSIS — I509 Heart failure, unspecified: Secondary | ICD-10-CM | POA: Diagnosis not present

## 2014-07-15 DIAGNOSIS — I4891 Unspecified atrial fibrillation: Secondary | ICD-10-CM | POA: Diagnosis not present

## 2014-07-15 DIAGNOSIS — I10 Essential (primary) hypertension: Secondary | ICD-10-CM | POA: Diagnosis not present

## 2014-07-15 DIAGNOSIS — J449 Chronic obstructive pulmonary disease, unspecified: Secondary | ICD-10-CM | POA: Diagnosis not present

## 2014-07-16 DIAGNOSIS — I4891 Unspecified atrial fibrillation: Secondary | ICD-10-CM | POA: Diagnosis not present

## 2014-07-16 DIAGNOSIS — E119 Type 2 diabetes mellitus without complications: Secondary | ICD-10-CM | POA: Diagnosis not present

## 2014-07-16 DIAGNOSIS — J449 Chronic obstructive pulmonary disease, unspecified: Secondary | ICD-10-CM | POA: Diagnosis not present

## 2014-07-16 DIAGNOSIS — I509 Heart failure, unspecified: Secondary | ICD-10-CM | POA: Diagnosis not present

## 2014-07-16 DIAGNOSIS — M150 Primary generalized (osteo)arthritis: Secondary | ICD-10-CM | POA: Diagnosis not present

## 2014-07-16 DIAGNOSIS — I10 Essential (primary) hypertension: Secondary | ICD-10-CM | POA: Diagnosis not present

## 2014-07-21 DIAGNOSIS — I509 Heart failure, unspecified: Secondary | ICD-10-CM | POA: Diagnosis not present

## 2014-07-21 DIAGNOSIS — M150 Primary generalized (osteo)arthritis: Secondary | ICD-10-CM | POA: Diagnosis not present

## 2014-07-21 DIAGNOSIS — J449 Chronic obstructive pulmonary disease, unspecified: Secondary | ICD-10-CM | POA: Diagnosis not present

## 2014-07-21 DIAGNOSIS — I4891 Unspecified atrial fibrillation: Secondary | ICD-10-CM | POA: Diagnosis not present

## 2014-07-21 DIAGNOSIS — I10 Essential (primary) hypertension: Secondary | ICD-10-CM | POA: Diagnosis not present

## 2014-07-21 DIAGNOSIS — E119 Type 2 diabetes mellitus without complications: Secondary | ICD-10-CM | POA: Diagnosis not present

## 2014-07-22 DIAGNOSIS — I748 Embolism and thrombosis of other arteries: Secondary | ICD-10-CM | POA: Diagnosis not present

## 2014-07-22 DIAGNOSIS — I482 Chronic atrial fibrillation: Secondary | ICD-10-CM | POA: Diagnosis not present

## 2014-07-22 DIAGNOSIS — I10 Essential (primary) hypertension: Secondary | ICD-10-CM | POA: Diagnosis not present

## 2014-07-22 DIAGNOSIS — I5032 Chronic diastolic (congestive) heart failure: Secondary | ICD-10-CM | POA: Diagnosis not present

## 2014-07-23 DIAGNOSIS — J449 Chronic obstructive pulmonary disease, unspecified: Secondary | ICD-10-CM | POA: Diagnosis not present

## 2014-07-23 DIAGNOSIS — E119 Type 2 diabetes mellitus without complications: Secondary | ICD-10-CM | POA: Diagnosis not present

## 2014-07-23 DIAGNOSIS — I4891 Unspecified atrial fibrillation: Secondary | ICD-10-CM | POA: Diagnosis not present

## 2014-07-23 DIAGNOSIS — I509 Heart failure, unspecified: Secondary | ICD-10-CM | POA: Diagnosis not present

## 2014-07-23 DIAGNOSIS — M150 Primary generalized (osteo)arthritis: Secondary | ICD-10-CM | POA: Diagnosis not present

## 2014-07-23 DIAGNOSIS — I10 Essential (primary) hypertension: Secondary | ICD-10-CM | POA: Diagnosis not present

## 2014-07-29 DIAGNOSIS — J449 Chronic obstructive pulmonary disease, unspecified: Secondary | ICD-10-CM | POA: Diagnosis not present

## 2014-07-29 DIAGNOSIS — M150 Primary generalized (osteo)arthritis: Secondary | ICD-10-CM | POA: Diagnosis not present

## 2014-07-29 DIAGNOSIS — I10 Essential (primary) hypertension: Secondary | ICD-10-CM | POA: Diagnosis not present

## 2014-07-29 DIAGNOSIS — E119 Type 2 diabetes mellitus without complications: Secondary | ICD-10-CM | POA: Diagnosis not present

## 2014-07-29 DIAGNOSIS — I509 Heart failure, unspecified: Secondary | ICD-10-CM | POA: Diagnosis not present

## 2014-07-29 DIAGNOSIS — I4891 Unspecified atrial fibrillation: Secondary | ICD-10-CM | POA: Diagnosis not present

## 2014-08-01 DIAGNOSIS — I10 Essential (primary) hypertension: Secondary | ICD-10-CM | POA: Diagnosis not present

## 2014-08-01 DIAGNOSIS — E119 Type 2 diabetes mellitus without complications: Secondary | ICD-10-CM | POA: Diagnosis not present

## 2014-08-01 DIAGNOSIS — M150 Primary generalized (osteo)arthritis: Secondary | ICD-10-CM | POA: Diagnosis not present

## 2014-08-01 DIAGNOSIS — I4891 Unspecified atrial fibrillation: Secondary | ICD-10-CM | POA: Diagnosis not present

## 2014-08-01 DIAGNOSIS — I509 Heart failure, unspecified: Secondary | ICD-10-CM | POA: Diagnosis not present

## 2014-08-01 DIAGNOSIS — J449 Chronic obstructive pulmonary disease, unspecified: Secondary | ICD-10-CM | POA: Diagnosis not present

## 2014-08-06 DIAGNOSIS — I4891 Unspecified atrial fibrillation: Secondary | ICD-10-CM | POA: Diagnosis not present

## 2014-08-06 DIAGNOSIS — I509 Heart failure, unspecified: Secondary | ICD-10-CM | POA: Diagnosis not present

## 2014-08-06 DIAGNOSIS — M150 Primary generalized (osteo)arthritis: Secondary | ICD-10-CM | POA: Diagnosis not present

## 2014-08-06 DIAGNOSIS — I10 Essential (primary) hypertension: Secondary | ICD-10-CM | POA: Diagnosis not present

## 2014-08-06 DIAGNOSIS — E119 Type 2 diabetes mellitus without complications: Secondary | ICD-10-CM | POA: Diagnosis not present

## 2014-08-06 DIAGNOSIS — J449 Chronic obstructive pulmonary disease, unspecified: Secondary | ICD-10-CM | POA: Diagnosis not present

## 2014-08-07 DIAGNOSIS — I519 Heart disease, unspecified: Secondary | ICD-10-CM | POA: Diagnosis not present

## 2014-08-07 DIAGNOSIS — I4891 Unspecified atrial fibrillation: Secondary | ICD-10-CM | POA: Diagnosis not present

## 2014-08-07 DIAGNOSIS — R609 Edema, unspecified: Secondary | ICD-10-CM | POA: Diagnosis not present

## 2014-08-07 DIAGNOSIS — I10 Essential (primary) hypertension: Secondary | ICD-10-CM | POA: Diagnosis not present

## 2014-08-11 DIAGNOSIS — I4891 Unspecified atrial fibrillation: Secondary | ICD-10-CM | POA: Diagnosis not present

## 2014-08-11 DIAGNOSIS — M150 Primary generalized (osteo)arthritis: Secondary | ICD-10-CM | POA: Diagnosis not present

## 2014-08-11 DIAGNOSIS — J449 Chronic obstructive pulmonary disease, unspecified: Secondary | ICD-10-CM | POA: Diagnosis not present

## 2014-08-11 DIAGNOSIS — I10 Essential (primary) hypertension: Secondary | ICD-10-CM | POA: Diagnosis not present

## 2014-08-11 DIAGNOSIS — I509 Heart failure, unspecified: Secondary | ICD-10-CM | POA: Diagnosis not present

## 2014-08-11 DIAGNOSIS — E119 Type 2 diabetes mellitus without complications: Secondary | ICD-10-CM | POA: Diagnosis not present

## 2014-08-26 ENCOUNTER — Ambulatory Visit: Payer: Self-pay

## 2014-08-26 DIAGNOSIS — J449 Chronic obstructive pulmonary disease, unspecified: Secondary | ICD-10-CM | POA: Diagnosis not present

## 2014-08-26 DIAGNOSIS — I509 Heart failure, unspecified: Secondary | ICD-10-CM | POA: Diagnosis not present

## 2014-08-26 DIAGNOSIS — J209 Acute bronchitis, unspecified: Secondary | ICD-10-CM | POA: Diagnosis not present

## 2014-08-26 DIAGNOSIS — R0602 Shortness of breath: Secondary | ICD-10-CM | POA: Diagnosis not present

## 2014-08-26 DIAGNOSIS — Z8701 Personal history of pneumonia (recurrent): Secondary | ICD-10-CM | POA: Diagnosis not present

## 2014-08-26 DIAGNOSIS — Z8679 Personal history of other diseases of the circulatory system: Secondary | ICD-10-CM | POA: Diagnosis not present

## 2014-08-26 DIAGNOSIS — R0989 Other specified symptoms and signs involving the circulatory and respiratory systems: Secondary | ICD-10-CM | POA: Diagnosis not present

## 2014-08-26 DIAGNOSIS — R05 Cough: Secondary | ICD-10-CM | POA: Diagnosis not present

## 2014-08-26 DIAGNOSIS — J441 Chronic obstructive pulmonary disease with (acute) exacerbation: Secondary | ICD-10-CM | POA: Diagnosis not present

## 2014-08-26 LAB — COMPREHENSIVE METABOLIC PANEL
ALBUMIN: 3.6 g/dL (ref 3.4–5.0)
ALK PHOS: 98 U/L
Anion Gap: 8 (ref 7–16)
BUN: 12 mg/dL (ref 7–18)
Bilirubin,Total: 1.2 mg/dL — ABNORMAL HIGH (ref 0.2–1.0)
CALCIUM: 8.5 mg/dL (ref 8.5–10.1)
CHLORIDE: 107 mmol/L (ref 98–107)
CREATININE: 0.88 mg/dL (ref 0.60–1.30)
Co2: 25 mmol/L (ref 21–32)
EGFR (Non-African Amer.): >60
Glucose: 124 mg/dL — ABNORMAL HIGH (ref 65–99)
Osmolality: 281 (ref 275–301)
Potassium: 3.5 mmol/L (ref 3.5–5.1)
SGOT(AST): 18 U/L (ref 15–37)
SGPT (ALT): 16 U/L
SODIUM: 140 mmol/L (ref 136–145)
TOTAL PROTEIN: 6.8 g/dL (ref 6.4–8.2)

## 2014-08-26 LAB — CBC WITH DIFFERENTIAL/PLATELET
Basophil #: 0.1 10*3/uL (ref 0.0–0.1)
Basophil %: 0.6 %
EOS PCT: 1.9 %
Eosinophil #: 0.2 10*3/uL (ref 0.0–0.7)
HCT: 33.6 % — ABNORMAL LOW (ref 35.0–47.0)
HGB: 10.8 g/dL — ABNORMAL LOW (ref 12.0–16.0)
LYMPHS ABS: 1.3 10*3/uL (ref 1.0–3.6)
LYMPHS PCT: 12.5 %
MCH: 29.2 pg (ref 26.0–34.0)
MCHC: 32 g/dL (ref 32.0–36.0)
MCV: 91 fL (ref 80–100)
MONOS PCT: 7 %
Monocyte #: 0.7 x10 3/mm (ref 0.2–0.9)
NEUTROS ABS: 8 10*3/uL — AB (ref 1.4–6.5)
NEUTROS PCT: 78 %
PLATELETS: 184 10*3/uL (ref 150–440)
RBC: 3.68 10*6/uL — ABNORMAL LOW (ref 3.80–5.20)
RDW: 14.3 % (ref 11.5–14.5)
WBC: 10.3 10*3/uL (ref 3.6–11.0)

## 2014-08-26 LAB — PRO B NATRIURETIC PEPTIDE: B-Type Natriuretic Peptide: 3586 pg/mL — ABNORMAL HIGH (ref 0–450)

## 2014-08-27 DIAGNOSIS — I10 Essential (primary) hypertension: Secondary | ICD-10-CM | POA: Diagnosis not present

## 2014-08-27 DIAGNOSIS — J449 Chronic obstructive pulmonary disease, unspecified: Secondary | ICD-10-CM | POA: Diagnosis not present

## 2014-08-27 DIAGNOSIS — E119 Type 2 diabetes mellitus without complications: Secondary | ICD-10-CM | POA: Diagnosis not present

## 2014-08-27 DIAGNOSIS — I509 Heart failure, unspecified: Secondary | ICD-10-CM | POA: Diagnosis not present

## 2014-08-27 DIAGNOSIS — I4891 Unspecified atrial fibrillation: Secondary | ICD-10-CM | POA: Diagnosis not present

## 2014-08-27 DIAGNOSIS — M150 Primary generalized (osteo)arthritis: Secondary | ICD-10-CM | POA: Diagnosis not present

## 2014-09-01 DIAGNOSIS — E119 Type 2 diabetes mellitus without complications: Secondary | ICD-10-CM | POA: Diagnosis not present

## 2014-09-01 DIAGNOSIS — M150 Primary generalized (osteo)arthritis: Secondary | ICD-10-CM | POA: Diagnosis not present

## 2014-09-01 DIAGNOSIS — I10 Essential (primary) hypertension: Secondary | ICD-10-CM | POA: Diagnosis not present

## 2014-09-01 DIAGNOSIS — I509 Heart failure, unspecified: Secondary | ICD-10-CM | POA: Diagnosis not present

## 2014-09-01 DIAGNOSIS — I4891 Unspecified atrial fibrillation: Secondary | ICD-10-CM | POA: Diagnosis not present

## 2014-09-01 DIAGNOSIS — J449 Chronic obstructive pulmonary disease, unspecified: Secondary | ICD-10-CM | POA: Diagnosis not present

## 2014-09-02 DIAGNOSIS — E119 Type 2 diabetes mellitus without complications: Secondary | ICD-10-CM | POA: Diagnosis not present

## 2014-09-02 DIAGNOSIS — I10 Essential (primary) hypertension: Secondary | ICD-10-CM | POA: Diagnosis not present

## 2014-09-02 DIAGNOSIS — J449 Chronic obstructive pulmonary disease, unspecified: Secondary | ICD-10-CM | POA: Diagnosis not present

## 2014-09-02 DIAGNOSIS — Z8701 Personal history of pneumonia (recurrent): Secondary | ICD-10-CM | POA: Diagnosis not present

## 2014-09-02 DIAGNOSIS — M150 Primary generalized (osteo)arthritis: Secondary | ICD-10-CM | POA: Diagnosis not present

## 2014-09-02 DIAGNOSIS — I509 Heart failure, unspecified: Secondary | ICD-10-CM | POA: Diagnosis not present

## 2014-09-02 DIAGNOSIS — I4891 Unspecified atrial fibrillation: Secondary | ICD-10-CM | POA: Diagnosis not present

## 2014-09-10 DIAGNOSIS — I4891 Unspecified atrial fibrillation: Secondary | ICD-10-CM | POA: Diagnosis not present

## 2014-09-10 DIAGNOSIS — E119 Type 2 diabetes mellitus without complications: Secondary | ICD-10-CM | POA: Diagnosis not present

## 2014-09-10 DIAGNOSIS — M150 Primary generalized (osteo)arthritis: Secondary | ICD-10-CM | POA: Diagnosis not present

## 2014-09-10 DIAGNOSIS — J449 Chronic obstructive pulmonary disease, unspecified: Secondary | ICD-10-CM | POA: Diagnosis not present

## 2014-09-10 DIAGNOSIS — I10 Essential (primary) hypertension: Secondary | ICD-10-CM | POA: Diagnosis not present

## 2014-09-10 DIAGNOSIS — I509 Heart failure, unspecified: Secondary | ICD-10-CM | POA: Diagnosis not present

## 2014-09-15 ENCOUNTER — Inpatient Hospital Stay: Payer: Self-pay | Admitting: Internal Medicine

## 2014-09-15 DIAGNOSIS — R05 Cough: Secondary | ICD-10-CM | POA: Diagnosis not present

## 2014-09-15 DIAGNOSIS — Z792 Long term (current) use of antibiotics: Secondary | ICD-10-CM | POA: Diagnosis not present

## 2014-09-15 DIAGNOSIS — I679 Cerebrovascular disease, unspecified: Secondary | ICD-10-CM | POA: Diagnosis present

## 2014-09-15 DIAGNOSIS — M1990 Unspecified osteoarthritis, unspecified site: Secondary | ICD-10-CM | POA: Diagnosis present

## 2014-09-15 DIAGNOSIS — J9600 Acute respiratory failure, unspecified whether with hypoxia or hypercapnia: Secondary | ICD-10-CM | POA: Diagnosis not present

## 2014-09-15 DIAGNOSIS — J449 Chronic obstructive pulmonary disease, unspecified: Secondary | ICD-10-CM | POA: Diagnosis not present

## 2014-09-15 DIAGNOSIS — E785 Hyperlipidemia, unspecified: Secondary | ICD-10-CM | POA: Diagnosis present

## 2014-09-15 DIAGNOSIS — I10 Essential (primary) hypertension: Secondary | ICD-10-CM | POA: Diagnosis not present

## 2014-09-15 DIAGNOSIS — Z79891 Long term (current) use of opiate analgesic: Secondary | ICD-10-CM | POA: Diagnosis not present

## 2014-09-15 DIAGNOSIS — I5032 Chronic diastolic (congestive) heart failure: Secondary | ICD-10-CM | POA: Diagnosis not present

## 2014-09-15 DIAGNOSIS — A419 Sepsis, unspecified organism: Secondary | ICD-10-CM | POA: Diagnosis not present

## 2014-09-15 DIAGNOSIS — I482 Chronic atrial fibrillation: Secondary | ICD-10-CM | POA: Diagnosis not present

## 2014-09-15 DIAGNOSIS — Z79899 Other long term (current) drug therapy: Secondary | ICD-10-CM | POA: Diagnosis not present

## 2014-09-15 DIAGNOSIS — M810 Age-related osteoporosis without current pathological fracture: Secondary | ICD-10-CM | POA: Diagnosis present

## 2014-09-15 DIAGNOSIS — G629 Polyneuropathy, unspecified: Secondary | ICD-10-CM | POA: Diagnosis present

## 2014-09-15 DIAGNOSIS — M4800 Spinal stenosis, site unspecified: Secondary | ICD-10-CM | POA: Diagnosis present

## 2014-09-15 DIAGNOSIS — J440 Chronic obstructive pulmonary disease with acute lower respiratory infection: Secondary | ICD-10-CM | POA: Diagnosis present

## 2014-09-15 DIAGNOSIS — J209 Acute bronchitis, unspecified: Secondary | ICD-10-CM | POA: Diagnosis not present

## 2014-09-15 DIAGNOSIS — E119 Type 2 diabetes mellitus without complications: Secondary | ICD-10-CM | POA: Diagnosis not present

## 2014-09-15 DIAGNOSIS — R0902 Hypoxemia: Secondary | ICD-10-CM | POA: Diagnosis present

## 2014-09-15 DIAGNOSIS — R0689 Other abnormalities of breathing: Secondary | ICD-10-CM | POA: Diagnosis not present

## 2014-09-15 DIAGNOSIS — Z8701 Personal history of pneumonia (recurrent): Secondary | ICD-10-CM | POA: Diagnosis not present

## 2014-09-15 DIAGNOSIS — R0789 Other chest pain: Secondary | ICD-10-CM | POA: Diagnosis not present

## 2014-09-15 DIAGNOSIS — E039 Hypothyroidism, unspecified: Secondary | ICD-10-CM | POA: Diagnosis not present

## 2014-09-15 DIAGNOSIS — I701 Atherosclerosis of renal artery: Secondary | ICD-10-CM | POA: Diagnosis present

## 2014-09-15 DIAGNOSIS — R0602 Shortness of breath: Secondary | ICD-10-CM | POA: Diagnosis not present

## 2014-09-15 DIAGNOSIS — Z96651 Presence of right artificial knee joint: Secondary | ICD-10-CM | POA: Diagnosis present

## 2014-09-15 DIAGNOSIS — I748 Embolism and thrombosis of other arteries: Secondary | ICD-10-CM | POA: Diagnosis not present

## 2014-09-15 DIAGNOSIS — J439 Emphysema, unspecified: Secondary | ICD-10-CM | POA: Diagnosis not present

## 2014-09-15 DIAGNOSIS — F329 Major depressive disorder, single episode, unspecified: Secondary | ICD-10-CM | POA: Diagnosis present

## 2014-09-15 DIAGNOSIS — I739 Peripheral vascular disease, unspecified: Secondary | ICD-10-CM | POA: Diagnosis present

## 2014-09-15 DIAGNOSIS — I700 Atherosclerosis of aorta: Secondary | ICD-10-CM | POA: Diagnosis not present

## 2014-09-15 DIAGNOSIS — J189 Pneumonia, unspecified organism: Secondary | ICD-10-CM | POA: Diagnosis not present

## 2014-09-15 DIAGNOSIS — Z8249 Family history of ischemic heart disease and other diseases of the circulatory system: Secondary | ICD-10-CM | POA: Diagnosis not present

## 2014-09-15 DIAGNOSIS — R918 Other nonspecific abnormal finding of lung field: Secondary | ICD-10-CM | POA: Diagnosis not present

## 2014-09-15 DIAGNOSIS — I517 Cardiomegaly: Secondary | ICD-10-CM | POA: Diagnosis not present

## 2014-09-15 LAB — COMPREHENSIVE METABOLIC PANEL
Albumin: 3.6 g/dL (ref 3.4–5.0)
Alkaline Phosphatase: 82 U/L
Anion Gap: 8 (ref 7–16)
BILIRUBIN TOTAL: 0.4 mg/dL (ref 0.2–1.0)
BUN: 10 mg/dL (ref 7–18)
CO2: 26 mmol/L (ref 21–32)
CREATININE: 0.89 mg/dL (ref 0.60–1.30)
Calcium, Total: 8.4 mg/dL — ABNORMAL LOW (ref 8.5–10.1)
Chloride: 105 mmol/L (ref 98–107)
EGFR (African American): >60
GLUCOSE: 142 mg/dL — AB (ref 65–99)
OSMOLALITY: 279 (ref 275–301)
Potassium: 3.4 mmol/L — ABNORMAL LOW (ref 3.5–5.1)
SGOT(AST): 15 U/L (ref 15–37)
SGPT (ALT): 16 U/L
Sodium: 139 mmol/L (ref 136–145)
Total Protein: 6.4 g/dL (ref 6.4–8.2)

## 2014-09-15 LAB — CBC WITH DIFFERENTIAL/PLATELET
Basophil #: 0 10*3/uL (ref 0.0–0.1)
Basophil %: 0.4 %
Eosinophil #: 0 10*3/uL (ref 0.0–0.7)
Eosinophil %: 0.3 %
HCT: 38.2 % (ref 35.0–47.0)
HGB: 12.3 g/dL (ref 12.0–16.0)
LYMPHS PCT: 20.2 %
Lymphocyte #: 1.7 10*3/uL (ref 1.0–3.6)
MCH: 29 pg (ref 26.0–34.0)
MCHC: 32.3 g/dL (ref 32.0–36.0)
MCV: 90 fL (ref 80–100)
MONO ABS: 0.7 x10 3/mm (ref 0.2–0.9)
MONOS PCT: 8.6 %
Neutrophil #: 6.1 10*3/uL (ref 1.4–6.5)
Neutrophil %: 70.5 %
Platelet: 125 10*3/uL — ABNORMAL LOW (ref 150–440)
RBC: 4.25 10*6/uL (ref 3.80–5.20)
RDW: 14.6 % — ABNORMAL HIGH (ref 11.5–14.5)
WBC: 8.6 10*3/uL (ref 3.6–11.0)

## 2014-09-15 LAB — URINALYSIS, COMPLETE
BLOOD: NEGATIVE
Bacteria: NONE SEEN
Bilirubin,UR: NEGATIVE
Glucose,UR: NEGATIVE mg/dL (ref 0–75)
Ketone: NEGATIVE
LEUKOCYTE ESTERASE: NEGATIVE
Nitrite: NEGATIVE
Ph: 7 (ref 4.5–8.0)
Protein: NEGATIVE
SPECIFIC GRAVITY: 1.008 (ref 1.003–1.030)
WBC UR: <1 /HPF (ref 0–5)

## 2014-09-15 LAB — PROTIME-INR
INR: 1
Prothrombin Time: 12.8 secs (ref 11.5–14.7)

## 2014-09-15 LAB — TROPONIN I: Troponin-I: <0.02 ng/mL

## 2014-09-16 LAB — BASIC METABOLIC PANEL
ANION GAP: 7 (ref 7–16)
BUN: 12 mg/dL (ref 7–18)
CALCIUM: 8.9 mg/dL (ref 8.5–10.1)
CO2: 26 mmol/L (ref 21–32)
Chloride: 105 mmol/L (ref 98–107)
Creatinine: 0.78 mg/dL (ref 0.60–1.30)
EGFR (Non-African Amer.): >60
Glucose: 205 mg/dL — ABNORMAL HIGH (ref 65–99)
OSMOLALITY: 281 (ref 275–301)
POTASSIUM: 4.1 mmol/L (ref 3.5–5.1)
Sodium: 138 mmol/L (ref 136–145)

## 2014-09-16 LAB — CBC WITH DIFFERENTIAL/PLATELET
BASOS ABS: 0 10*3/uL (ref 0.0–0.1)
Basophil %: 0.1 %
EOS PCT: 0 %
Eosinophil #: 0 10*3/uL (ref 0.0–0.7)
HCT: 35.8 % (ref 35.0–47.0)
HGB: 11.4 g/dL — ABNORMAL LOW (ref 12.0–16.0)
LYMPHS ABS: 0.6 10*3/uL — AB (ref 1.0–3.6)
Lymphocyte %: 11.9 %
MCH: 29 pg (ref 26.0–34.0)
MCHC: 31.9 g/dL — AB (ref 32.0–36.0)
MCV: 91 fL (ref 80–100)
Monocyte #: 0.1 x10 3/mm — ABNORMAL LOW (ref 0.2–0.9)
Monocyte %: 1.8 %
Neutrophil #: 4.6 10*3/uL (ref 1.4–6.5)
Neutrophil %: 86.2 %
Platelet: 108 10*3/uL — ABNORMAL LOW (ref 150–440)
RBC: 3.93 10*6/uL (ref 3.80–5.20)
RDW: 14.3 % (ref 11.5–14.5)
WBC: 5.3 10*3/uL (ref 3.6–11.0)

## 2014-09-16 LAB — URINE CULTURE

## 2014-09-17 LAB — INFLUENZA A,B,H1N1 - PCR (ARMC)
H1N1 flu by pcr: NOT DETECTED
INFLBPCR: NEGATIVE
Influenza A By PCR: NEGATIVE

## 2014-09-19 LAB — EXPECTORATED SPUTUM ASSESSMENT W REFEX TO RESP CULTURE

## 2014-09-20 LAB — CULTURE, BLOOD (SINGLE)

## 2014-09-22 LAB — CBC WITH DIFFERENTIAL/PLATELET
Basophil #: 0 10*3/uL (ref 0.0–0.1)
Basophil %: 0.1 %
EOS ABS: 0 10*3/uL (ref 0.0–0.7)
EOS PCT: 0 %
HCT: 35.9 % (ref 35.0–47.0)
HGB: 11.6 g/dL — ABNORMAL LOW (ref 12.0–16.0)
LYMPHS PCT: 7.9 %
Lymphocyte #: 0.6 10*3/uL — ABNORMAL LOW (ref 1.0–3.6)
MCH: 29 pg (ref 26.0–34.0)
MCHC: 32.2 g/dL (ref 32.0–36.0)
MCV: 90 fL (ref 80–100)
MONO ABS: 0.3 x10 3/mm (ref 0.2–0.9)
Monocyte %: 4.8 %
NEUTROS ABS: 6.2 10*3/uL (ref 1.4–6.5)
NEUTROS PCT: 87.2 %
Platelet: 159 10*3/uL (ref 150–440)
RBC: 3.99 10*6/uL (ref 3.80–5.20)
RDW: 14.3 % (ref 11.5–14.5)
WBC: 7.1 10*3/uL (ref 3.6–11.0)

## 2014-09-22 LAB — BASIC METABOLIC PANEL
Anion Gap: 8 (ref 7–16)
BUN: 31 mg/dL — ABNORMAL HIGH (ref 7–18)
CALCIUM: 8.5 mg/dL (ref 8.5–10.1)
CHLORIDE: 106 mmol/L (ref 98–107)
Co2: 27 mmol/L (ref 21–32)
Creatinine: 0.96 mg/dL (ref 0.60–1.30)
EGFR (Non-African Amer.): 59 — ABNORMAL LOW
Glucose: 148 mg/dL — ABNORMAL HIGH (ref 65–99)
Osmolality: 291 (ref 275–301)
Potassium: 4.1 mmol/L (ref 3.5–5.1)
Sodium: 141 mmol/L (ref 136–145)

## 2014-09-23 DIAGNOSIS — E119 Type 2 diabetes mellitus without complications: Secondary | ICD-10-CM | POA: Diagnosis not present

## 2014-09-23 DIAGNOSIS — M150 Primary generalized (osteo)arthritis: Secondary | ICD-10-CM | POA: Diagnosis not present

## 2014-09-23 DIAGNOSIS — I4891 Unspecified atrial fibrillation: Secondary | ICD-10-CM | POA: Diagnosis not present

## 2014-09-23 DIAGNOSIS — J449 Chronic obstructive pulmonary disease, unspecified: Secondary | ICD-10-CM | POA: Diagnosis not present

## 2014-09-23 DIAGNOSIS — I10 Essential (primary) hypertension: Secondary | ICD-10-CM | POA: Diagnosis not present

## 2014-09-23 DIAGNOSIS — I509 Heart failure, unspecified: Secondary | ICD-10-CM | POA: Diagnosis not present

## 2014-09-24 DIAGNOSIS — E119 Type 2 diabetes mellitus without complications: Secondary | ICD-10-CM | POA: Diagnosis not present

## 2014-09-24 DIAGNOSIS — I509 Heart failure, unspecified: Secondary | ICD-10-CM | POA: Diagnosis not present

## 2014-09-24 DIAGNOSIS — I4891 Unspecified atrial fibrillation: Secondary | ICD-10-CM | POA: Diagnosis not present

## 2014-09-24 DIAGNOSIS — I10 Essential (primary) hypertension: Secondary | ICD-10-CM | POA: Diagnosis not present

## 2014-09-24 DIAGNOSIS — M150 Primary generalized (osteo)arthritis: Secondary | ICD-10-CM | POA: Diagnosis not present

## 2014-09-24 DIAGNOSIS — J449 Chronic obstructive pulmonary disease, unspecified: Secondary | ICD-10-CM | POA: Diagnosis not present

## 2014-09-29 DIAGNOSIS — I10 Essential (primary) hypertension: Secondary | ICD-10-CM | POA: Diagnosis not present

## 2014-09-29 DIAGNOSIS — I748 Embolism and thrombosis of other arteries: Secondary | ICD-10-CM | POA: Diagnosis not present

## 2014-09-29 DIAGNOSIS — I482 Chronic atrial fibrillation: Secondary | ICD-10-CM | POA: Diagnosis not present

## 2014-09-29 DIAGNOSIS — I5032 Chronic diastolic (congestive) heart failure: Secondary | ICD-10-CM | POA: Diagnosis not present

## 2014-09-30 DIAGNOSIS — I10 Essential (primary) hypertension: Secondary | ICD-10-CM | POA: Diagnosis not present

## 2014-09-30 DIAGNOSIS — J449 Chronic obstructive pulmonary disease, unspecified: Secondary | ICD-10-CM | POA: Diagnosis not present

## 2014-09-30 DIAGNOSIS — M150 Primary generalized (osteo)arthritis: Secondary | ICD-10-CM | POA: Diagnosis not present

## 2014-09-30 DIAGNOSIS — I4891 Unspecified atrial fibrillation: Secondary | ICD-10-CM | POA: Diagnosis not present

## 2014-09-30 DIAGNOSIS — I509 Heart failure, unspecified: Secondary | ICD-10-CM | POA: Diagnosis not present

## 2014-09-30 DIAGNOSIS — E119 Type 2 diabetes mellitus without complications: Secondary | ICD-10-CM | POA: Diagnosis not present

## 2014-10-01 DIAGNOSIS — R54 Age-related physical debility: Secondary | ICD-10-CM | POA: Diagnosis not present

## 2014-10-01 DIAGNOSIS — I4891 Unspecified atrial fibrillation: Secondary | ICD-10-CM | POA: Diagnosis not present

## 2014-10-01 DIAGNOSIS — J441 Chronic obstructive pulmonary disease with (acute) exacerbation: Secondary | ICD-10-CM | POA: Diagnosis not present

## 2014-10-01 DIAGNOSIS — I509 Heart failure, unspecified: Secondary | ICD-10-CM | POA: Diagnosis not present

## 2014-10-01 DIAGNOSIS — I10 Essential (primary) hypertension: Secondary | ICD-10-CM | POA: Diagnosis not present

## 2014-10-01 DIAGNOSIS — E780 Pure hypercholesterolemia: Secondary | ICD-10-CM | POA: Diagnosis not present

## 2014-10-01 DIAGNOSIS — M150 Primary generalized (osteo)arthritis: Secondary | ICD-10-CM | POA: Diagnosis not present

## 2014-10-01 DIAGNOSIS — E119 Type 2 diabetes mellitus without complications: Secondary | ICD-10-CM | POA: Diagnosis not present

## 2014-10-01 DIAGNOSIS — J449 Chronic obstructive pulmonary disease, unspecified: Secondary | ICD-10-CM | POA: Diagnosis not present

## 2014-10-02 DIAGNOSIS — I509 Heart failure, unspecified: Secondary | ICD-10-CM | POA: Diagnosis not present

## 2014-10-02 DIAGNOSIS — E119 Type 2 diabetes mellitus without complications: Secondary | ICD-10-CM | POA: Diagnosis not present

## 2014-10-02 DIAGNOSIS — I10 Essential (primary) hypertension: Secondary | ICD-10-CM | POA: Diagnosis not present

## 2014-10-02 DIAGNOSIS — J449 Chronic obstructive pulmonary disease, unspecified: Secondary | ICD-10-CM | POA: Diagnosis not present

## 2014-10-02 DIAGNOSIS — M150 Primary generalized (osteo)arthritis: Secondary | ICD-10-CM | POA: Diagnosis not present

## 2014-10-02 DIAGNOSIS — I4891 Unspecified atrial fibrillation: Secondary | ICD-10-CM | POA: Diagnosis not present

## 2014-10-03 DIAGNOSIS — I509 Heart failure, unspecified: Secondary | ICD-10-CM | POA: Diagnosis not present

## 2014-10-03 DIAGNOSIS — I4891 Unspecified atrial fibrillation: Secondary | ICD-10-CM | POA: Diagnosis not present

## 2014-10-03 DIAGNOSIS — M150 Primary generalized (osteo)arthritis: Secondary | ICD-10-CM | POA: Diagnosis not present

## 2014-10-03 DIAGNOSIS — I10 Essential (primary) hypertension: Secondary | ICD-10-CM | POA: Diagnosis not present

## 2014-10-03 DIAGNOSIS — E119 Type 2 diabetes mellitus without complications: Secondary | ICD-10-CM | POA: Diagnosis not present

## 2014-10-03 DIAGNOSIS — J449 Chronic obstructive pulmonary disease, unspecified: Secondary | ICD-10-CM | POA: Diagnosis not present

## 2014-10-06 DIAGNOSIS — J449 Chronic obstructive pulmonary disease, unspecified: Secondary | ICD-10-CM | POA: Diagnosis not present

## 2014-10-06 DIAGNOSIS — I509 Heart failure, unspecified: Secondary | ICD-10-CM | POA: Diagnosis not present

## 2014-10-06 DIAGNOSIS — I4891 Unspecified atrial fibrillation: Secondary | ICD-10-CM | POA: Diagnosis not present

## 2014-10-06 DIAGNOSIS — I10 Essential (primary) hypertension: Secondary | ICD-10-CM | POA: Diagnosis not present

## 2014-10-06 DIAGNOSIS — M150 Primary generalized (osteo)arthritis: Secondary | ICD-10-CM | POA: Diagnosis not present

## 2014-10-06 DIAGNOSIS — E119 Type 2 diabetes mellitus without complications: Secondary | ICD-10-CM | POA: Diagnosis not present

## 2014-10-09 DIAGNOSIS — M150 Primary generalized (osteo)arthritis: Secondary | ICD-10-CM | POA: Diagnosis not present

## 2014-10-09 DIAGNOSIS — I4891 Unspecified atrial fibrillation: Secondary | ICD-10-CM | POA: Diagnosis not present

## 2014-10-09 DIAGNOSIS — I509 Heart failure, unspecified: Secondary | ICD-10-CM | POA: Diagnosis not present

## 2014-10-09 DIAGNOSIS — J449 Chronic obstructive pulmonary disease, unspecified: Secondary | ICD-10-CM | POA: Diagnosis not present

## 2014-10-09 DIAGNOSIS — E119 Type 2 diabetes mellitus without complications: Secondary | ICD-10-CM | POA: Diagnosis not present

## 2014-10-09 DIAGNOSIS — I10 Essential (primary) hypertension: Secondary | ICD-10-CM | POA: Diagnosis not present

## 2014-10-14 DIAGNOSIS — M150 Primary generalized (osteo)arthritis: Secondary | ICD-10-CM | POA: Diagnosis not present

## 2014-10-14 DIAGNOSIS — J449 Chronic obstructive pulmonary disease, unspecified: Secondary | ICD-10-CM | POA: Diagnosis not present

## 2014-10-14 DIAGNOSIS — I10 Essential (primary) hypertension: Secondary | ICD-10-CM | POA: Diagnosis not present

## 2014-10-14 DIAGNOSIS — I4891 Unspecified atrial fibrillation: Secondary | ICD-10-CM | POA: Diagnosis not present

## 2014-10-14 DIAGNOSIS — E119 Type 2 diabetes mellitus without complications: Secondary | ICD-10-CM | POA: Diagnosis not present

## 2014-10-14 DIAGNOSIS — I509 Heart failure, unspecified: Secondary | ICD-10-CM | POA: Diagnosis not present

## 2014-10-16 DIAGNOSIS — I10 Essential (primary) hypertension: Secondary | ICD-10-CM | POA: Diagnosis not present

## 2014-10-16 DIAGNOSIS — E119 Type 2 diabetes mellitus without complications: Secondary | ICD-10-CM | POA: Diagnosis not present

## 2014-10-16 DIAGNOSIS — I509 Heart failure, unspecified: Secondary | ICD-10-CM | POA: Diagnosis not present

## 2014-10-16 DIAGNOSIS — I4891 Unspecified atrial fibrillation: Secondary | ICD-10-CM | POA: Diagnosis not present

## 2014-10-16 DIAGNOSIS — M150 Primary generalized (osteo)arthritis: Secondary | ICD-10-CM | POA: Diagnosis not present

## 2014-10-16 DIAGNOSIS — J449 Chronic obstructive pulmonary disease, unspecified: Secondary | ICD-10-CM | POA: Diagnosis not present

## 2014-10-17 DIAGNOSIS — E119 Type 2 diabetes mellitus without complications: Secondary | ICD-10-CM | POA: Diagnosis not present

## 2014-10-17 DIAGNOSIS — I509 Heart failure, unspecified: Secondary | ICD-10-CM | POA: Diagnosis not present

## 2014-10-17 DIAGNOSIS — I10 Essential (primary) hypertension: Secondary | ICD-10-CM | POA: Diagnosis not present

## 2014-10-17 DIAGNOSIS — J449 Chronic obstructive pulmonary disease, unspecified: Secondary | ICD-10-CM | POA: Diagnosis not present

## 2014-10-17 DIAGNOSIS — I4891 Unspecified atrial fibrillation: Secondary | ICD-10-CM | POA: Diagnosis not present

## 2014-10-17 DIAGNOSIS — M150 Primary generalized (osteo)arthritis: Secondary | ICD-10-CM | POA: Diagnosis not present

## 2014-10-20 ENCOUNTER — Ambulatory Visit (INDEPENDENT_AMBULATORY_CARE_PROVIDER_SITE_OTHER): Payer: Medicare Other | Admitting: Internal Medicine

## 2014-10-20 ENCOUNTER — Encounter: Payer: Self-pay | Admitting: Internal Medicine

## 2014-10-20 VITALS — BP 150/90 | HR 52 | Temp 97.9°F | Ht 60.0 in | Wt 140.0 lb

## 2014-10-20 DIAGNOSIS — R0600 Dyspnea, unspecified: Secondary | ICD-10-CM | POA: Diagnosis not present

## 2014-10-20 DIAGNOSIS — J449 Chronic obstructive pulmonary disease, unspecified: Secondary | ICD-10-CM

## 2014-10-20 DIAGNOSIS — E119 Type 2 diabetes mellitus without complications: Secondary | ICD-10-CM | POA: Diagnosis not present

## 2014-10-20 DIAGNOSIS — I10 Essential (primary) hypertension: Secondary | ICD-10-CM | POA: Diagnosis not present

## 2014-10-20 DIAGNOSIS — A419 Sepsis, unspecified organism: Secondary | ICD-10-CM | POA: Diagnosis not present

## 2014-10-20 DIAGNOSIS — IMO0001 Reserved for inherently not codable concepts without codable children: Secondary | ICD-10-CM

## 2014-10-20 DIAGNOSIS — Q278 Other specified congenital malformations of peripheral vascular system: Secondary | ICD-10-CM | POA: Diagnosis not present

## 2014-10-20 NOTE — Assessment & Plan Note (Addendum)
Patient with 4 episodes of bronchitis in the past one year. I believe medication compliance and poor technique along with financial issues may be contributing to these recurrent episodes. She is a former smoker and quit 30+ years ago.  Plan: -Continue with Advair and Spiriva along with as needed albuterol. -Avoid sick contacts -Will plan for pulmonary function testing 6 minute walk test at next visit. -Will discuss pulmonary rehabilitation after PFTs. -check CBC with differential and IgE levels at next visit

## 2014-10-20 NOTE — Patient Instructions (Signed)
Please call our [facility] office mid February to schedule a appointment for March. We will call you to schedule a walk and breathing test before March appointment. We will also schedule a lab visit at [facility] Dr. Gabriel Carina 1 week before March appointment.

## 2014-10-20 NOTE — Progress Notes (Signed)
MRN# [id number] Tracy Richards 1929/01/31   CC: Chief Complaint  Patient presents with  . Hospitalization Follow-up    pt still has sl. cough, sob with exhertion, she denies chest tightness and wheezing.    Brief History: 81 yowf quit smoking around 1974 At wt around 89 with no respiratory problems until sinus problems starting in her 30's referred 03/18/2013 to pulmonary clinic by Cyndi Bender for refractory cough and wheeze despite rx with symbiocort and spiriva  03/18/2013 1st pulmonary cc nasal congestion assoc with cough indolent onset, persistent x 2 years present 24 h per day but does does not bring up any sign mucus, best with prednisone but "it doesn't last". Apparently not able to take muliple meds (see allergies). Assoc with subj wheeze and mild doe, does not respond to saba. rec Stop atenolol and prilosec and spiriva and symbicort  Only use the albuterol if you can't catch your breath. Bystolic 5 mg daily and if blood pressure is too high take it twice daily Pantoprazole (protonix) 40 mg Take 30-60 min before first meal of the day and Pepcid 20 mg one bedtime until return to office - this is the best way to tell whether stomach acid is contributing to your problem.  GERD (REFLUX) diet   04/01/2013 f/u [facility] did not bring pillbox but did bring most meds and seems to know them well Chief Complaint  Patient presents with  . Acute Visit    Chills yesterday with night sweats and weakness in legs. Cough has improved - prod at times with yellow mucus, some wheezing, and SOB unchanged. No chest tightness or chest pain.  breathing is no worse off all inhalers - does have saba but not using. No obvious daytime variabilty or assoc c cp or chest tightness, subjective wheeze overt sinus or hb symptoms. No unusual exp hx or h/o childhood pna/ asthma or knowledge of premature birth.  Also denies any obvious fluctuation of symptoms with weather or environmental changes or  other aggravating or alleviating factors except as outlined above    Events since last clinic visit: Patient presents today for hospital a hospital followup visit (see below). Patient is a pleasant 81 year old female presents today for a followup hospital visit. She is accompanied by her grandson, who she lives with. Today she states she has some mild shortness of breath, but this is progressively getting better and a mild nonproductive cough, which is also getting better. She was recently hospitalized for COPD and bronchitis (see below) and was discharged on antibiotics and steroids along with physical therapy at home. Patient states today she is able to help with the dishes and a few household chores with only a mild level of dyspnea. She admits that she's not quite back to baseline breathing but has been improving over the last 2-3 weeks.    Review of Chart DOA 09/15/14 Discharge date 09/21/14  81 year old female who has history of chronic atrial fibrillation, pneumonia, diastolic congestive heart failure, peripheral vascular disease with carotid bruit, osteoarthritis, chronic vertigo, type 2 diabetes, anxiety and depression, had recurrent respiratory issues in the last 6 months. She was admitted in August the last time over here and was sent to rehabilitation, and after that also had to visit the Emergency Room twice for similar respiratory issues. From rehabilitation, she was sent home and was doing good until 2 or 3 days ago when she started having some cough and shortness of breath and she had yellowish sputum secretions. So decided to come  to the Emergency Room today. She was also noted to have 102 degree fever in the Emergency Room and some hypoxia, so started on broad-spectrum antibiotic.   81 year old female with multiple medical problems with chronic atrial fibrillation and diastolic congestive heart failure, pneumonia in the past, diabetes, came to Emergency Room with some cough  and sputum production with some shortness of breath for the last few days and had fever, tachycardia and increased respiratory rate in Emergency Room and requiring oxygen supplementation.   Acute bronchitis - recurrent (4th episode this year) - continue with IV Rocephin and IV steroid, and PO azithromycin  - stop Spiriva in the inpatient setting, cont with albuterol nebs - sputum culture - results pending>>normal flora - flu panel - negative - currently with improvement compared to yesterday - consider switching to PO steroids in the next 1-2 days.  Give total of 12-14 day of steriods. - incentive spirometry - CT Chest with no inflitrates, but severe emphysematous changes and possible left subclavian artery occlusion.    COPD - management as stated above - resume spiriva and advair upon discharge. - follow up with [facility] 1 month after discharge  Pulmonary nodules - RLL and RML subcentimeter (68mm) - repeat CT in 1 year.     PMHX:   Past Medical History  Diagnosis Date  . Depression   . PVD (peripheral vascular disease)     blochage in Right leg -- had stent placement  . Hypertension   . Hyperlipidemia   . Gout   . Peripheral neuropathy   . Spinal stenosis   . GERD (gastroesophageal reflux disease)   . Diabetes   . COPD (chronic obstructive pulmonary disease)   . Hypothyroid   . Spinal stenosis   . Unspecified hereditary and idiopathic peripheral neuropathy   . Osteoporosis   . Renal artery stenosis   . Benign paroxysmal vertigo    Surgical Hx:  Past Surgical History  Procedure Laterality Date  . Tonsillectomy    . Appendectomy    . Abdominal hysterectomy    . Cataract extraction w/phaco    . Back surgery    . Vein surgery      VEIN STRIPPED  . Iliac artery stent    . Kidney surgery      ballooned multiple times   Family Hx:  Family History  Problem Relation Age of Onset  . Kidney disease Brother     Brights disease  . Bone cancer Brother   .  Hypertension Mother   . Sudden death Mother     unknown causes  . Sudden death Father     unknown causes  . Ovarian cancer Daughter    Social Hx:   History  Substance Use Topics  . Smoking status: Former Smoker -- 0.25 packs/day for 20 years    Types: Cigarettes    Quit date: 10/03/1978  . Smokeless tobacco: Never Used  . Alcohol Use: No   Medication:   Current Outpatient Rx  Name  Route  Sig  Dispense  Refill  . ADVAIR DISKUS 250-50 MCG/DOSE AEPB   Inhalation   Inhale 1 puff into the lungs 2 (two) times daily.      1     Dispense as written.   Marland Kitchen albuterol (PROVENTIL) (2.5 MG/3ML) 0.083% nebulizer solution   Nebulization   Take 2.5 mg by nebulization every 6 (six) hours as needed for wheezing or shortness of breath.         Marland Kitchen aspirin  81 MG tablet   Oral   Take 81 mg by mouth daily.         Marland Kitchen atorvastatin (LIPITOR) 40 MG tablet   Oral   Take 40 mg by mouth daily.         . calcium-vitamin D (OSCAL WITH D) 250-125 MG-UNIT per tablet   Oral   Take 2 tablets by mouth daily.         Marland Kitchen diltiazem (CARDIZEM CD) 120 MG 24 hr capsule   Oral   Take 120 mg by mouth daily.      4   . fluticasone (FLONASE) 50 MCG/ACT nasal spray   Nasal   Place 2 sprays into the nose daily.         Marland Kitchen gabapentin (NEURONTIN) 300 MG capsule   Oral   Take 600 mg by mouth 3 (three) times daily.         Marland Kitchen levothyroxine (SYNTHROID, LEVOTHROID) 100 MCG tablet   Oral   Take 100 mcg by mouth daily.      4   . meclizine (ANTIVERT) 25 MG tablet   Oral   Take 25 mg by mouth as needed.         . metoprolol tartrate (LOPRESSOR) 25 MG tablet   Oral   Take 25 mg by mouth 2 (two) times daily.      4   . multivitamin (THERAGRAN) per tablet   Oral   Take 1 tablet by mouth daily.         . pantoprazole (PROTONIX) 40 MG tablet      TAKE 1 TABLET BY MOUTH EVERY DAY . TAKE 30-60 MINUTES BEFORE MEAL OF THE DAY   30 tablet   11   . potassium chloride (K-DUR) 10 MEQ tablet    Oral   Take 10 mEq by mouth daily.         Marland Kitchen SPIRIVA HANDIHALER 18 MCG inhalation capsule   Inhalation   Place 18 mcg into inhaler and inhale daily.      1     Dispense as written.   . torsemide (DEMADEX) 20 MG tablet   Oral   Take 20 mg by mouth daily.         . famotidine (PEPCID) 20 MG tablet      TAKE 1 TABLET BY MOUTH EVERY DAY AT BEDTIME Patient not taking: Reported on 10/20/2014   30 tablet   11      Review of Systems: Gen:  Denies  fever, sweats, chills HEENT: Denies blurred vision, double vision, ear pain, eye pain, hearing loss, nose bleeds, sore throat Cvc:  No dizziness, chest pain or heaviness Resp:   Sob (chronic, slowly improving), mild nonproductive cough Gi: Denies swallowing difficulty, stomach pain, nausea or vomiting, diarrhea, constipation, bowel incontinence Gu:  Denies bladder incontinence, burning urine Ext:   No Joint pain, stiffness or swelling Skin: No skin rash, easy bruising or bleeding or hives Endoc:  No polyuria, polydipsia , polyphagia or weight change Psych: No depression, insomnia or hallucinations  Other:  All other systems negative  Allergies:  Amlodipine; Avelox; Carafate; Celecoxib; Diazepam; Doxazosin; Klonopin; Levofloxacin; Norvasc; Pantoprazole sodium; Ranitidine; Rofecoxib; Sulfa antibiotics; Sulfa drugs cross reactors; and Codeine  Physical Examination:  VS: BP 150/90 mmHg  Pulse 52  Temp(Src) 97.9 F (36.6 C) (Oral)  Ht 5' (1.524 m)  Wt 140 lb (63.504 kg)  BMI 27.34 kg/m2  SpO2 96%  General Appearance: No distress  Pulmonary:Exam: normal breath  sounds., mild decreased breath sounds at the bilateral bases, otherwise good inspiratory and expiratory effort. Cardiovascular:@ Exam:  Normal S1,S2.  No m/r/g.     Abdomen:Exam: Benign, Soft, non-tender, No masses  Skin:   warm, no rashes, no ecchymosis  Extremities: normal, no cyanosis, clubbing, no edema, warm with normal capillary refill.     Rad results: (The  following images and results were reviewed by Dr. Stevenson Clinch). CXR 09/21/14 FINDINGS: Bilateral mild interstitial thickening. No focal consolidation, pleural effusion or pneumothorax. Stable cardiomegaly. Thoracic aortic atherosclerosis. No acute osseous abnormality.   IMPRESSION: 1. Mild bilateral interstitial thickening likely reflecting an element of chronic interstitial disease with possible superimposed mild interstitial edema versus atypical infection.  CT Chest 09/18/14 FINDINGS: Extensive atherosclerotic calcifications aorta, great vessels, and coronary arteries. Occlusion versus subtotal occlusion of the proximal LEFT subclavian artery near origin. Mild enlargement of cardiac chambers, most prominently the atria. No thoracic adenopathy. Few mildly prominent small bowel loops in LEFT upper quadrant. Prominent stool in colon. Emphysematous changes greatest in upper lobes. Calcified granuloma RIGHT middle lobe image 30. 3 mm noncalcified nodules RIGHT middle lobe at image 28 and RIGHT lower lobe image 25. Remaining lungs clear. No infiltrate, pleural effusion or pneumothorax. No acute osseous findings.   IMPRESSION: Extensive atherosclerotic disease aorta, great vessels and coronary arteries. Question occlusion versus subtotal occlusion of proximal LEFT subclavian artery near origin ; recommend comparison of blood pressures between the LEFT upper and RIGHT upper extremities. Enlargement of cardiac silhouette. Emphysematous changes without acute infiltrate. Noncalcified RIGHT lung nodules 3 mm diameter in RIGHT middle lobe and RIGHT lower lobe.     Assessment and Plan: COPD bronchitis Patient with 4 episodes of bronchitis in the past one year. I believe medication compliance and poor technique along with financial issues may be contributing to these recurrent episodes. She is a former smoker and quit 30+ years ago.  Plan: -Continue with Advair and Spiriva along with  as needed albuterol. -Avoid sick contacts -Will plan for pulmonary function testing 6 minute walk test at next visit. -Will discuss pulmonary rehabilitation after PFTs. -check CBC with differential and IgE levels at next visit   Dyspnea Multifactorial: COPD, deconditioning, recurrent bronchitis.  Plan: -COPD optimization as stated -Pulmonary and referral after PFTs -Home for the spirometry -continue with physical therapy at home.     Updated Medication List Outpatient Encounter Prescriptions as of 10/20/2014  Medication Sig  . ADVAIR DISKUS 250-50 MCG/DOSE AEPB Inhale 1 puff into the lungs 2 (two) times daily.  Marland Kitchen albuterol (PROVENTIL) (2.5 MG/3ML) 0.083% nebulizer solution Take 2.5 mg by nebulization every 6 (six) hours as needed for wheezing or shortness of breath.  Marland Kitchen aspirin 81 MG tablet Take 81 mg by mouth daily.  Marland Kitchen atorvastatin (LIPITOR) 40 MG tablet Take 40 mg by mouth daily.  . calcium-vitamin D (OSCAL WITH D) 250-125 MG-UNIT per tablet Take 2 tablets by mouth daily.  Marland Kitchen diltiazem (CARDIZEM CD) 120 MG 24 hr capsule Take 120 mg by mouth daily.  . fluticasone (FLONASE) 50 MCG/ACT nasal spray Place 2 sprays into the nose daily.  Marland Kitchen gabapentin (NEURONTIN) 300 MG capsule Take 600 mg by mouth 3 (three) times daily.  Marland Kitchen levothyroxine (SYNTHROID, LEVOTHROID) 100 MCG tablet Take 100 mcg by mouth daily.  . meclizine (ANTIVERT) 25 MG tablet Take 25 mg by mouth as needed.  . metoprolol tartrate (LOPRESSOR) 25 MG tablet Take 25 mg by mouth 2 (two) times daily.  . multivitamin (THERAGRAN) per tablet Take 1 tablet  by mouth daily.  . pantoprazole (PROTONIX) 40 MG tablet TAKE 1 TABLET BY MOUTH EVERY DAY . TAKE 30-60 MINUTES BEFORE MEAL OF THE DAY  . potassium chloride (K-DUR) 10 MEQ tablet Take 10 mEq by mouth daily.  Marland Kitchen SPIRIVA HANDIHALER 18 MCG inhalation capsule Place 18 mcg into inhaler and inhale daily.  Marland Kitchen torsemide (DEMADEX) 20 MG tablet Take 20 mg by mouth daily.  . [DISCONTINUED]  ALPRAZolam (XANAX) 0.5 MG tablet Take 0.5 mg by mouth 2 (two) times daily as needed.  . famotidine (PEPCID) 20 MG tablet TAKE 1 TABLET BY MOUTH EVERY DAY AT BEDTIME (Patient not taking: Reported on 10/20/2014)  . [DISCONTINUED] albuterol (VENTOLIN HFA) 108 (90 BASE) MCG/ACT inhaler Inhale 2 puffs into the lungs every 6 (six) hours as needed for wheezing or shortness of breath.  . [DISCONTINUED] busPIRone (BUSPAR) 7.5 MG tablet Take 7.5 mg by mouth 2 (two) times daily.  . [DISCONTINUED] felodipine (PLENDIL) 10 MG 24 hr tablet Take 10 mg by mouth daily.  . [DISCONTINUED] fexofenadine (ALLEGRA) 180 MG tablet Take 180 mg by mouth daily.  . [DISCONTINUED] levothyroxine (SYNTHROID, LEVOTHROID) 112 MCG tablet Take 112 mcg by mouth daily before breakfast.  . [DISCONTINUED] losartan (COZAAR) 100 MG tablet Take 100 mg by mouth daily.  . [DISCONTINUED] nebivolol (BYSTOLIC) 10 MG tablet Take 1 tablet (10 mg total) by mouth daily. (Patient not taking: Reported on 10/20/2014)    Orders for this visit: Orders Placed This Encounter  Procedures  . Allergen food profile specific IgE    Pt has appt 12/03/14    Standing Status: Future     Number of Occurrences:      Standing Expiration Date: 10/21/2015  . CBC with Differential    Pt has appt 12/03/14    Standing Status: Future     Number of Occurrences:      Standing Expiration Date: 10/21/2015  . Pulmonary function test    Standing Status: Future     Number of Occurrences:      Standing Expiration Date: 10/21/2015    Scheduling Instructions:     Please schedule in [place] if possible 12/03/14. Pt has lab appointment that day.    Order Specific Question:  Where should this test be performed?    Answer:  [facility]    Order Specific Question:  Full PFT: includes the following: basic spirometry, spirometry pre & post bronchodilator, diffusion capacity (DLCO), lung volumes    Answer:  Full PFT    Order Specific Question:  MIP/MEP    Answer:  No    Order  Specific Question:  6 minute walk    Answer:  Yes    Order Specific Question:  ABG    Answer:  No    Order Specific Question:  Diffusion capacity (DLCO)    Answer:  No    Order Specific Question:  Lung volumes    Answer:  No    Order Specific Question:  Methacholine challenge    Answer:  No    Thank  you for the visitation and for allowing  [facility] to assist in the care of your patient. Our recommendations are noted above.  Please contact us if we can be of further service.  Vilinda Boehringer, MD [facility] Office Number: [phone]

## 2014-10-20 NOTE — Assessment & Plan Note (Signed)
Multifactorial: COPD, deconditioning, recurrent bronchitis.  Plan: -COPD optimization as stated -Pulmonary and referral after PFTs -Home for the spirometry -continue with physical therapy at home.

## 2014-10-21 ENCOUNTER — Ambulatory Visit: Payer: Medicare Other | Admitting: Internal Medicine

## 2014-10-21 DIAGNOSIS — M150 Primary generalized (osteo)arthritis: Secondary | ICD-10-CM | POA: Diagnosis not present

## 2014-10-21 DIAGNOSIS — J449 Chronic obstructive pulmonary disease, unspecified: Secondary | ICD-10-CM | POA: Diagnosis not present

## 2014-10-21 DIAGNOSIS — I509 Heart failure, unspecified: Secondary | ICD-10-CM | POA: Diagnosis not present

## 2014-10-21 DIAGNOSIS — E119 Type 2 diabetes mellitus without complications: Secondary | ICD-10-CM | POA: Diagnosis not present

## 2014-10-21 DIAGNOSIS — I10 Essential (primary) hypertension: Secondary | ICD-10-CM | POA: Diagnosis not present

## 2014-10-21 DIAGNOSIS — I4891 Unspecified atrial fibrillation: Secondary | ICD-10-CM | POA: Diagnosis not present

## 2014-10-22 DIAGNOSIS — I10 Essential (primary) hypertension: Secondary | ICD-10-CM | POA: Diagnosis not present

## 2014-10-22 DIAGNOSIS — E119 Type 2 diabetes mellitus without complications: Secondary | ICD-10-CM | POA: Diagnosis not present

## 2014-10-22 DIAGNOSIS — I509 Heart failure, unspecified: Secondary | ICD-10-CM | POA: Diagnosis not present

## 2014-10-22 DIAGNOSIS — I4891 Unspecified atrial fibrillation: Secondary | ICD-10-CM | POA: Diagnosis not present

## 2014-10-22 DIAGNOSIS — M150 Primary generalized (osteo)arthritis: Secondary | ICD-10-CM | POA: Diagnosis not present

## 2014-10-22 DIAGNOSIS — J449 Chronic obstructive pulmonary disease, unspecified: Secondary | ICD-10-CM | POA: Diagnosis not present

## 2014-10-30 DIAGNOSIS — I4891 Unspecified atrial fibrillation: Secondary | ICD-10-CM | POA: Diagnosis not present

## 2014-10-30 DIAGNOSIS — I10 Essential (primary) hypertension: Secondary | ICD-10-CM | POA: Diagnosis not present

## 2014-10-30 DIAGNOSIS — J449 Chronic obstructive pulmonary disease, unspecified: Secondary | ICD-10-CM | POA: Diagnosis not present

## 2014-10-30 DIAGNOSIS — M150 Primary generalized (osteo)arthritis: Secondary | ICD-10-CM | POA: Diagnosis not present

## 2014-10-30 DIAGNOSIS — E119 Type 2 diabetes mellitus without complications: Secondary | ICD-10-CM | POA: Diagnosis not present

## 2014-10-30 DIAGNOSIS — I509 Heart failure, unspecified: Secondary | ICD-10-CM | POA: Diagnosis not present

## 2014-10-31 DIAGNOSIS — I4891 Unspecified atrial fibrillation: Secondary | ICD-10-CM | POA: Diagnosis not present

## 2014-10-31 DIAGNOSIS — I10 Essential (primary) hypertension: Secondary | ICD-10-CM | POA: Diagnosis not present

## 2014-10-31 DIAGNOSIS — R609 Edema, unspecified: Secondary | ICD-10-CM | POA: Diagnosis not present

## 2014-10-31 DIAGNOSIS — I509 Heart failure, unspecified: Secondary | ICD-10-CM | POA: Diagnosis not present

## 2014-10-31 DIAGNOSIS — J449 Chronic obstructive pulmonary disease, unspecified: Secondary | ICD-10-CM | POA: Diagnosis not present

## 2014-10-31 DIAGNOSIS — M150 Primary generalized (osteo)arthritis: Secondary | ICD-10-CM | POA: Diagnosis not present

## 2014-10-31 DIAGNOSIS — B370 Candidal stomatitis: Secondary | ICD-10-CM | POA: Diagnosis not present

## 2014-10-31 DIAGNOSIS — E119 Type 2 diabetes mellitus without complications: Secondary | ICD-10-CM | POA: Diagnosis not present

## 2014-11-01 DIAGNOSIS — E119 Type 2 diabetes mellitus without complications: Secondary | ICD-10-CM | POA: Diagnosis not present

## 2014-11-01 DIAGNOSIS — I4891 Unspecified atrial fibrillation: Secondary | ICD-10-CM | POA: Diagnosis not present

## 2014-11-01 DIAGNOSIS — I10 Essential (primary) hypertension: Secondary | ICD-10-CM | POA: Diagnosis not present

## 2014-11-01 DIAGNOSIS — J449 Chronic obstructive pulmonary disease, unspecified: Secondary | ICD-10-CM | POA: Diagnosis not present

## 2014-11-01 DIAGNOSIS — I509 Heart failure, unspecified: Secondary | ICD-10-CM | POA: Diagnosis not present

## 2014-11-01 DIAGNOSIS — M150 Primary generalized (osteo)arthritis: Secondary | ICD-10-CM | POA: Diagnosis not present

## 2014-11-01 DIAGNOSIS — Z8701 Personal history of pneumonia (recurrent): Secondary | ICD-10-CM | POA: Diagnosis not present

## 2014-11-03 DIAGNOSIS — I10 Essential (primary) hypertension: Secondary | ICD-10-CM | POA: Diagnosis not present

## 2014-11-03 DIAGNOSIS — I748 Embolism and thrombosis of other arteries: Secondary | ICD-10-CM | POA: Diagnosis not present

## 2014-11-03 DIAGNOSIS — I701 Atherosclerosis of renal artery: Secondary | ICD-10-CM | POA: Diagnosis not present

## 2014-11-03 DIAGNOSIS — I482 Chronic atrial fibrillation: Secondary | ICD-10-CM | POA: Diagnosis not present

## 2014-11-05 DIAGNOSIS — M150 Primary generalized (osteo)arthritis: Secondary | ICD-10-CM | POA: Diagnosis not present

## 2014-11-05 DIAGNOSIS — E119 Type 2 diabetes mellitus without complications: Secondary | ICD-10-CM | POA: Diagnosis not present

## 2014-11-05 DIAGNOSIS — J449 Chronic obstructive pulmonary disease, unspecified: Secondary | ICD-10-CM | POA: Diagnosis not present

## 2014-11-05 DIAGNOSIS — I4891 Unspecified atrial fibrillation: Secondary | ICD-10-CM | POA: Diagnosis not present

## 2014-11-05 DIAGNOSIS — I10 Essential (primary) hypertension: Secondary | ICD-10-CM | POA: Diagnosis not present

## 2014-11-05 DIAGNOSIS — I509 Heart failure, unspecified: Secondary | ICD-10-CM | POA: Diagnosis not present

## 2014-11-06 DIAGNOSIS — I4891 Unspecified atrial fibrillation: Secondary | ICD-10-CM | POA: Diagnosis not present

## 2014-11-06 DIAGNOSIS — I509 Heart failure, unspecified: Secondary | ICD-10-CM | POA: Diagnosis not present

## 2014-11-06 DIAGNOSIS — E119 Type 2 diabetes mellitus without complications: Secondary | ICD-10-CM | POA: Diagnosis not present

## 2014-11-06 DIAGNOSIS — I10 Essential (primary) hypertension: Secondary | ICD-10-CM | POA: Diagnosis not present

## 2014-11-06 DIAGNOSIS — J449 Chronic obstructive pulmonary disease, unspecified: Secondary | ICD-10-CM | POA: Diagnosis not present

## 2014-11-06 DIAGNOSIS — M150 Primary generalized (osteo)arthritis: Secondary | ICD-10-CM | POA: Diagnosis not present

## 2014-11-07 DIAGNOSIS — I10 Essential (primary) hypertension: Secondary | ICD-10-CM | POA: Diagnosis not present

## 2014-11-07 DIAGNOSIS — J449 Chronic obstructive pulmonary disease, unspecified: Secondary | ICD-10-CM | POA: Diagnosis not present

## 2014-11-07 DIAGNOSIS — E119 Type 2 diabetes mellitus without complications: Secondary | ICD-10-CM | POA: Diagnosis not present

## 2014-11-07 DIAGNOSIS — I509 Heart failure, unspecified: Secondary | ICD-10-CM | POA: Diagnosis not present

## 2014-11-07 DIAGNOSIS — I4891 Unspecified atrial fibrillation: Secondary | ICD-10-CM | POA: Diagnosis not present

## 2014-11-07 DIAGNOSIS — M150 Primary generalized (osteo)arthritis: Secondary | ICD-10-CM | POA: Diagnosis not present

## 2014-11-11 DIAGNOSIS — M150 Primary generalized (osteo)arthritis: Secondary | ICD-10-CM | POA: Diagnosis not present

## 2014-11-11 DIAGNOSIS — I10 Essential (primary) hypertension: Secondary | ICD-10-CM | POA: Diagnosis not present

## 2014-11-11 DIAGNOSIS — E119 Type 2 diabetes mellitus without complications: Secondary | ICD-10-CM | POA: Diagnosis not present

## 2014-11-11 DIAGNOSIS — J449 Chronic obstructive pulmonary disease, unspecified: Secondary | ICD-10-CM | POA: Diagnosis not present

## 2014-11-11 DIAGNOSIS — I4891 Unspecified atrial fibrillation: Secondary | ICD-10-CM | POA: Diagnosis not present

## 2014-11-11 DIAGNOSIS — I509 Heart failure, unspecified: Secondary | ICD-10-CM | POA: Diagnosis not present

## 2014-11-13 DIAGNOSIS — E119 Type 2 diabetes mellitus without complications: Secondary | ICD-10-CM | POA: Diagnosis not present

## 2014-11-13 DIAGNOSIS — I10 Essential (primary) hypertension: Secondary | ICD-10-CM | POA: Diagnosis not present

## 2014-11-13 DIAGNOSIS — J449 Chronic obstructive pulmonary disease, unspecified: Secondary | ICD-10-CM | POA: Diagnosis not present

## 2014-11-13 DIAGNOSIS — I509 Heart failure, unspecified: Secondary | ICD-10-CM | POA: Diagnosis not present

## 2014-11-13 DIAGNOSIS — I4891 Unspecified atrial fibrillation: Secondary | ICD-10-CM | POA: Diagnosis not present

## 2014-11-13 DIAGNOSIS — M150 Primary generalized (osteo)arthritis: Secondary | ICD-10-CM | POA: Diagnosis not present

## 2014-11-14 DIAGNOSIS — J449 Chronic obstructive pulmonary disease, unspecified: Secondary | ICD-10-CM | POA: Diagnosis not present

## 2014-11-14 DIAGNOSIS — I4891 Unspecified atrial fibrillation: Secondary | ICD-10-CM | POA: Diagnosis not present

## 2014-11-14 DIAGNOSIS — M150 Primary generalized (osteo)arthritis: Secondary | ICD-10-CM | POA: Diagnosis not present

## 2014-11-14 DIAGNOSIS — I10 Essential (primary) hypertension: Secondary | ICD-10-CM | POA: Diagnosis not present

## 2014-11-14 DIAGNOSIS — I509 Heart failure, unspecified: Secondary | ICD-10-CM | POA: Diagnosis not present

## 2014-11-14 DIAGNOSIS — E119 Type 2 diabetes mellitus without complications: Secondary | ICD-10-CM | POA: Diagnosis not present

## 2014-11-18 DIAGNOSIS — M150 Primary generalized (osteo)arthritis: Secondary | ICD-10-CM | POA: Diagnosis not present

## 2014-11-18 DIAGNOSIS — I10 Essential (primary) hypertension: Secondary | ICD-10-CM | POA: Diagnosis not present

## 2014-11-18 DIAGNOSIS — I509 Heart failure, unspecified: Secondary | ICD-10-CM | POA: Diagnosis not present

## 2014-11-18 DIAGNOSIS — J449 Chronic obstructive pulmonary disease, unspecified: Secondary | ICD-10-CM | POA: Diagnosis not present

## 2014-11-18 DIAGNOSIS — E119 Type 2 diabetes mellitus without complications: Secondary | ICD-10-CM | POA: Diagnosis not present

## 2014-11-18 DIAGNOSIS — I4891 Unspecified atrial fibrillation: Secondary | ICD-10-CM | POA: Diagnosis not present

## 2014-11-20 DIAGNOSIS — J449 Chronic obstructive pulmonary disease, unspecified: Secondary | ICD-10-CM | POA: Diagnosis not present

## 2014-11-20 DIAGNOSIS — I4891 Unspecified atrial fibrillation: Secondary | ICD-10-CM | POA: Diagnosis not present

## 2014-11-20 DIAGNOSIS — I10 Essential (primary) hypertension: Secondary | ICD-10-CM | POA: Diagnosis not present

## 2014-11-20 DIAGNOSIS — M150 Primary generalized (osteo)arthritis: Secondary | ICD-10-CM | POA: Diagnosis not present

## 2014-11-20 DIAGNOSIS — I509 Heart failure, unspecified: Secondary | ICD-10-CM | POA: Diagnosis not present

## 2014-11-20 DIAGNOSIS — E119 Type 2 diabetes mellitus without complications: Secondary | ICD-10-CM | POA: Diagnosis not present

## 2014-11-21 DIAGNOSIS — J449 Chronic obstructive pulmonary disease, unspecified: Secondary | ICD-10-CM | POA: Diagnosis not present

## 2014-11-21 DIAGNOSIS — I10 Essential (primary) hypertension: Secondary | ICD-10-CM | POA: Diagnosis not present

## 2014-11-21 DIAGNOSIS — M150 Primary generalized (osteo)arthritis: Secondary | ICD-10-CM | POA: Diagnosis not present

## 2014-11-21 DIAGNOSIS — I509 Heart failure, unspecified: Secondary | ICD-10-CM | POA: Diagnosis not present

## 2014-11-21 DIAGNOSIS — I4891 Unspecified atrial fibrillation: Secondary | ICD-10-CM | POA: Diagnosis not present

## 2014-11-21 DIAGNOSIS — E119 Type 2 diabetes mellitus without complications: Secondary | ICD-10-CM | POA: Diagnosis not present

## 2014-11-24 DIAGNOSIS — E119 Type 2 diabetes mellitus without complications: Secondary | ICD-10-CM | POA: Diagnosis not present

## 2014-11-24 DIAGNOSIS — I10 Essential (primary) hypertension: Secondary | ICD-10-CM | POA: Diagnosis not present

## 2014-11-24 DIAGNOSIS — J449 Chronic obstructive pulmonary disease, unspecified: Secondary | ICD-10-CM | POA: Diagnosis not present

## 2014-11-24 DIAGNOSIS — M150 Primary generalized (osteo)arthritis: Secondary | ICD-10-CM | POA: Diagnosis not present

## 2014-11-24 DIAGNOSIS — I509 Heart failure, unspecified: Secondary | ICD-10-CM | POA: Diagnosis not present

## 2014-11-24 DIAGNOSIS — I4891 Unspecified atrial fibrillation: Secondary | ICD-10-CM | POA: Diagnosis not present

## 2014-11-26 DIAGNOSIS — M150 Primary generalized (osteo)arthritis: Secondary | ICD-10-CM | POA: Diagnosis not present

## 2014-11-26 DIAGNOSIS — I10 Essential (primary) hypertension: Secondary | ICD-10-CM | POA: Diagnosis not present

## 2014-11-26 DIAGNOSIS — I4891 Unspecified atrial fibrillation: Secondary | ICD-10-CM | POA: Diagnosis not present

## 2014-11-26 DIAGNOSIS — J449 Chronic obstructive pulmonary disease, unspecified: Secondary | ICD-10-CM | POA: Diagnosis not present

## 2014-11-26 DIAGNOSIS — I509 Heart failure, unspecified: Secondary | ICD-10-CM | POA: Diagnosis not present

## 2014-11-26 DIAGNOSIS — E119 Type 2 diabetes mellitus without complications: Secondary | ICD-10-CM | POA: Diagnosis not present

## 2014-12-02 DIAGNOSIS — M150 Primary generalized (osteo)arthritis: Secondary | ICD-10-CM | POA: Diagnosis not present

## 2014-12-02 DIAGNOSIS — E119 Type 2 diabetes mellitus without complications: Secondary | ICD-10-CM | POA: Diagnosis not present

## 2014-12-02 DIAGNOSIS — I10 Essential (primary) hypertension: Secondary | ICD-10-CM | POA: Diagnosis not present

## 2014-12-02 DIAGNOSIS — I4891 Unspecified atrial fibrillation: Secondary | ICD-10-CM | POA: Diagnosis not present

## 2014-12-02 DIAGNOSIS — I509 Heart failure, unspecified: Secondary | ICD-10-CM | POA: Diagnosis not present

## 2014-12-02 DIAGNOSIS — J449 Chronic obstructive pulmonary disease, unspecified: Secondary | ICD-10-CM | POA: Diagnosis not present

## 2014-12-03 ENCOUNTER — Other Ambulatory Visit: Payer: Medicare Other

## 2014-12-03 DIAGNOSIS — I10 Essential (primary) hypertension: Secondary | ICD-10-CM | POA: Diagnosis not present

## 2014-12-03 DIAGNOSIS — E119 Type 2 diabetes mellitus without complications: Secondary | ICD-10-CM | POA: Diagnosis not present

## 2014-12-03 DIAGNOSIS — J449 Chronic obstructive pulmonary disease, unspecified: Secondary | ICD-10-CM | POA: Diagnosis not present

## 2014-12-03 DIAGNOSIS — Z23 Encounter for immunization: Secondary | ICD-10-CM | POA: Diagnosis not present

## 2014-12-03 DIAGNOSIS — I739 Peripheral vascular disease, unspecified: Secondary | ICD-10-CM | POA: Diagnosis not present

## 2014-12-03 DIAGNOSIS — R609 Edema, unspecified: Secondary | ICD-10-CM | POA: Diagnosis not present

## 2014-12-03 DIAGNOSIS — E780 Pure hypercholesterolemia: Secondary | ICD-10-CM | POA: Diagnosis not present

## 2014-12-03 DIAGNOSIS — I4891 Unspecified atrial fibrillation: Secondary | ICD-10-CM | POA: Diagnosis not present

## 2014-12-03 DIAGNOSIS — Z79899 Other long term (current) drug therapy: Secondary | ICD-10-CM | POA: Diagnosis not present

## 2014-12-08 DIAGNOSIS — M150 Primary generalized (osteo)arthritis: Secondary | ICD-10-CM | POA: Diagnosis not present

## 2014-12-08 DIAGNOSIS — I10 Essential (primary) hypertension: Secondary | ICD-10-CM | POA: Diagnosis not present

## 2014-12-08 DIAGNOSIS — I509 Heart failure, unspecified: Secondary | ICD-10-CM | POA: Diagnosis not present

## 2014-12-08 DIAGNOSIS — I4891 Unspecified atrial fibrillation: Secondary | ICD-10-CM | POA: Diagnosis not present

## 2014-12-08 DIAGNOSIS — E119 Type 2 diabetes mellitus without complications: Secondary | ICD-10-CM | POA: Diagnosis not present

## 2014-12-08 DIAGNOSIS — J449 Chronic obstructive pulmonary disease, unspecified: Secondary | ICD-10-CM | POA: Diagnosis not present

## 2014-12-09 DIAGNOSIS — M791 Myalgia: Secondary | ICD-10-CM | POA: Diagnosis not present

## 2014-12-18 DIAGNOSIS — M150 Primary generalized (osteo)arthritis: Secondary | ICD-10-CM | POA: Diagnosis not present

## 2014-12-18 DIAGNOSIS — J449 Chronic obstructive pulmonary disease, unspecified: Secondary | ICD-10-CM | POA: Diagnosis not present

## 2014-12-18 DIAGNOSIS — I10 Essential (primary) hypertension: Secondary | ICD-10-CM | POA: Diagnosis not present

## 2014-12-18 DIAGNOSIS — I509 Heart failure, unspecified: Secondary | ICD-10-CM | POA: Diagnosis not present

## 2014-12-18 DIAGNOSIS — E119 Type 2 diabetes mellitus without complications: Secondary | ICD-10-CM | POA: Diagnosis not present

## 2014-12-18 DIAGNOSIS — I4891 Unspecified atrial fibrillation: Secondary | ICD-10-CM | POA: Diagnosis not present

## 2014-12-22 DIAGNOSIS — I509 Heart failure, unspecified: Secondary | ICD-10-CM | POA: Diagnosis not present

## 2014-12-22 DIAGNOSIS — I4891 Unspecified atrial fibrillation: Secondary | ICD-10-CM | POA: Diagnosis not present

## 2014-12-22 DIAGNOSIS — J449 Chronic obstructive pulmonary disease, unspecified: Secondary | ICD-10-CM | POA: Diagnosis not present

## 2014-12-22 DIAGNOSIS — I10 Essential (primary) hypertension: Secondary | ICD-10-CM | POA: Diagnosis not present

## 2014-12-22 DIAGNOSIS — E119 Type 2 diabetes mellitus without complications: Secondary | ICD-10-CM | POA: Diagnosis not present

## 2014-12-22 DIAGNOSIS — M150 Primary generalized (osteo)arthritis: Secondary | ICD-10-CM | POA: Diagnosis not present

## 2014-12-30 DIAGNOSIS — I4891 Unspecified atrial fibrillation: Secondary | ICD-10-CM | POA: Diagnosis not present

## 2014-12-30 DIAGNOSIS — I10 Essential (primary) hypertension: Secondary | ICD-10-CM | POA: Diagnosis not present

## 2014-12-30 DIAGNOSIS — J449 Chronic obstructive pulmonary disease, unspecified: Secondary | ICD-10-CM | POA: Diagnosis not present

## 2014-12-30 DIAGNOSIS — M150 Primary generalized (osteo)arthritis: Secondary | ICD-10-CM | POA: Diagnosis not present

## 2014-12-30 DIAGNOSIS — E119 Type 2 diabetes mellitus without complications: Secondary | ICD-10-CM | POA: Diagnosis not present

## 2014-12-30 DIAGNOSIS — I509 Heart failure, unspecified: Secondary | ICD-10-CM | POA: Diagnosis not present

## 2015-01-01 DIAGNOSIS — Z6825 Body mass index (BMI) 25.0-25.9, adult: Secondary | ICD-10-CM | POA: Diagnosis not present

## 2015-01-01 DIAGNOSIS — J189 Pneumonia, unspecified organism: Secondary | ICD-10-CM | POA: Diagnosis not present

## 2015-01-05 ENCOUNTER — Emergency Department
Admit: 2015-01-05 | Disposition: A | Discharge status: Home / Self Care | Payer: Self-pay | Admitting: Emergency Medicine

## 2015-01-05 DIAGNOSIS — R69 Illness, unspecified: Secondary | ICD-10-CM | POA: Diagnosis not present

## 2015-01-05 DIAGNOSIS — R05 Cough: Secondary | ICD-10-CM | POA: Diagnosis not present

## 2015-01-05 DIAGNOSIS — R0602 Shortness of breath: Secondary | ICD-10-CM | POA: Diagnosis not present

## 2015-01-05 DIAGNOSIS — Z791 Long term (current) use of non-steroidal anti-inflammatories (NSAID): Secondary | ICD-10-CM | POA: Diagnosis not present

## 2015-01-05 DIAGNOSIS — Z7952 Long term (current) use of systemic steroids: Secondary | ICD-10-CM | POA: Diagnosis not present

## 2015-01-05 DIAGNOSIS — Z79899 Other long term (current) drug therapy: Secondary | ICD-10-CM | POA: Diagnosis not present

## 2015-01-05 DIAGNOSIS — J441 Chronic obstructive pulmonary disease with (acute) exacerbation: Secondary | ICD-10-CM | POA: Diagnosis not present

## 2015-01-05 DIAGNOSIS — Z7982 Long term (current) use of aspirin: Secondary | ICD-10-CM | POA: Diagnosis not present

## 2015-01-05 DIAGNOSIS — J189 Pneumonia, unspecified organism: Secondary | ICD-10-CM | POA: Diagnosis not present

## 2015-01-05 DIAGNOSIS — R000 Tachycardia, unspecified: Secondary | ICD-10-CM | POA: Diagnosis not present

## 2015-01-05 DIAGNOSIS — I10 Essential (primary) hypertension: Secondary | ICD-10-CM | POA: Diagnosis not present

## 2015-01-05 DIAGNOSIS — Z7951 Long term (current) use of inhaled steroids: Secondary | ICD-10-CM | POA: Diagnosis not present

## 2015-01-05 LAB — COMPREHENSIVE METABOLIC PANEL
ALBUMIN: 4.3 g/dL
ANION GAP: 12 (ref 7–16)
AST: 27 U/L
Alkaline Phosphatase: 73 U/L
BUN: 22 mg/dL — AB
Bilirubin,Total: 0.5 mg/dL
Calcium, Total: 9 mg/dL
Chloride: 101 mmol/L
Co2: 26 mmol/L
Creatinine: 1.04 mg/dL — ABNORMAL HIGH
EGFR (African American): 58 — ABNORMAL LOW
GFR CALC NON AF AMER: 50 — AB
Glucose: 225 mg/dL — ABNORMAL HIGH
POTASSIUM: 3.5 mmol/L
SGPT (ALT): 22 U/L
SODIUM: 139 mmol/L
TOTAL PROTEIN: 7.3 g/dL

## 2015-01-05 LAB — CBC
HCT: 39.1 % (ref 35.0–47.0)
HGB: 12.8 g/dL (ref 12.0–16.0)
MCH: 29.9 pg (ref 26.0–34.0)
MCHC: 32.7 g/dL (ref 32.0–36.0)
MCV: 91 fL (ref 80–100)
PLATELETS: 286 10*3/uL (ref 150–440)
RBC: 4.28 10*6/uL (ref 3.80–5.20)
RDW: 14.2 % (ref 11.5–14.5)
WBC: 8.7 10*3/uL (ref 3.6–11.0)

## 2015-01-05 LAB — PROTIME-INR
INR: 1.1
Prothrombin Time: 14 secs

## 2015-01-05 LAB — PRO B NATRIURETIC PEPTIDE: B-Type Natriuretic Peptide: 648 pg/mL — ABNORMAL HIGH

## 2015-01-05 LAB — TROPONIN I: Troponin-I: <0.03 ng/mL

## 2015-01-05 LAB — APTT: ACTIVATED PTT: 23.5 s — AB (ref 23.6–35.9)

## 2015-01-15 DIAGNOSIS — N390 Urinary tract infection, site not specified: Secondary | ICD-10-CM | POA: Diagnosis not present

## 2015-01-19 ENCOUNTER — Inpatient Hospital Stay
Admit: 2015-01-19 | Disposition: A | Discharge status: Skilled Nursing Facility | Payer: Self-pay | Attending: Internal Medicine | Admitting: Internal Medicine

## 2015-01-19 DIAGNOSIS — J189 Pneumonia, unspecified organism: Secondary | ICD-10-CM | POA: Diagnosis not present

## 2015-01-19 DIAGNOSIS — E785 Hyperlipidemia, unspecified: Secondary | ICD-10-CM | POA: Diagnosis present

## 2015-01-19 DIAGNOSIS — R0689 Other abnormalities of breathing: Secondary | ICD-10-CM | POA: Diagnosis not present

## 2015-01-19 DIAGNOSIS — M810 Age-related osteoporosis without current pathological fracture: Secondary | ICD-10-CM | POA: Diagnosis present

## 2015-01-19 DIAGNOSIS — R0902 Hypoxemia: Secondary | ICD-10-CM | POA: Diagnosis not present

## 2015-01-19 DIAGNOSIS — Z7982 Long term (current) use of aspirin: Secondary | ICD-10-CM | POA: Diagnosis not present

## 2015-01-19 DIAGNOSIS — J449 Chronic obstructive pulmonary disease, unspecified: Secondary | ICD-10-CM | POA: Diagnosis not present

## 2015-01-19 DIAGNOSIS — E119 Type 2 diabetes mellitus without complications: Secondary | ICD-10-CM | POA: Diagnosis not present

## 2015-01-19 DIAGNOSIS — R0989 Other specified symptoms and signs involving the circulatory and respiratory systems: Secondary | ICD-10-CM | POA: Diagnosis not present

## 2015-01-19 DIAGNOSIS — R531 Weakness: Secondary | ICD-10-CM | POA: Diagnosis not present

## 2015-01-19 DIAGNOSIS — I482 Chronic atrial fibrillation: Secondary | ICD-10-CM | POA: Diagnosis not present

## 2015-01-19 DIAGNOSIS — D649 Anemia, unspecified: Secondary | ICD-10-CM | POA: Diagnosis not present

## 2015-01-19 DIAGNOSIS — F329 Major depressive disorder, single episode, unspecified: Secondary | ICD-10-CM | POA: Diagnosis present

## 2015-01-19 DIAGNOSIS — R0602 Shortness of breath: Secondary | ICD-10-CM | POA: Diagnosis not present

## 2015-01-19 DIAGNOSIS — I509 Heart failure, unspecified: Secondary | ICD-10-CM | POA: Diagnosis not present

## 2015-01-19 DIAGNOSIS — R339 Retention of urine, unspecified: Secondary | ICD-10-CM | POA: Diagnosis not present

## 2015-01-19 DIAGNOSIS — I4891 Unspecified atrial fibrillation: Secondary | ICD-10-CM | POA: Diagnosis not present

## 2015-01-19 DIAGNOSIS — Z9181 History of falling: Secondary | ICD-10-CM | POA: Diagnosis not present

## 2015-01-19 DIAGNOSIS — M6281 Muscle weakness (generalized): Secondary | ICD-10-CM | POA: Diagnosis not present

## 2015-01-19 DIAGNOSIS — I5032 Chronic diastolic (congestive) heart failure: Secondary | ICD-10-CM | POA: Diagnosis present

## 2015-01-19 DIAGNOSIS — M1990 Unspecified osteoarthritis, unspecified site: Secondary | ICD-10-CM | POA: Diagnosis present

## 2015-01-19 DIAGNOSIS — F419 Anxiety disorder, unspecified: Secondary | ICD-10-CM | POA: Diagnosis present

## 2015-01-19 DIAGNOSIS — Z8249 Family history of ischemic heart disease and other diseases of the circulatory system: Secondary | ICD-10-CM | POA: Diagnosis not present

## 2015-01-19 DIAGNOSIS — E039 Hypothyroidism, unspecified: Secondary | ICD-10-CM | POA: Diagnosis present

## 2015-01-19 DIAGNOSIS — I10 Essential (primary) hypertension: Secondary | ICD-10-CM | POA: Diagnosis not present

## 2015-01-19 LAB — COMPREHENSIVE METABOLIC PANEL
Albumin: 3.1 g/dL — ABNORMAL LOW
Alkaline Phosphatase: 75 U/L
Anion Gap: 10 (ref 7–16)
BUN: 11 mg/dL
Bilirubin,Total: 0.8 mg/dL
CHLORIDE: 104 mmol/L
CO2: 24 mmol/L
CREATININE: 0.79 mg/dL
Calcium, Total: 8.2 mg/dL — ABNORMAL LOW
EGFR (African American): >60
EGFR (Non-African Amer.): >60
Glucose: 136 mg/dL — ABNORMAL HIGH
POTASSIUM: 3.7 mmol/L
SGOT(AST): 20 U/L
SGPT (ALT): 22 U/L
Sodium: 138 mmol/L
TOTAL PROTEIN: 6.3 g/dL — AB

## 2015-01-19 LAB — CBC
HCT: 33.8 % — ABNORMAL LOW (ref 35.0–47.0)
HGB: 11.2 g/dL — AB (ref 12.0–16.0)
MCH: 29.8 pg (ref 26.0–34.0)
MCHC: 33.1 g/dL (ref 32.0–36.0)
MCV: 90 fL (ref 80–100)
Platelet: 170 10*3/uL (ref 150–440)
RBC: 3.76 10*6/uL — ABNORMAL LOW (ref 3.80–5.20)
RDW: 13.5 % (ref 11.5–14.5)
WBC: 9.5 10*3/uL (ref 3.6–11.0)

## 2015-01-19 LAB — URINALYSIS, COMPLETE
BACTERIA: NONE SEEN
Bilirubin,UR: NEGATIVE
Blood: NEGATIVE
Glucose,UR: 50 mg/dL (ref 0–75)
KETONE: NEGATIVE
LEUKOCYTE ESTERASE: NEGATIVE
Nitrite: NEGATIVE
Ph: 7 (ref 4.5–8.0)
Protein: 30
Specific Gravity: 1.009 (ref 1.003–1.030)

## 2015-01-19 LAB — TROPONIN I: Troponin-I: 0.03 ng/mL

## 2015-01-20 LAB — BASIC METABOLIC PANEL
Anion Gap: 7 (ref 7–16)
BUN: 9 mg/dL
CALCIUM: 7.9 mg/dL — AB
CHLORIDE: 103 mmol/L
Co2: 23 mmol/L
Creatinine: 0.71 mg/dL
EGFR (African American): >60
EGFR (Non-African Amer.): >60
Glucose: 106 mg/dL — ABNORMAL HIGH
Potassium: 3.7 mmol/L
Sodium: 133 mmol/L — ABNORMAL LOW

## 2015-01-20 LAB — CBC WITH DIFFERENTIAL/PLATELET
Basophil #: 0 10*3/uL (ref 0.0–0.1)
Basophil %: 0.3 %
Eosinophil #: 0.4 10*3/uL (ref 0.0–0.7)
Eosinophil %: 4.4 %
HCT: 29.9 % — ABNORMAL LOW (ref 35.0–47.0)
HGB: 10.1 g/dL — ABNORMAL LOW (ref 12.0–16.0)
LYMPHS ABS: 1 10*3/uL (ref 1.0–3.6)
Lymphocyte %: 10.8 %
MCH: 30.2 pg (ref 26.0–34.0)
MCHC: 33.7 g/dL (ref 32.0–36.0)
MCV: 90 fL (ref 80–100)
MONO ABS: 0.7 x10 3/mm (ref 0.2–0.9)
Monocyte %: 8.1 %
Neutrophil #: 6.8 10*3/uL — ABNORMAL HIGH (ref 1.4–6.5)
Neutrophil %: 76.4 %
Platelet: 143 10*3/uL — ABNORMAL LOW (ref 150–440)
RBC: 3.34 10*6/uL — AB (ref 3.80–5.20)
RDW: 13.9 % (ref 11.5–14.5)
WBC: 8.9 10*3/uL (ref 3.6–11.0)

## 2015-01-20 LAB — MAGNESIUM: Magnesium: 1.9 mg/dL

## 2015-01-22 DIAGNOSIS — F3340 Major depressive disorder, recurrent, in remission, unspecified: Secondary | ICD-10-CM | POA: Diagnosis not present

## 2015-01-22 DIAGNOSIS — E119 Type 2 diabetes mellitus without complications: Secondary | ICD-10-CM | POA: Diagnosis not present

## 2015-01-22 DIAGNOSIS — M6281 Muscle weakness (generalized): Secondary | ICD-10-CM | POA: Diagnosis not present

## 2015-01-22 DIAGNOSIS — Z9181 History of falling: Secondary | ICD-10-CM | POA: Diagnosis not present

## 2015-01-22 DIAGNOSIS — R0902 Hypoxemia: Secondary | ICD-10-CM | POA: Diagnosis not present

## 2015-01-22 DIAGNOSIS — J189 Pneumonia, unspecified organism: Secondary | ICD-10-CM | POA: Diagnosis not present

## 2015-01-22 DIAGNOSIS — I509 Heart failure, unspecified: Secondary | ICD-10-CM | POA: Diagnosis not present

## 2015-01-22 DIAGNOSIS — I5032 Chronic diastolic (congestive) heart failure: Secondary | ICD-10-CM | POA: Diagnosis not present

## 2015-01-22 DIAGNOSIS — E871 Hypo-osmolality and hyponatremia: Secondary | ICD-10-CM | POA: Diagnosis not present

## 2015-01-22 DIAGNOSIS — Z9981 Dependence on supplemental oxygen: Secondary | ICD-10-CM | POA: Diagnosis not present

## 2015-01-22 DIAGNOSIS — J449 Chronic obstructive pulmonary disease, unspecified: Secondary | ICD-10-CM | POA: Diagnosis not present

## 2015-01-22 DIAGNOSIS — I4891 Unspecified atrial fibrillation: Secondary | ICD-10-CM | POA: Diagnosis not present

## 2015-01-22 DIAGNOSIS — I482 Chronic atrial fibrillation: Secondary | ICD-10-CM | POA: Diagnosis not present

## 2015-01-22 DIAGNOSIS — I739 Peripheral vascular disease, unspecified: Secondary | ICD-10-CM | POA: Diagnosis not present

## 2015-01-24 LAB — CULTURE, BLOOD (SINGLE)

## 2015-01-24 NOTE — Consult Note (Signed)
General Aspect occlusion of the left subclavian artery   Present Illness the patient is an 81 year old female who has history of chronic atrial fibrillation, pneumonia, diastolic congestive heart failure, peripheral vascular disease with carotid bruit, osteoarthritis, chronic vertigo, type 2 diabetes, anxiety and depression.  She has had recurrent respiratory issues in the last 6 months. She was doing well until 2 or 3 days prior to admission when she started having some cough and shortness of breath and she had yellowish sputum secretions. She presented to the Emergency Room and was also noted to have 102 degree fever with hypoxia.  This prompted admission with initiation of broad-spectrum antibiotics  CT scan of the chest showed occlusion of the left subclavina artery which prompted vascular consultation.  She denies left arm pain or claudication symptoms.  She denies ulcers of the left hand.  She is right handed.  She also reports she has has a stent  placed in teh left subclavian artery in the past.   PAST MEDICAL HISTORY:  1.  Chronic atrial fibrillation.  2.  Chronic diastolic congestive heart failure.  3.  Peripheral vascular disease with carotid bruit.  4.  Osteoarthritis.   5.  Chronic vertigo. 6.  History of pneumonia.  7.  Type 2 diabetes. 8.  Osteoporosis.  9.  Renal artery stenosis.  10.  Anxiety and depression.  11.  Benign hypertension.  12.  Hyperlipidemia.  13.  Hypothyroidism.  14.  Peripheral neuropathy.  15.  Spinal stenosis   Home Medications: Medication Instructions Status  Metoprolol Tartrate 25 mg oral tablet 1 tab(s) orally 2 times a day Active  torsemide 20 mg oral tablet 2 tab(s) orally once a day Active  atorvastatin 40 mg oral tablet 1 tab(s) orally once a day Active  Advair Diskus 250 mcg-50 mcg inhalation powder 1 puff(s) inhaled 2 times a day Active  Spiriva 18 mcg inhalation capsule 1 cap(s) inhaled once a day Active  albuterol 2.5 mg/3 mL (0.083%)  inhalation solution 3 milliliter(s) inhaled every 6 hours, As Needed - for Wheezing, for Shortness of Breath  Active  ALPRAZolam 0.5 mg oral tablet 0.5 tab(s) orally once a day (at bedtime), As Needed - for Inability to Sleep Active  aspirin 81 mg oral tablet 1 tab(s) orally once a day Active  diltiazem 120 mg/24 hours oral capsule, extended release 1 cap(s) orally once a day Active  fluticasone nasal 50 mcg/inh nasal spray 2 spray(s) nasal once a day, As Needed for allergies.  Active  gabapentin 600 mg oral tablet 1 tab(s) orally 3 times a day Active  levothyroxine 100 mcg (0.1 mg) oral tablet 1 tab(s) orally once a day Active  meclizine 25 mg oral tablet 1 tab(s) orally 3 times a day, As Needed for dizziness  Active  pantoprazole 40 mg oral delayed release tablet 1 tab(s) orally 2 times a day Active  potassium chloride 10 mEq oral tablet, extended release 1 tab(s) orally once a day Active    Klonopin: Hives  Diazepam: Hives  Fish: GI Distress  Shellfish: GI Distress  Zoloft: Other  Vioxx: Other  Celebrex: Other  Levaquin: Other  Avelox: Other  Paxil: Other  Septra: Other  Codeine: Other  Macrobid: Unknown  Moxifloxacin: Unknown  doxazosin: GI Distress  Case History:  Family History Non-Contributory   Social History negative tobacco, negative ETOH, negative Illicit drugs   Review of Systems:  Fever/Chills No   Cough Yes   Sputum Yes   Abdominal Pain No  Diarrhea No   Constipation No   Nausea/Vomiting No   SOB/DOE Yes   Chest Pain No   Telemetry Reviewed NSR   Dysuria No   Physical Exam:  GEN well developed, well nourished, no acute distress   HEENT PERRL, hearing intact to voice, moist oral mucosa   NECK supple  trachea midline   RESP normal resp effort  no use of accessory muscles   CARD regular rate   ABD denies tenderness  nondistended   EXTR negative edema, Radial Rt=2+ Lt=NP  Brachial Rt=2 Lt=NP, hands pink and warm with brisk cap refill  bilateraly; pedal pulses nonpalpable bilaterally   SKIN No rashes, No ulcers   NEURO cranial nerves intact, follows commands, motor/sensory function intact   PSYCH alert, good insight   Nursing/Ancillary Notes: **Vital Signs.:   18-Dec-15 16:51  Vital Signs Type Q 8hr  Temperature Temperature (F) 97.5  Celsius 36.3  Pulse Pulse 95  Respirations Respirations 18  Systolic BP Systolic BP 962  Diastolic BP (mmHg) Diastolic BP (mmHg) 91  Mean BP 111  Pulse Ox % Pulse Ox % 92  Pulse Ox Activity Level  At rest  Oxygen Delivery Room Air/ 21 %   Hepatic:  14-Dec-15 08:44   Bilirubin, Total 0.4  Alkaline Phosphatase 82 (46-116 NOTE: New Reference Range 04/22/14)  SGPT (ALT) 16 (14-63 NOTE: New Reference Range 04/22/14)  SGOT (AST) 15  Total Protein, Serum 6.4  Albumin, Serum 3.6  Routine Micro:  14-Dec-15 08:43   Micro Text Report BLOOD CULTURE   COMMENT                   NO GROWTH AEROBICALLY/ANAEROBICALLY IN 5 DAYS   ANTIBIOTIC                       Culture Comment NO GROWTH AEROBICALLY/ANAEROBICALLY IN 5 DAYS  Result(s) reported on 20 Sep 2014 at 09:00AM.    08:51   Micro Text Report BLOOD CULTURE   COMMENT                   NO GROWTH AEROBICALLY/ANAEROBICALLY IN 5 DAYS   ANTIBIOTIC                       Culture Comment NO GROWTH AEROBICALLY/ANAEROBICALLY IN 5 DAYS  Result(s) reported on 20 Sep 2014 at 09:00AM.    08:52   Micro Text Report URINE CULTURE   COMMENT                   NO GROWTH IN 18-24 HOURS   ANTIBIOTIC                       Culture Comment NO GROWTH IN 18-24 HOURS  Result(s) reported on 16 Sep 2014 at 11:12AM.  Specimen Source CLEAN CATCH  16-Dec-15 00:16   Micro Text Report SPUTUM CULTURE   COMMENT                   APPEARS TO BE NORMAL FLORA AT 48 HOURS   GRAM STAIN                EXCELLENT SPECIMEN-90-100% WBC   GRAM STAIN                MANY WHITE BLOOD CELLS   GRAM STAIN                FEW GRAM  POSITIVE COCCI INCLUSTERS IN PAIRS   GRAM  STAIN                FEW GRAM NEGATIVE COCCO-BACILLI   ANTIBIOTIC                       Culture Comment APPEARS TO BE NORMAL FLORA AT 48 HOURS  Gram Stain 1 EXCELLENT SPECIMEN-90-100% WBC  Gram Stain 2 MANY WHITE BLOOD CELLS  Gram Stain 3 FEW GRAM POSITIVE COCCI IN CLUSTERS IN PAIRS  Gram Stain 4 FEW GRAM NEGATIVE COCCO-BACILLI  Result(s) reported on 19 Sep 2014 at 11:33AM.    17:08   Micro Text Report INFLUENZA A,B,H1N1 - PCR   INFLUENZA A BY PCR        NEGATIVE   INFLUENZA B BY PCR        NEGATIVE   H1N1 FLU BY PCR           NOT DETECTED   ANTIBIOTIC                       Cardiology:  14-Dec-15 08:39   Ventricular Rate 127  Atrial Rate 150  QRS Duration 86  QT 328  QTc 476  R Axis 51  T Axis 75  ECG interpretation Atrial fibrillation with rapid ventricular response Septal infarct (cited on or before 13-Jan-2014) Abnormal ECG When compared with ECG of 24-May-2014 11:07, Questionable change in initial forces of Anteroseptal leads Nonspecific T wave abnormality no longer evident in Lateral leads ----------unconfirmed---------- Confirmed by OVERREAD, NOT ([id number]), editor PEARSON, BARBARA ([id number]) on 09/16/2014 8:19:50 AM  Routine Chem:  14-Dec-15 08:44   Glucose, Serum  142  BUN 10  Creatinine (comp) 0.89  Sodium, Serum 139  Potassium, Serum  3.4  Chloride, Serum 105  CO2, Serum 26  Calcium (Total), Serum  8.4  Anion Gap 8  Osmolality (calc) 279  eGFR (African American) >60  eGFR (Non-African American) >60 (eGFR values <39mL/min/1.73 m2 may be an indication of chronic kidney disease (CKD). Calculated eGFR, using the MRDR Study equation, is useful in  patients with stable renal function. The eGFR calculation will not be reliable in acutely ill patients when serum creatinine is changing rapidly. It is not useful in patients on dialysis. The eGFR calculation may not be applicable to patients at the low and high extremes of body sizes, pregnant women, and vegetarians.)   Cardiac:  14-Dec-15 08:44   Troponin I < 0.02 (0.00-0.05 0.05 ng/mL or less: NEGATIVE  Repeat testing in 3-6 hrs  if clinically indicated. >0.05 ng/mL: POTENTIAL  MYOCARDIAL INJURY. Repeat  testing in 3-6 hrs if  clinically indicated. NOTE: An increase or decrease  of 30% or more on serial  testing suggests a  clinically important change)  Routine UA:  14-Dec-15 08:52   Color (UA) Straw  Clarity (UA) Clear  Glucose (UA) Negative  Bilirubin (UA) Negative  Ketones (UA) Negative  Specific Gravity (UA) 1.008  Blood (UA) Negative  pH (UA) 7.0  Protein (UA) Negative  Nitrite (UA) Negative  Leukocyte Esterase (UA) Negative (Result(s) reported on 15 Sep 2014 at 09:16AM.)  RBC (UA) 1 /HPF  WBC (UA) <1 /HPF  Bacteria (UA) NONE SEEN  Epithelial Cells (UA) <1 /HPF (Result(s) reported on 15 Sep 2014 at 09:16AM.)  Routine Coag:  14-Dec-15 08:44   Prothrombin 12.8  INR 1.0 (INR reference interval applies to patients on anticoagulant therapy. A single INR therapeutic range for coumarins is  not optimal for all indications; however, the suggested range for most indications is 2.0 - 3.0. Exceptions to the INR Reference Range may include: Prosthetic heart valves, acute myocardial infarction, prevention of myocardial infarction, and combinations of aspirin and anticoagulant. The need for a higher or lower target INR must be assessed individually. Reference: The Pharmacology and Management of the Vitamin K  antagonists: the seventh ACCP Conference on Antithrombotic and Thrombolytic Therapy. OZYYQ.8250 Sept:126 (3suppl): [id number]. A HCT value >55% may artifactually increase the PT.  In one study,  the increase was an average of 25%. Reference:  "Effect on Routine and Special Coagulation Testing Values of Citrate Anticoagulant Adjustment in Patients with High HCT Values." American Journal of Clinical Pathology 2006;126:400-405.)  Routine Hem:  14-Dec-15 08:44   WBC (CBC) 8.6  RBC  (CBC) 4.25  Hemoglobin (CBC) 12.3  Hematocrit (CBC) 38.2  Platelet Count (CBC)  125  MCV 90  MCH 29.0  MCHC 32.3  RDW  14.6  Neutrophil % 70.5  Lymphocyte % 20.2  Monocyte % 8.6  Eosinophil % 0.3  Basophil % 0.4  Neutrophil # 6.1  Lymphocyte # 1.7  Monocyte # 0.7  Eosinophil # 0.0  Basophil # 0.0 (Result(s) reported on 15 Sep 2014 at 09:12AM.)   CT:    17-Dec-15 17:19, CT Chest With Contrast  CT Chest With Contrast   REASON FOR EXAM:    recurent pneumoia  COMMENTS:   May transport without cardiac monitor    PROCEDURE: CT  - CT CHEST WITH CONTRAST  - Sep 18 2014  5:19PM     CLINICAL DATA:  Cough productive of yellowish sputum, weakness and  shortness of breath for2-3 days, fever, tachycardia, bronchitis  question pneumonia    EXAM:  CT CHEST WITH CONTRAST    TECHNIQUE:  Multidetector CT imaging of the chest was performed during  intravenous contrast administration. Sagittal and coronal MPR images  reconstructed from axial data set.    CONTRAST:  75 cc Omnipaque 300 IV    COMPARISON:  Chest radiograph 09/16/2014    FINDINGS:  Extensive atherosclerotic calcifications aorta, great vessels, and  coronary arteries.    Occlusion versus subtotal occlusion of theproximal LEFT subclavian  artery near origin.    Mild enlargement of cardiac chambers, most prominently the atria.  No thoracic adenopathy.    Few mildly prominent small bowel loops in LEFT upper quadrant.    Prominent stool in colon.    Emphysematous changes greatest in upper lobes.    Calcified granuloma RIGHT middle lobe image 30.    3 mm noncalcified nodules RIGHT middle lobe at image 28 and RIGHT  lower lobe image 25.    Remaining lungs clear.  No infiltrate, pleural effusion or pneumothorax.    No acute osseous findings.     IMPRESSION:  Extensive atherosclerotic disease aorta, great vessels and coronary  arteries.    Question occlusion versus subtotal occlusion of proximal  LEFT  subclavian artery near origin ; recommend comparison of blood  pressures between the LEFT upper and RIGHT upper extremities.    Enlargement of cardiac silhouette.    Emphysematous changes without acute infiltrate.  Noncalcified RIGHT lung nodules 3 mm diameter in RIGHT middle lobe  and RIGHT lower lobe, recommendation below.    If the patient is at high risk for bronchogenic carcinoma, follow-up  chest CT at 1 year is recommended. If the patient is at low risk, no  follow-up is needed. This recommendation follows the consensus  statement: Guidelines  forManagement of Small Pulmonary Nodules  Detected on CT Scans: A Statement from the [place] as  published in Radiology 2005; 237:395-400.      Electronically Signed    By: Lavonia Dana M.D.    On: 09/18/2014 17:29     Verified By: Burnetta Sabin, M.D.,    Impression 1.   Subclavian artery occlusion Patient denies that the left hand or arm have pain or any limitations.  She relates that she has had a stent placed in that location by her cardiologist who is the MD following her PAD.  I do not recommend any further intervention at this time and she should follow up with her cardiologist in January. 2.  Sepsis. The patient meets sepsis criteria because of tachycardia, fever and increased respiratory rate plus requirement of oxygen. This is secondary to acute bronchitis and possibly early pneumonia, which is still not evident on chest x-ray. We will give her IV Rocephin and give her IV steroid, nebulizer treatment, Spiriva and try to get a sputum culture. We will also repeat a chest x-ray tomorrow to compare and check for pneumonia.  3.  Hypertension. We will continue home medication, metoprolol.  4.  Diabetes. As she is on steroids, we will check her blood sugar and cover her with insulin sliding scale coverage.  5.  Hyperlipidemia. Continue atorvastatin.  6.  Chronic atrial fibrillation, is already on Cardizem extended-release  tablet. We will continue that in the hospital. 7.  Hypothyroidism. Continue levothyroxine.   Plan level 3 consult   Electronic Signatures: Hortencia Pilar (MD)  (Signed 19-Dec-15 17:04)  Authored: General Aspect/Present Illness, Home Medications, Allergies, History and Physical Exam, Vital Signs, Labs, Radiology, Impression/Plan   Last Updated: 19-Dec-15 17:04 by Hortencia Pilar (MD)

## 2015-01-24 NOTE — H&P (Signed)
PATIENT NAME:  Tracy Richards, Tracy Richards MR#:  [id number] DATE OF BIRTH:  [birth year]  DATE OF ADMISSION:  03/19/2014  REFERRING PHYSICIAN:  Dr. Benjaman Lobe.  PRIMARY CARE PHYSICIAN:  Dr. Cyndi Bender at [facility].   CARDIOLOGY:  Dr. Saralyn Pilar.  CHIEF COMPLAINT:  Bright red blood per rectum.   HISTORY OF PRESENT ILLNESS:  This is an 81 year old female with known history of hypertension, hypothyroidism, vertigo, PVD, and A. Fib, was recently hospitalized due to pneumonia and COPD exacerbation, the patient presents today with complaints of bright red blood per rectum, the patient reports she had first episode Monday morning.  She had a few episodes over the last 48 hours, as well, reports earlier on Sunday she had episode of vomiting, abdominal pain, the patient was sent by her PCP due to her bright red blood per rectum, her hemoglobin was around her baseline upon presentation to ED at 11.2, the patient had multiple blood work abnormalities including hypokalemia at 3.2, hyponatremia at 129, as well she appears to be in acute renal failure with a creatinine of 1.68 where her creatinine is usually normal, her INR was normal at 1.1, the patient had CT abdomen and pelvis done which did show evidence of diverticulosis, as well she have evidence of moderately inflamed abdominal wall hernia as well, but the patient did not have any pain or tenderness on the physical exam, the patient denies any history of lower or upper GI bleed in the past, no gastric ulcers, no gastritis, as well reports she never had endoscopy or colonoscopy done in the past.   PAST MEDICAL HISTORY: 1.  A.  Fib.  2.  Ureterovaginal prolapse.  3.  Peripheral vascular disease.  4.  Depression with anxiety.  5.  Hyperlipidemia.  6.  Idiopathic peripheral neuropathy.  7.  Hypertension.  8.  Hypothyroidism.  9.  Spinal stenosis.  10.  Renal artery stenosis.  11.  Osteoporosis.   12.  Carotid artery stenosis. 13.  Knee joint replacement. 14.   Vertigo. 15.  Diastolic dysfunction. 16.  COPD. 17.  Type 2 diabetes. 18.  Gastroesophageal reflux disease.  SOCIAL HISTORY:  No alcohol.  No smoking.  No illicit drugs.  Lives alone.   PAST SURGICAL HISTORY: 1.  Cataract surgery.  2.  Right knee replacement surgery.  3.  Appendectomy.  4.  Spinal surgery.  5.  Thyroidectomy.   FAMILY HISTORY:  Significant for bone cancer in her brother and mother had CVA.   ALLERGIES:  AVELOX, CELEBREX, CODEINE, DIAZEPAM, DOXAZOSIN, KLONOPIN, LEVAQUIN, MACROBID, METOPROLOL, MOXIFLOXACIN.   HOME MEDICATIONS:  Aspirin 81 mg daily, metoprolol 25 mg twice daily, losartan 100 mg daily, diltiazem 240 mg extended release, gabapentin 600 mg 3 times a day, meclizine 25 mg 3 times a day as needed, Claritin 10 mg daily, atorvastatin 40 mg daily, alprazolam 0.5 mg 2 times a day as needed, buspirone 7.5 mg twice daily, Ventolin as needed, torsemide 20 mg oral daily, potassium 10 mEq oral daily, fluticasone 50 mcg 2 times a day, levothyroxine 75 mg oral daily, Advair 250/50 1 puff twice daily.   REVIEW OF SYSTEMS:  CONSTITUTIONAL:  Denies any fever, chills, fatigue, weakness.  EYES:  Denies blurry vision, double vision, inflammation.  EARS, NOSE, THROAT:  Denies tinnitus, ear pain, hearing loss, epistaxis.  RESPIRATORY:  Denies cough, wheezing, hemoptysis, shortness of breath.  CARDIOVASCULAR:  Denies chest pain, edema, arrhythmia, palpitations, syncope.  GASTROINTESTINAL:  Denies nausea, vomiting, diarrhea.  Reports bright red blood per rectum.  Denies  any coffee-ground emesis.  GENITOURINARY:  Denies dysuria, hematuria, renal colic.  ENDOCRINE:  Denies polyuria, polydipsia, heat or cold intolerance.  HEMATOLOGY:  Denies anemia, easy bruising, bleeding diathesis.  INTEGUMENT:  Denies acne, rash or skin lesion.  MUSCULOSKELETAL:  Denies any arthritis, cramps, swelling.  Ambulates with a cane.  HEMATOLOGIC:  Denies history of CVA, TIA, ataxia, tremors.  Has  history of chronic vertigo.  PSYCHIATRIC:  Denies anxiety, insomnia or depression.   PHYSICAL EXAMINATION: VITAL SIGNS:  Temperature 98, pulse 75, respiratory rate 20, blood pressure 104/63, saturating 96% on oxygen.  GENERAL:  Well-nourished female who looks comfortable in bed, in no apparent distress.  HEENT:  Head atraumatic, normocephalic.  Pupils equal, reactive to light.  Pink conjunctivae.  Anicteric sclerae.  Moist oral mucosa.  NECK:  Supple.  No thyromegaly.  No JVD.  CHEST:  Good air entry bilaterally.  No wheezing, rales, rhonchi.  CARDIOVASCULAR:  S1, S2 heard.  No rubs, murmurs, or gallops.  Irregular irregular.  ABDOMEN:  Soft, nontender, nondistended.  Bowel sounds present.  EXTREMITIES:  +1 edema bilaterally.  No clubbing.  No cyanosis.  Pedal and radial pulses felt bilaterally.  Has chronic lower extremity mild erythema related to her edema.  PSYCHIATRIC:  Appropriate affect.  Awake, alert x 3.  Intact judgment and insight.  NEUROLOGIC:  Cranial nerves grossly intact.  Motor five out of five.  No focal deficits.  SKIN:  Normal skin turgor.  Warm and dry.  MUSCULOSKELETAL:  No joint effusion or erythema.   PERTINENT LABORATORY DATA:  Glucose 119, BUN 12, creatinine 1.68, sodium 129, potassium 3.2, chloride 94, CO2 25, ALT 21, AST 23, alk phos 84.  White blood cells 10.2, hemoglobin 11.2, hematocrit 34.1, platelets 242,000, INR 1.1.   IMAGING STUDIES:  CT abdomen and pelvis without contrast showing scattered diverticulosis and herniation of short segment of distal descending colon into a small left lower quadrant anterior abdominal wall hernia with mild inflammation.   ASSESSMENT AND PLAN: 1.  Bright red blood per rectum, this is most likely diverticular bleed, currently her hemoglobin is stable.  No indication for transfusion, we will keep nothing by mouth.  We will monitor her hemoglobin every six hours.  We will keep active type and screen.  We will consult gastroenterology  service in the morning, we will keep her on Protonix 40 mg intravenous twice daily.  2.  Incidental finding in CT abdomen and pelvis of inflamed abdominal wall hernia.  We will consult surgical service in the morning.  3.  Hyponatremia.  We will hydrate.  We will hold her diuresis.  4.  Hypokalemia.  We will hydrate and we will replace.  5.  Acute renal failure.  We will hold the patient's diuresis.  We will hold her losartan.  We will continue with gentle hydration.  6.  Atrial fibrillation, currently rate controlled.  Continue with metoprolol.  We will hold Cardizem.  If she is stable we will resume her back on Cardizem and she is not a candidate for anticoagulation given her high risk of fall in the past and currently her lower gastrointestinal bleed.  7.  Hypertension.  Blood pressure acceptable.  Continue with home medications.  8.  Hypothyroidism.  Continue with Synthroid.   9.  Chronic diastolic congestive heart failure, appears to be compensated.  We will hold her diuresis.  We will hydrate gently given her renal failure.  10.  Chronic obstructive pulmonary disease.  No active wheezing.  Continue with  home medications and as needed albuterol.  11.  Deep vein thrombosis prophylaxis.  Sequential compression device and TED hose.  No chemical anticoagulation due to her lower gastrointestinal bleed. 12.  CODE STATUS:  THE PATIENT HAS A LIVING WILL.  DAUGHTER IS THE HEALTHCARE POWER OF ATTORNEY AND THE PATIENT IS A FULL CODE.   Total time spent on admission and patient care 55 minutes.    ____________________________ Albertine Patricia, MD dse:ea D: 03/19/2014 00:54:00 ET T: 03/19/2014 01:43:04 ET JOB#: [id number]  cc: Albertine Patricia, MD, <Dictator> DAWOOD Graciela Husbands MD ELECTRONICALLY SIGNED 03/26/2014 23:32

## 2015-01-24 NOTE — H&P (Signed)
PATIENT NAME:  Tracy Richards, Tracy Richards MR#:  [id number] DATE OF BIRTH:  [birth year]  DATE OF ADMISSION:  12/31/2013  PRIMARY CARE PHYSICIAN: Dr. Cyndi Bender at [facility]: Dr. Saralyn Pilar  CHIEF COMPLAINT: Vertigo and tachycardia.   HISTORY OF PRESENT ILLNESS: An 81 year old female who has past medical history of hypertension, hypothyroidism, chronic vertigo, some peripheral vascular disease, and carotid artery blockages in the past, who has been seeing her primary care doctor for vertigo for a long time, and is receiving some injections whenever vertigo is out of control. For the last few days, her vertigo is getting worse. She had episodes of vomiting, room spinning, and somewhat dizziness, with having some shivering sensation in upper stomach and chest intermittently throughout the night. No chest pain, no shortness of breath, but had some sensation of heart racing. No syncopal episode. Went to PMD's office today and noted to have tachycardia, so sent in for further workup to Emergency Room. In the ER, she was noticed having atrial fibrillation with rapid ventricular response, rate up to 130s and 140s, so she was given injection of Cardizem 15 mg by ER physician, which slowed it down, but remained in irregular rhythm, rate up to 70s and 80s, and in between sometimes going up to 90s and 100s, so given to hospitalist team for further management.   REVIEW OF SYSTEMS:   CONSTITUTIONAL: Negative for fever, fatigue, weakness, pain or weight loss.  EYES: No blurring, double vision, discharge or redness.  EARS, NOSE, THROAT: No tinnitus, ear pain or hearing loss. RESPIRATORY: No cough, wheezing, hemoptysis, or shortness of breath.  CARDIOVASCULAR: The patient did not have any chest pain, but had some feeling of heart racing. No syncopal episode or arrhythmia.   GASTROINTESTINAL: The patient had some nausea and vomiting, with feeling of dizziness yesterday. Denies any abdominal pain or  diarrhea. GENITOURINARY: No dysuria, hematuria, increased sweating.  ENDOCRINE:  No increased sweating. No heat or cold intolerance.  SKIN: No acne, rashes, or lesions.  MUSCULOSKELETAL: No pain or swelling of the joints. NEUROLOGIC: No numbness, weakness, tremor, vertigo.  PSYCHIATRIC: No anxiety, insomnia, bipolar disorder.   PAST MEDICAL HISTORY:  1.  Uterovaginal prolapse.  2.  Peripheral vascular disease.  3.  Depression with anxiety.  4.  Hypercholesterolemia.  5.  Idiopathic peripheral neuropathy.  6.  Hypertension.  7.  Hypothyroidism.  8.  Spinal stenosis.  9.  Renal artery stenosis.  10.  Osteoporosis.  11.  Carotid stenosis. 12.  Knee joint replacement.  13.  Vertigo. 14.  Diastolic dysfunction.  15.  Postherpetic neuralgia.  16.  COPD.   17.  Diabetes type 2.  18.  Gastroesophageal reflux disease.   SOCIAL HISTORY: Lives alone. Walks sometimes with a cane. Denies any alcohol, smoking, or illegal drug use.   PAST SURGICAL HISTORY: Cataract surgery, knee replacement surgery, appendix surgery, spinal surgery, and thyroidectomy.   FAMILY HISTORY: Positive for bone cancer in brother, and mother had a major stroke.   HOME MEDICATIONS: 1.  Alprazolam 0.5 mg 2 times a day as needed for anxiety.  2.  Aspirin 81 mg once a day.  3.  Atorvastatin 40 mg once a day.  4.  Buspirone 7.5 mg oral tablet 2 times a day.  5.  Claritin 10 mg oral tablet once a day.  6.  Felodipine 10 mg oral tablet extended-release once a day.  7.  Fluticasone nasal spray 2 sprays nasal once a day for allergies. 8.  Gabapentin 600 mg  oral tablet 3 times a day.  9.  KCl 10 mEq once a day.  10.  Levothyroxine 100 mcg once a day.  11.  Losartan 100 mg oral once a day. 12.  Meclizine 25 mg oral 3 times a day.  13.  Multivitamin once a day.  14.  Calcium and vitamin D tablet 2 times a day. 15.  Torsemide 20 mg oral once a day.  16.  Tramadol 50 mg oral 3 times a day.  17.  Ventolin 2 puffs  inhalation 4 to 6 hours.   VITAL SIGNS IN ER:  Pulse rate on presentation was 120, now 70s, but still in between, ranging up to 110, respirations 20, blood pressure 129/60, and pulse ox 95% on room air.   PHYSICAL EXAMINATION:  GENERAL: The patient is fully alert and oriented to time, place, and person. Does not appear in any acute distress.  HEENT: Head and neck atraumatic. Conjunctiva pink. Oral mucosa moist.  NECK: Supple. No JVD.  RESPIRATORY: Bilateral clear and equal air entry.  CARDIOVASCULAR: S1, S2 present, irregular. No murmur present.  ABDOMEN: Soft, nontender. Bowel sounds present. No organomegaly.  SKIN: No rashes.  LEGS:  No edema.  NEUROLOGIC: Power 5/5. Follows commands. Moves all 4 limbs.  PSYCHIATRIC: No gross psychiatric problem at this time.  IMPORTANT LAB RESULTS:  Glucose 136, BUN 11, creatinine 0.71, sodium 135, potassium 4.1, chloride 101, CO2 of 24, lipase is 94. Troponin is 0.11. WBC is 8.0, hemoglobin 13.6, platelet count is 244. INR is 0.9. Chest x-ray portable shows mild airspace disease, lower lobe edema or infection.   ASSESSMENT AND PLAN: An 81 year old female with past medical history of hypertension, hypothyroidism, chronic vertigo, presented from her primary care doctor's office with tachycardia, with atrial fibrillation and rapid ventricular response, responded to Cardizem injections here.   1.  Atrial fibrillation with rapid ventricular response. This is new for patient, but she was under workup for this. She was also given an event recorder by Dr. Saralyn Pilar' office, and she was supposed to have an appointment this Thursday for followup after echocardiogram also. She is given injection Cardizem, and heart rate came under control, so will give oral Cardizem long-acting and let Dr. Saralyn Pilar follow in the hospital. Currently I would not start on any anticoagulation, and will leave it up to Dr. Saralyn Pilar for further decision.   2.  Hypertension. Will continue  losartan. Blood pressure is under control.   3.  Hypercholesterolemia. Continue atorvastatin.   4.  Hypothyroidism. Will check TSH level and continue levothyroxine.   5.  Anxiety disorder. Continue alprazolam.   6.  Neuropathy. Continue gabapentin.  7.  Vertigo. Continue meclizine.   Code status is FULL CODE.   Total time spent on this admission is 50 minutes.    ____________________________ Ceasar Lund Anselm Jungling, MD vgv:mr D: 12/31/2013 20:33:12 ET T: 12/31/2013 21:07:09 ET JOB#: [id number]  cc: Ceasar Lund. Anselm Jungling, MD, <Dictator> Isaias Cowman, MD  Vaughan Basta MD ELECTRONICALLY SIGNED 01/02/2014 11:24

## 2015-01-24 NOTE — Consult Note (Signed)
PATIENT NAME:  Tracy Richards, Tracy Richards MR#:  [id number] DATE OF BIRTH:  [birth year]  DATE OF CONSULTATION:  05/24/2014  REFERRING PHYSICIAN:  Dr. Doy Hutching. CONSULTING PHYSICIAN:  Isaias Cowman, MD  PRIMARY CARE PHYSICIAN:  Dr. Tobie Lords.  CARDIOLOGIST:  Isaias Cowman, MD.  REASON FOR HOSPITALIZATION: Shortness of breath and pneumonia.   HISTORY OF PRESENT ILLNESS: The patient is an 81 year old female with history of chronic diastolic congestive heart failure, chronic atrial fibrillation, and recent history of pneumonia. The patient was hospitalized March 15 with pneumonia. According to her daughters, she has never fully recovered. She currently lives independently in an apartment. Her daughter was staying with her last evening. The patient has had 2-3 day history of viral-like illness and developed shortness of breath and presented to [facility] Emergency Room this morning. The patient was hypoxic. Admission labs were notable for a negative troponin. White count was elevated at 15,100. Chest x-ray revealed left mid lung opacity most consistent with pneumonia. The patient was started on broad-spectrum antibiotics with intravenous Zithromax and ceftriaxone.   PAST MEDICAL HISTORY:  1.  Diastolic congestive heart failure.  2.  Chronic atrial fibrillation.  3.  Recent pneumonia.  4.  Diabetes.  5.  Hypertension.  6.  Hyperlipidemia.  7.  Osteoarthritis.  8.  Osteoporosis.  9.  History of renal artery stenosis.  10.  Peripheral neuropathy. 11.  Spinal stenosis.  MEDICATIONS: Lopressor 25 mg b.i.d., Lasix 20 mg b.i.d., Cardizem CD 240 mg daily, Lipitor 40 mg at bedtime, aspirin 81 mg daily, Spiriva 1 capsule daily, Klor-Con 10 mg b.i.d., Protonix 40 mg b.i.d., meclizine 25 mg t.i.d. p.r.n., Synthroid 75 mcg daily, gabapentin 600 mg t.i.d., Advair 250/50 one puff b.i.d., Flonase 2 puffs each nostril daily, Colace 100 mg b.i.d., BuSpar 7.5 mg b.i.d., Lipitor 40 mg at bedtime, Xanax 0.5 mg b.i.d.   SOCIAL  HISTORY: The patient currently lives alone in the apartment. She denies tobacco, EtOH abuse.   FAMILY HISTORY: Positive for coronary artery disease, as well as prior stroke.   REVIEW OF SYSTEMS:  CONSTITUTIONAL: The patient has had mild fever and chills.  EYES: No blurry vision.  EARS: Hearing loss.  RESPIRATORY: The patient has had shortness of breath with cough.  CARDIOVASCULAR: The patient denies chest pain.  GASTROINTESTINAL: The patient has had some mild diarrhea.  GENITOURINARY: No dysuria or hematuria.  ENDOCRINE: No polyuria or polydipsia.  HEMATOLOGICAL: No easy bruising or bleeding.  MUSCULOSKELETAL: The patient has some generalized myalgias.  NEUROLOGIC: No focal muscle weakness or numbness.  PSYCHOLOGICAL: No depression or anxiety.   PHYSICAL EXAMINATION:  VITAL SIGNS: Blood pressure was 117/71, pulse 104, respirations 24, temperature 98.3, pulse oximetry 95%.  HEENT: Pupils equal and reactive to light and accommodation.  NECK: Supple without thyromegaly.  LUNGS: Decreased breath sounds in both bases.  HEART: Normal JVP. Normal PMI. Irregularly irregular rhythm. Normal S1, S2. No appreciable gallop, murmur, or rub.  ABDOMEN: Soft and nontender. Pulses were intact bilaterally.  MUSCULOSKELETAL: Normal muscle tone.  NEUROLOGIC: The patient is alert and oriented x 3. Motor and sensory both grossly intact.   IMPRESSION: An 81 year old female with known history of chronic diastolic congestive heart failure and atrial fibrillation who is now admitted with shortness of breath with chest x-ray consistent with recurrent pneumonia, with elevated white count, mild fever, and cough.  RECOMMENDATIONS:  1.  Agree with overall current therapy.  2.  Would defer full dose anticoagulation.  3.  Continue current CHF medications including Lasix.  4.  Would defer any further cardiac diagnostics at this time.   ____________________________ Isaias Cowman, MD ap:TT D: 05/24/2014  15:13:07 ET T: 05/24/2014 16:24:18 ET JOB#: [id number]  cc: Isaias Cowman, MD, <Dictator> Isaias Cowman MD ELECTRONICALLY SIGNED 06/05/2014 16:00

## 2015-01-24 NOTE — Discharge Summary (Signed)
PATIENT NAME:  Tracy Richards, Tracy Richards MR#:  [id number] DATE OF BIRTH:  1928/12/22  DATE OF ADMISSION:  03/18/2014 DATE OF DISCHARGE:  03/27/2014  ADDENDUM: To interim discharge summary dictated yesterday by Dr. Manuella Ghazi.   HOSPITAL COURSE: After 24th June, the patient remained stable, able to taper off from high flow nasal cannula to 2 liters nasal cannula oxygen. We had arrangement done by social worker and we are discharging her to a rehab. For further details, please see interim discharge summary on 24th June.   ____________________________ Ceasar Lund. Anselm Jungling, MD vgv:sb D: 03/27/2014 11:02:29 ET T: 03/27/2014 11:19:44 ET JOB#: [id number]  cc: Ceasar Lund. Anselm Jungling, MD, <Dictator> Vaughan Basta MD ELECTRONICALLY SIGNED 03/29/2014 20:15

## 2015-01-24 NOTE — Consult Note (Signed)
PATIENT NAME:  Tracy Richards, Tracy Richards MR#:  [id number] DATE OF BIRTH:  [birth year]  DATE OF CONSULTATION:  03/19/2014  CONSULTING PHYSICIAN:  Corky Sox. Earle, PA-C  REFERRING PHYSICIAN:  Dr. Waldron Labs.   REASON FOR CONSULTATION: Bright red blood per rectum.   HISTORY OF PRESENT ILLNESS: This is a pleasant 81 year old female who states that she began experiencing nausea, vomiting and diarrhea this past Sunday June 14. As a result, she saw her primary care physician who gave her 2 antibiotics, and she was not doing any better and as a result, came into the Emergency Department. She had been seeing blood accompanied with her stool. She admits that her diarrhea resolved within 24 to 48 hours on its own but the rectal bleeding had persisted. She has not had any nausea or vomiting for the past 24 hours. Upon further work-up a CT scan of the abdomen and pelvis was obtained on the patient showing diverticulosis with an inflamed abdominal wall hernia involving the distal ascending colon without any evidence of obstruction or strangulation. She is not complaining of any abdominal pain or tenderness in that area. She has not had any further rectal bleeding today. She has had two bowel movements so far, and it is 12:00 noon. She suspects that her stool is looser today secondary to the contrast given for her CT scan yesterday as she had been having a day without diarrhea yesterday before the scan. She is denying any abdominal pain. No vomiting for two days. No fever or chills. No recent travel or sick contacts. No recent antibiotic use. No chest pain or shortness of breath. Hemoglobin has been stable at 11.2 and 11.0. LFTs are within normal limits. Vital signs are stable.   The patient was admitted, has been made n.p.o. status, started on Protonix twice daily and we are checking hemoglobin every six hours. Of note, she has never had an EGD or colonoscopy.   PAST MEDICAL HISTORY: Atrial fibrillation, uterovaginal prolapse,  depression, anxiety, hyperlipidemia, hypertension, spinal stenosis, hypothyroidism, peripheral vascular disease,  peripheral neuropathy, osteoporosis, renal artery stenosis, vertigo, COPD, diastolic heart dysfunction, diabetes mellitus type 2, GERD, carotid artery stenosis.  PAST SURGICAL HISTORY: Knee replacement on the right side, cataract surgery, appendectomy, spinal surgery, thyroidectomy.   SOCIAL HISTORY: The patient denies any alcohol, tobacco or illicit drug use. She lives at home by herself.   ALLERGIES: Avelox, Celebrex, metoprolol, codeine, diazepam, doxazosin, Klonopin, Levaquin, Macrobid and moxifloxacin.   FAMILY HISTORY: There is no known family history of GI malignancy, colon polyps or IBD. There is bone cancer in her brother.   REVIEW OF SYSTEMS:  A 10 system review of systems was obtained on the patient. Pertinent positives are mentioned above and otherwise negative.   HOME MEDICATIONS: Baby aspirin, metoprolol, losartan, diltiazem, meclizine, Claritin, gabapentin, atorvastatin, Ventolin, buspirone, fluticasone, potassium, alprazolam, torsemide, levothyroxine and Advair.   OBJECTIVE: VITAL SIGNS: Blood pressure 103/53, heart rate 82, respirations 20, temperature 97.9, bedside pulse oximetry 96%.  GENERAL: This is a pleasant 81 year old female resting quietly and comfortably in bed in no acute distress. Alert and oriented x3.  HEAD: Atraumatic, normocephalic.  NECK: Supple. No lymphadenopathy noted.  HEENT: Sclerae anicteric. Mucous membranes moist.  PULMONARY: Respirations are even and unlabored. Clear to auscultation bilateral in anterior lung fields.  HEART: Regular rate and rhythm. S1, S2 noted.  ABDOMEN: Soft, nontender, nondistended. Normoactive bowel sounds are noted in all four quadrants. No guarding or rebound. No masses, hernias or organomegaly appreciated.  RECTAL: Deferred.  PSYCHIATRIC:  Appropriate mood and affect.  EXTREMITIES: Negative for lower extremity  edema, 2+ pulses noted in bilateral upper extremities.   LABORATORY DATA: White blood cells 8, hemoglobin 11.0, hematocrit 31.9, MCV 94, platelets 206, sodium 132, potassium 3.6, BUN 12, creatinine 1.95, glucose 144. LFTs are within normal limits PT 14.1, INR 1.1.   IMAGING: A CT scan of the abdomen and pelvis was obtained on the patient showing signs of diverticulosis in the colon. There is also an inflamed abdominal wall hernia involving the distal ascending colon in the left lower quadrant, revealing an anterior wall hernia without any evidence of obstruction or strangulation. Of note, the patient is not tender or complaining of pain in this area.   ASSESSMENT: 1.  Rectal bleeding.  2.  Diarrhea.  3.  Nausea and vomiting. This resolved about two days ago.  4.  CT scan revealing diverticulosis.   PLAN:  I have discussed this patient's case in detail with Dr. Arther Dames who is involved in the development of the patient's plan of care. At this point because she has been experiencing a change in bowel habits with accompanying rectal bleeding, we do recommend a colonoscopy. She has never had a colonoscopy before. Differential diagnosis was discussed and this may be a diverticular bleed that certainly without ever having a colonoscopy in the past and new onset rectal bleeding, this does warrant a full luminal evaluation.  We do agree with checking hemoglobin and hematocrit q.6 hours and giving her Protonix twice daily. We will plan on a colonoscopy tomorrow and administer a bowel prep today and keep her n.p.o. after midnight tonight. This was explained to the patient including risks and benefits of this procedure and all questions were answered.   Thank you so much for this consultation and for allowing [place] to participate in the patient's plan of care.   ATTENDING GASTROENTEROLOGIST: Dr. Rayann Heman.  ____________________________ Corky Sox. Earle, PA-C kme:dp D: 03/19/2014 13:45:09 ET T: 03/19/2014  14:17:18 ET JOB#: [id number]  cc: Corky Sox. Earle, PA-C, <Dictator> [place] PA ELECTRONICALLY SIGNED 03/19/2014 15:26

## 2015-01-24 NOTE — H&P (Signed)
PATIENT NAME:  Tracy Richards, Tracy Richards MR#:  [id number] DATE OF BIRTH:  [birth year]  DATE OF ADMISSION:  01/11/2014  REFERRING PHYSICIAN:  Dr. Lurline Hare.    PRIMARY CARE PHYSICIAN:  Dr. Cyndi Bender at [facility].  PRIMARY CARDIOLOGIST:  Dr. Saralyn Pilar.   CHIEF COMPLAINT:  Shortness of breath and cough.   HISTORY OF PRESENT ILLNESS:  This is an 81 year old female with known history of hypertension, hypothyroidism, vertigo, peripheral vascular disease, the patient was recently discharged on April the 3rd from [facility] after diagnosis of new onset A. Fib, she is currently normal sinus rhythm, the patient is not a candidate for anticoagulation due to her history of multiple falls, the patient presents today with multiple complaints, mainly shortness of breath, cough, productive sputum, yellow in color, as well, she reports some chills at home, the patient was hypoxic in the ED upon presentation with her saturation was in the high 80s on room air, the patient is not known to have any history of chronic respiratory failure, not requiring oxygen at home at baseline, as well she is known to have history of mild COPD, the patient has significant wheezing upon presentation, the patient's chest x-ray does show a right lung opacity.  She received multiple neb treatments, started on IV antibiotic for healthcare-acquired pneumonia.  Hospitalist service was requested to admit the patient, she denies any chest pain, any vomiting, any nausea, any coffee-ground emesis, any dysuria or polyuria.   PAST MEDICAL HISTORY: 1.  A. Fib.  2.  Ureterovaginal prolapse.  3.  Peripheral vascular disease.  4.  Depression with anxiety. 5.  Hyperlipidemia.  6.  Idiopathic peripheral neuropathy.  7.  Hypertension.  8.  Hypothyroidism.  9.  Spinal stenosis.  10.  Renal artery stenosis.  11.  Osteoporosis.  12.  Carotid artery stenosis.  13.  Knee joint replacement.  14.  Vertigo.  15.  Diastolic dysfunction. .  17.  COPD.   18.  Type 2 diabetes.  19.  Gastroesophageal reflux disease.   SOCIAL HISTORY:  Lives alone.  Ambulates with a cane.  No alcohol.  No smoking.  No illicit drugs.   PAST SURGICAL HISTORY:  1.  Cataract surgery.  2.  Right knee replacement surgery.  3.  Appendectomy.  4.  Spinal surgery.  5.  Thyroidectomy.   FAMILY HISTORY:  Significant for bone cancer in brother and mother had a CVA.   ALLERGIES:  THE PATIENT HAS MULTIPLE ALLERGIES INCLUDING AVELOX, CELEBREX, CODEINE, DIAZEPAM, DOXAZOSIN, KLONOPIN, LEVAQUIN, MACROBID, METOPROLOL, MOXIFLOXACIN.   HOME MEDICATIONS: 1.  Aspirin 81 mg oral daily.  2.  Metoprolol 25 mg oral 2 times a day.  3.  Losartan 100 mg oral daily.  4.  Diltiazem 240 mg extended release oral daily.  5.  Gabapentin 600 mg oral 3 times a day.  6.  Meclizine 25 mg oral 3 times a day as needed.  7.  Claritin 10 mg oral daily.  8.  Atorvastatin 40 mg oral daily.  9.  Alprazolam 0.5 mg 2 times a day as needed.  10.  Buspirone 7.5 mg oral 2 times a day.  11.  Metoprolol tartrate 25 mg oral 2 times a day.  12.  Ventolin as needed.  13.  Torsemide 20 mg oral daily.  14.  Klor-Con 10 mEq oral daily.  15.  Fluticasone 50 mcg two sprays daily.  16.  Levothyroxine 75 mcg oral daily.   REVIEW OF SYSTEMS:  CONSTITIONAL:  The patient denies any fever,  but reports some chills.  Reports fatigue, weakness.  Denies weight gain, weight loss.  EYES:  Denies blurry vision, double vision, inflammation.  EARS, NOSE, THROAT:  Denies tinnitus, ear pain, hearing loss, epistaxis or discharge.  RESPIRATORY:  Reports cough, wheezing, shortness of breath, productive sputum and history of COPD.  CARDIOVASCULAR:  Denies chest pain, edema, arrhythmia, palpitations, syncope.  GASTROINTESTINAL:  Denies nausea, vomiting, diarrhea, abdominal pain, hematemesis, melena.  GENITOURINARY:  Denies dysuria, hematuria, or renal colic.  ENDOCRINE:  Denies polyuria, polydipsia, heat or cold intolerance.   HEMATOLOGY:  Denies anemia, easy bruising, bleeding diathesis.  INTEGUMENT:  Denies acne, rash or skin lesion.  MUSCULOSKELETAL:  Denies any joint effusion, arthritis, cramps, swelling.  Ambulates with a cane.  NEUROLOGIC:  Denies any history of CVA, TIA, ataxia, tremors.  Reports history of chronic vertigo.  PSYCHIATRIC:  Reports history of anxiety.  Denies any schizophrenia, depression or insomnia.   PHYSICAL EXAMINATION: VITAL SIGNS:  Temperature 101.5, pulse 88, respiratory rate 24, blood pressure 129/55, saturating 94% on oxygen.  GENERAL:  Elderly female looks comfortable in bed in no apparent distress.  HEENT:  Head atraumatic, normocephalic.  Pupils are equal and reactive to light.  Pink conjunctivae.  Anicteric sclerae.  Dry oral mucosa.  NECK:  Supple.  No thyromegaly.  No JVD.  CHEST:  Good air entry bilaterally.  Has with scattered wheezing and right lung rales.  CARDIOVASCULAR:  S1, S2 heard.  No rubs, murmurs or gallops.  ABDOMEN:  Soft, nontender, nondistended.  Bowel sounds present.  EXTREMITIES:  No edema.  No clubbing.  No cyanosis.  Pedal and radial pulses felt bilaterally.  PSYCHIATRIC:  Appropriate affect.  Awake, alert x 3.  Intact judgment and insight.  NEUROLOGIC:  Cranial nerves grossly intact.  Motor five out of five.  No focal deficits.  MUSCULOSKELETAL:  No joint effusion or erythema.  SKIN:  Normal skin turgor.  Warm and dry.   PERTINENT LABORATORY DATA:  Glucose 157.  BNP 774, BUN 15, creatinine 0.93, sodium 131, potassium 4.2, chloride 98, CO2 26.  Troponin less than 0.02.  White blood cells 15.5, hemoglobin 11.8, hematocrit 35.4, platelet 214.   IMAGING STUDIES:  Chest x-ray showing right lung capacity and cardiac enlargement with pulmonary vascular congestion.   ASSESSMENT AND PLAN: 1.  Sepsis.  The patient presents with fever and leukocytosis, this is most likely related to her pneumonia.  We will send blood cultures, the patient will be started on  broad-spectrum intravenous antibiotics and this is most likely related to patient's pneumonia.  2.  Acute respiratory failure, the patient presents with hypoxic respiratory failure as her oxygen saturation were in the high 80s upon presentation on room air, usually she does not use room air, this is most likely related to her chronic obstructive pulmonary disease exacerbation and pneumonia.  3.  Healthcare-acquired pneumonia, the patient has multiple drug allergies.  She will be started on IV Zosyn and azithromycin.  We will follow on blood cultures.  4.  Chronic obstructive pulmonary disease exacerbation, the patient presents with significant wheezing.  We will keep her on DuoNebs every four hours, as needed albuterol, we will start her on low-dose Solu-Medrol 80 mg every eight hours.   5.  History of diastolic congestive heart failure.  Currently appears to be compensated.  We will hold her torsemide.  We will keep her on gentle hydration.  We will resume her back on diuresis before discharge.  6.  History of atrial fibrillation, currently  appears to be rate controlled, normal sinus rhythm.  She is not a candidate for anticoagulation due to her high risk of fall, we will continue her on metoprolol and Cardizem.  7.  Hypertension:  Blood pressure acceptable.  Continue with home medications.  8.  Hypothyroidism.  Continue with Synthroid.  9.  Deep vein thrombosis prophylaxis.  SubQ heparin. 10.  CODE STATUS WAS DISCUSSED WITH THE PATIENT AND DAUGHTER AT BEDSIDE, REPORTS SHE HAS A LIVING WILL, DAUGHTER IS HER HEALTHCARE POWER OF ATTORNEY AND THE PATIENT IS A FULL CODE, BUT DOES NOT WISH TO REMAIN ON LIFE SUPPORT FOR LENGTHY PERIOD OF TIME IF NEEDED.   Total time spent on admission and patient care 55 minutes.      ____________________________ Albertine Patricia, MD dse:ea D: 01/11/2014 06:16:40 ET T: 01/11/2014 06:56:13 ET JOB#: [id number]  cc: Albertine Patricia, MD, <Dictator> Tegan Burnside Graciela Husbands MD ELECTRONICALLY SIGNED 01/12/2014 2:49

## 2015-01-24 NOTE — H&P (Signed)
PATIENT NAME:  Tracy Richards, Tracy Richards MR#:  [id number] DATE OF BIRTH:  1929-01-16  DATE OF ADMISSION:  12/31/2013  ADDENDUM   ASSESSMENT AND PLAN: Elevated troponin: Most likely this is as a response of her atrial fibrillation with rapid ventricular response. Will monitor on telemetry and will get serial troponins. She already had echocardiogram done by Dr. Clayborn Bigness last week as per the records available from primary care physician's office, so we would leave it up to the primary cardiologist to follow in the hospital and do further work-up. Currently there is no need for anticoagulation. Will wait for cardiology.    ____________________________ Ceasar Lund Anselm Jungling, MD vgv:jcm D: 12/31/2013 21:18:31 ET T: 12/31/2013 21:24:48 ET JOB#: [id number]  cc: Ceasar Lund. Anselm Jungling, MD, <Dictator> Vaughan Basta MD ELECTRONICALLY SIGNED 01/02/2014 11:24

## 2015-01-24 NOTE — Consult Note (Signed)
PATIENT NAME:  Tracy Richards, Tracy Richards MR#:  [id number] DATE OF BIRTH:  [birth year]  DATE OF CONSULTATION:    REFERRING PHYSICIAN:  Dr. Posey Pronto CONSULTING PHYSICIAN:  Corey Skains, MD  REASON FOR CONSULTATION:  Atrial fibrillation with rapid ventricular rate, elevated troponin, renal artery stenosis, diabetes, carotid atherosclerosis needing further treatment options.   CHIEF COMPLAINT:  "I am short of breath."   HISTORY OF PRESENT ILLNESS:  This is an 81 year old female with known chronic obstructive pulmonary disease, renal artery stenosis and diabetes who has had new onset, weakness and fatigue, shortness of breath over the last several weeks culminating in needing for further evaluation when she had significant shortness of breath in the Emergency Room.  She also had atrial fibrillation with rapid ventricular rate associated with this issue.  Upon arrival, she did have medication management which has been treating her current issues with better heart rate control.  The patient has had an elevated troponin consistent with demand ischemia at 0.14.  Her carotid atherosclerosis previously has not had significant new symptoms.  Her current shortness of breath is improved.   REMAINDER OF REVIEW OF SYSTEMS:  Negative for vision change, ringing in the ears, hearing loss, cough, congestion, heartburn, nausea, vomiting, diarrhea, bloody stools, stomach pain, extremity pain, leg weakness, cramping of the buttocks, known blood clots, headaches, blackouts, dizzy spells, nosebleeds, congestion, trouble swallowing, frequent urination, urination at night, muscle weakness, numbness, anxiety, depression, skin lesions, skin rashes.   PAST MEDICAL HISTORY: 1.  Renal artery stenosis.  2.  Diabetes.  3.  COPD.  4.  Carotid atherosclerosis.   FAMILY HISTORY:  No family members with early onset of cardiovascular disease or hypertension.   SOCIAL HISTORY:  Remote tobacco use.  Currently denies alcohol or tobacco  use.  ALLERGIES:  AS LISTED.   MEDICATIONS:  As listed.   PHYSICAL EXAMINATION: VITAL SIGNS:  Blood pressure is 122/68 bilaterally, heart rate is 102 upright, reclining, and irregular.  GENERAL:  She is a well appearing female in no acute distress.  HEAD, EYES, EARS, NOSE, THROAT:  No icterus, thyromegaly, ulcers, hemorrhage, or xanthelasma.  CARDIOVASCULAR:  Irregularly irregular with normal S1 and S2.  A two out of six apical murmur consistent with mitral regurgitation, two out of six right upper sternal border murmur nonradiating.  PMI is inferiorly displaced.  Carotid upstroke normal without bruit.  Jugular venous pressure is normal.  LUNGS:  Have a few expiratory wheezes.  ABDOMEN:  Soft, nontender, without hepatosplenomegaly or masses.  Abdominal aorta is normal size without bruit.  EXTREMITIES:  2+ bilateral pulses in dorsal, pedal, radial and femoral arteries without lower extremity edema, signs or ulcers.  NEUROLOGIC:  She is oriented to time, place, and person, with normal mood and affect.   ASSESSMENT:  An 81 year old female with chronic obstructive pulmonary disease, hypertension, hyperlipidemia, diabetes, carotid atherosclerosis with new onset of significant atrial fibrillation with rapid ventricular rate, and elevated troponin consistent with demand ischemia.   RECOMMENDATIONS: 1.  Heart rate control with beta blocker, calcium channel blocker combination if necessary for heart rate control between 60 and 100 beats per minute.  2.  Echocardiogram for left ventricular systolic dysfunction, valvular heart disease.  3.  Anticoagulation for further risk reduction in stroke with atrial fibrillation, if not contraindicated at this time with no current evidence of bleeding complications or concerns of fall risk.  4.  Continue ambulation and follow for need in adjustments of medications.  5.  Further treatment of shortness of  breath if caused by chronic obstructive pulmonary disease or  currently infection or pneumonia, bronchitis. 6.  Diabetes, control with diet and need for further intervention as per primary care.  7.  Possible discharge to home when the patient has significant improvements with above.    ____________________________ Corey Skains, MD bjk:ea D: 01/01/2014 22:25:04 ET T: 01/01/2014 23:13:01 ET JOB#: [id number]  cc: Corey Skains, MD, <Dictator> Corey Skains MD ELECTRONICALLY SIGNED 01/06/2014 13:01

## 2015-01-24 NOTE — Discharge Summary (Signed)
Dates of Admission and Diagnosis:  Date of Admission 24-May-2014   Date of Discharge 01-Jan-0001   Admitting Diagnosis Ac respi failure, Pneumonia   Final Diagnosis Ac respi failure- COPD and pneumonia Ch CHF Htn Hypothyroidism    Chief Complaint/History of Present Illness an 81 year old female who lives at home. The patient has a history of chronic atrial fibrillation with chronic diastolic congestive heart failure and multiple other medical issues. She was hospitalized in March with pneumonia. She presents now with worsening shortness of breath and cough. In the Emergency Room, the patient was noted to be hypoxic, febrile and tachycardic. Chest x-ray revealed pneumonia. She is now admitted for further evaluation.   Allergies:  Klonopin: Hives  Diazepam: Hives  Fish: GI Distress  Shellfish: GI Distress  Zoloft: Other  Vioxx: Other  Celebrex: Other  Levaquin: Other  Avelox: Other  Paxil: Other  Septra: Other  Codeine: Other  Macrobid: Unknown  Moxifloxacin: Unknown  doxazosin: GI Distress  Thyroid:  22-Aug-15 09:32   Thyroid Stimulating Hormone  5.16 (0.45-4.50 (International Unit)  ----------------------- Pregnant patients have  different reference  ranges for TSH:  - - - - - - - - - -  Pregnant, first trimetser:  0.36 - 2.50 uIU/mL)  Hepatic:  22-Aug-15 09:32   Bilirubin, Total  1.2  Alkaline Phosphatase 99 (46-116 NOTE: New Reference Range 04/22/14)  SGPT (ALT) 15 (14-63 NOTE: New Reference Range 04/22/14)  SGOT (AST) 19  Total Protein, Serum 7.0  Albumin, Serum 3.7  Routine Micro:  22-Aug-15 09:32   Micro Text Report BLOOD CULTURE   COMMENT                   NO GROWTH IN 48 HOURS   ANTIBIOTIC                       Micro Text Report BLOOD CULTURE   COMMENT                   NO GROWTH IN 48 HOURS   ANTIBIOTIC                       Culture Comment NO GROWTH IN 48 HOURS  Result(s) reported on 26 May 2014 at 10:00AM.  Culture Comment NO GROWTH IN  48 HOURS  Result(s) reported on 26 May 2014 at 10:00AM.    14:30   Micro Text Report URINE CULTURE   COMMENT                   MIXED BACTERIAL ORGANISMS   COMMENT                   RESULTS SUGGESTIVE OF CONTAMINATION   ANTIBIOTIC                       Specimen Source CC  Culture Comment MIXED BACTERIAL ORGANISMS  Culture Comment . RESULTS SUGGESTIVE OF CONTAMINATION  Result(s) reported on 26 May 2014 at 10:23AM.  Cardiology:  22-Aug-15 11:18   Echo Doppler REASON FOR EXAM:     COMMENTS:     PROCEDURE: [facility] - ECHO DOPPLER COMPLETE(TRANSTHOR)  - May 24 2014 11:18AM   RESULT: Echocardiogram Report  Patient Name:   Tracy Richards Date of Exam: 05/24/2014 Medical Rec #:  [id number]        Custom1: Date ofBirth:  04-15-1929      Height:  60.0 in Patient Age:    66 years      Weight:       148.0 lb Patient Gender: F             BSA:          1.64 m??  Indications: Atrial Fib Sonographer:    Arville Go RDCS Referring Phys: Boris Lown  Sonographer Comments: Suboptimal apical window and Limited.  Summary:  1. Left ventricular ejection fraction, by visual estimation, is 50 to  55%.  2. Low normal global left ventricular systolic function.  3. Mild left ventricular hypertrophy.  4. Decreased left ventricular internal cavity size.  5. Mildly dilated left atrium.  6. Mildly dilated right atrium.  7. Mild mitral valve regurgitation.  8. Mildly elevated pulmonary artery systolic pressure.  9. Mildly increased left ventricular posterior wall thickness. 2D AND M-MODE MEASUREMENTS (normal ranges within parentheses): Left Ventricle:          Normal IVSd (2D):      0.98 cm (0.7-1.1) LVPWd (2D):     1.26 cm (0.7-1.1) Aorta/LA:                  Normal LVIDd (2D): 3.51 cm (3.4-5.7) Aortic Root (2D): 2.60 cm (2.4-3.7) LVIDs (2D):     2.47 cm           Left Atrium (2D): 4.10 cm (1.9-4.0) LV FS (2D):     29.6 %   (>25%) LV EF (2D):     57.7 %   (>50%)                                    Right Ventricle:                             RVd (2D): SPECTRAL DOPPLER ANALYSIS (where applicable): LVOT Vmax: 1.44 m/s LVOT VTI:  LVOT Diameter: 2.10 cm Tricuspid Valve and PA/RV Systolic Pressure: TR Max Velocity: 2.93 m/s RA  Pressure: 10 mmHg RVSP/PASP: 44.68mHg Pulmonic Valve: PV Max Velocity: 1.00 m/s PV Max PG: 4.0 mmHg PV Mean PG: Pulmonic Insufficiency: PI EDV: 0.94 m/s PADP: 13.5 mmHg  PHYSICIAN INTERPRETATION: Left Ventricle: The left ventricular internal cavity size was decreased.  LV posterior wall thickness was mildly increased. Mild left ventricular  hypertrophy. Global LV systolic function was low normal. Left ventricular  ejection fraction, by visual estimation, is 50 to 55%. Right Ventricle: The right ventricular size is normal. Left Atrium: The left atrium is mildly dilated. Right Atrium: The right atrium is mildly dilated. Pericardium: There is no evidence of pericardial effusion. Mitral Valve: Mild mitral valve regurgitation is seen. Tricuspid Valve: The tricuspid regurgitant velocity is 2.93 m/s, and with   an assumed right atrial pressure of 10 mmHg, the estimated right  ventricular systolic pressure is mildly elevated at 44.4 mmHg. Aortic Valve: The aortic valve is normal. Aorta: The aortic root is normal in size and structure.  [email or web address] Electronically signed by [id number] CowmanMD Signature Date/Time: 05/24/2014/12:18:01 PM  *** Final ***  IMPRESSION: .    Verified By:Sheppard Coil. PARASCHOS, M.D., MD  Routine Chem:  22-Aug-15 09:32   Glucose, Serum  145  BUN  6  Creatinine (comp) 0.90  Sodium, Serum  135  Potassium, Serum 3.6  Chloride, Serum 100  CO2, Serum 24  Calcium (Total), Serum 8.6  Anion Gap 11  Osmolality (calc) 270  eGFR (African American) >60  eGFR (Non-African American)  59 (eGFR values <58m/min/1.73 m2 may be an indication of chronic kidney disease (CKD). Calculated eGFR is useful in patients  with stable renal function. The eGFR calculation will not be reliable in acutely ill patients when serum creatinine is changing rapidly. It is not useful in  patients on dialysis. The eGFR calculation may not be applicable to patients at the low and high extremes of body sizes, pregnant women, and vegetarians.)  Magnesium, Serum 1.9 (1.8-2.4 THERAPEUTIC RANGE: 4-7 mg/dL TOXIC: > 10 mg/dL  -----------------------)  Phosphorus, Serum  2.2 (Result(s) reported on 24 May 2014 at 11:08AM.)  Lipase  51 (Result(s) reported on 24 May 2014 at 09:56AM.)  B-Type Natriuretic Peptide ([facility]  4027 (Result(s) reported on 24 May 2014 at 10:09AM.)  25-Aug-15 04:14   Glucose, Serum  226  BUN 10  Creatinine (comp) 0.87  Sodium, Serum 138  Potassium, Serum 4.4  Chloride, Serum 102  CO2, Serum 26  Calcium (Total), Serum 9.0  Anion Gap 10  Osmolality (calc) 282  eGFR (African American) >60  eGFR (Non-African American) >60 (eGFR values <671mmin/1.73 m2 may be an indication of chronic kidney disease (CKD). Calculated eGFR is useful in patients with stable renal function. The eGFR calculation will not be reliable in acutely ill patients when serum creatinine is changing rapidly. It is not useful in  patients on dialysis. The eGFR calculation may not be applicable to patients at the low and high extremes of body sizes, pregnant women, and vegetarians.)  Cardiac:  22-Aug-15 09:32   Troponin I < 0.02 (0.00-0.05 0.05 ng/mL or less: NEGATIVE  Repeat testing in 3-6 hrs  if clinically indicated. >0.05 ng/mL: POTENTIAL  MYOCARDIAL INJURY. Repeat  testing in 3-6 hrs if  clinically indicated. NOTE: An increase or decrease  of 30% or more on serial  testing suggests a  clinically important change)  CK, Total 61 (26-192 NOTE: NEW REFERENCE RANGE  11/04/2013)  CPK-MB, Serum 1.1 (Result(s) reported on 24 May 2014 at 10:09AM.)  Routine Coag:  22-Aug-15 09:32   Prothrombin 14.3  INR 1.1 (INR reference  interval applies to patients on anticoagulant therapy. A single INR therapeutic range for coumarins is not optimal for all indications; however, the suggested range for most indications is 2.0 - 3.0. Exceptions to the INR Reference Range may include: Prosthetic heart valves, acute myocardial infarction, prevention of myocardial infarction, and combinations of aspirin and anticoagulant. The need for a higher or lower target INR must be assessed individually. Reference: The Pharmacology and Management of the Vitamin K  antagonists: the seventh ACCP Conference on Antithrombotic and Thrombolytic Therapy. [id number]:126 (3suppl): [id number] HCT value >55% may artifactually increase the PT.  In one study,  the increase was an average of 25%. Reference:  "Effect on Routine and Special Coagulation Testing Values of Citrate Anticoagulant Adjustment in Patients with High HCT Values." American Journal of Clinical Pathology 2006;126:400-405.)  Routine Hem:  22-Aug-15 09:32   WBC (CBC)  15.1  RBC (CBC) 3.83  Hemoglobin (CBC)  11.2  Hematocrit (CBC) 35.3  Platelet Count (CBC) 199 (Result(s) reported on 24 May 2014 at 09:48AM.)  MCV 92  MCH 29.2  MCHC  31.7  RDW  14.6   PERTINENT RADIOLOGY STUDIES: XRay:    22-Aug-15 10:00, Chest Portable Single View  Chest Portable Single View   REASON FOR EXAM:    sob  COMMENTS:  PROCEDURE: DXR - DXR PORTABLE CHEST SINGLE VIEW  - May 24 2014 10:00AM     CLINICAL DATA:  Short of breath, cough, wheezing    EXAM:  PORTABLE CHEST - 1 VIEW    COMPARISON:  Prior chest x-ray 03/26/2014    FINDINGS:  Stable cardiomegaly. Atherosclerotic calcifications are present in  the transverse aorta. Increased pulmonary vascular congestion now  bordering on mild interstitial edema. Asymmetric patchy opacity in  the left mid lung. Slightly increased bibasilar opacities favored to  reflect small effusions and atelectasis.     IMPRESSION:  1. Asymmetric  patchy opacity in the left mid lung. In the  appropriate clinical setting this could represent early  bronchopneumonia. Slightly asymmetric pulmonaryedema is also a  possibility.  2. Slightly increased pulmonary vascular congestion bordering on  mild interstitial edema suggests superimposed mild/early CHF.  3. Bibasilar opacities favored to reflect a combination of  atelectasis and small layeringpleural effusions. Superimposed  infiltrate is difficult to exclude entirely.  4. Aortic atherosclerosis and cardiomegaly.  Electronically Signed    By: Jacqulynn Cadet M.D.    On: 05/24/2014 10:48         Verified By: Criselda Peaches, M.D.,    23-Aug-15 11:36, Chest PA and Lateral  Chest PA and Lateral   REASON FOR EXAM:    pneumonia  COMMENTS:       PROCEDURE: DXR - DXR CHEST PA (OR AP) AND LATERAL  - May 25 2014 11:36AM     CLINICAL DATA:  Pneumonia and CHF    EXAM:  CHEST  2 VIEW    COMPARISON:  Portable chest x-ray of May 24, 2014    FINDINGS:  The lungs are reasonably well inflated. The interstitial markings  remain increased but have improved slightly since yesterday's study.  The left hemidiaphragm remains obscured consistent with residual  atelectasis or pneumonia. The cardiopericardial silhouette remains  enlarged. The pulmonary vascularity is less engorged today. There is  pleural fluid blunting the posterior costophrenic angles  bilaterally. There is dense calcification in the aortic arch and  descending thoracic aorta. The bonythorax exhibits no acute  abnormality.     IMPRESSION:  There has been slight interval improvement in the appearance of the  pulmonary interstitium consistent with resolving CHF. There remains  density in the left lower lobe worrisome for pneumonia or  atelectasis.    Electronically Signed    By: David  Martinique    On: 05/25/2014 12:21         Verified By: DAVID A. Martinique, M.D., MD  LabUnknown:    22-Aug-15 10:00, Chest  Portable Single View  PACS Image    Pertinent Past History:  Pertinent Past History 1.  Chronic atrial fibrillation.  2.  History of pneumonia.  3.  Chronic diastolic congestive heart failure.  4.  Peripheral vascular disease with carotid bruits.  5.  Osteoarthritis.  6.  Chronic vertigo.  7.  Type 2 diabetes.  8.  Osteoporosis.  9.  Renal artery stenosis.  10.  Anxiety and depression. 11.  Benign hypertension.  12.  Hyperlipidemia.  13.  Hypothyroidism.  14.  Peripheral neuropathy.  15.  Spinal stenosis.   Hospital Course:  Hospital Course 1.  Acute respiratory insufficiency. due to Pneumonia- need supplemental oxygen.    try to taper today, if able to tolerate room air- will d/c to rehab. 2.  Pneumonia. with COPD - Rocephin+ azithromycin-     Give mucomyst and flutter valve. feels little better  today.    Some wheezing- given IV steroids, Advair, Spiriva. better now. 3.  Chronic diastolic congestive heart failure.      stable now, no edema. 4.  Rapid atrial fibrillation. - controled now- appreciated cardio consult. Cardizem+ metoprolol. 5.  Hyponatremia. replaced now. 6.  Type 2 diabetes. glipizide+ Insulin SCC. 7.  Anxiety and depression.  8.  Peripheral vascular disease.  9.  Hypothyroidism. replacement. feels better- will try to taper oxygen and if tolerates room air- d/c to Rehab.   Condition on Discharge Stable   Code Status:  Code Status Full Code   DISCHARGE INSTRUCTIONS HOME MEDS:  Medication Reconciliation: Patient's Home Medications at Discharge:     Medication Instructions  gabapentin 600 mg oral tablet  1 tab(s) orally 3 times a day for neuropathy   meclizine 25 mg oral tablet  1 tab(s) orally 3 times a day, As Needed for dizziness   atorvastatin 40 mg oral tablet  1 tab(s) orally once a day (at bedtime)   aspirin 81 mg oral tablet  1 tab(s) orally once a day   docusate sodium 100 mg oral capsule  1 cap(s) orally 2 times a day, As needed, constipation    tiotropium 18 mcg inhalation capsule  1 cap(s) inhaled once a day   fluticasone nasal 50 mcg/inh nasal spray  2 spray(s) nasal once a day for allergies   pantoprazole 40 mg oral delayed release tablet  1 tab(s) orally every 12 hours   fluticasone-salmeterol 250 mcg-50 mcg inhalation powder  1 puff(s) inhaled 2 times a day   levothyroxine 100 mcg (0.1 mg) oral tablet  1 tab(s) orally once a day   metoprolol tartrate 25 mg oral tablet  1 tab(s) orally 2 times a day   potassium chloride 10 meq oral tablet, extended release  1 tab(s) orally once a day   albuterol 2.5 mg/3 ml (0.083%) inhalation solution  1 vial (3 milliliters) via nebulizer every 6 hours as needed for shortness of breath   alprazolam 0.5 mg oral tablet  1 tab(s) orally 2 times a day as needed  for anxiety, nervousness   diltiazem 120 mg/24 hours oral capsule, extended release  1 cap(s) orally once a day   acetaminophen 325 mg oral tablet  2 tab(s) orally every 4 hours, As needed, mild pain (1-3/10) or temp. greater than 100.4   prednisone 10 mg oral tablet  Start at 60 mg and taper by 10 mg daily until complete   ceftin 500 mg oral tablet  1 tab(s) orally 2 times a day x 4 days   furosemide 20 mg oral tablet  1 tab(s) orally once a day   azithromycin 500 mg oral tablet  1 tab(s) orally once a day x 3 days   benzocaine-menthol topical  1 lozenge orally every 2 hours, As Needed, cough, sore throat , As needed, cough, sore throat    PRESCRIPTIONS: PRINTED AND PLACED ON CHART   Physician's Instructions:  Home Oxygen? Yes   Oxygen delivery at home: 1L  Nasal Cannula   Diet Low Sodium   Diet Consistency Regular Consistency   Activity Limitations As tolerated   Return to Work Not Applicable   Time frame for Follow Up Appointment 1-2 weeks  PMD   Other Comments follow with PMD in 1-2 weeks.   Electronic Signatures: Vaughan Basta (MD)  (Signed 25-Aug-15 14:44)  Authored: ADMISSION DATE AND DIAGNOSIS, CHIEF  COMPLAINT/HPI, Allergies, PERTINENT LABS, PERTINENT RADIOLOGY STUDIES, PERTINENT PAST HISTORY, HOSPITAL COURSE,  DISCHARGE INSTRUCTIONS HOME MEDS, PATIENT INSTRUCTIONS   Last Updated: 25-Aug-15 14:44 by Vaughan Basta (MD)

## 2015-01-24 NOTE — Consult Note (Signed)
PATIENT NAME:  Tracy Richards, PETRON MR#:  [id number] DATE OF BIRTH:  12-11-1928  DATE OF CONSULTATION:  01/13/2014  CONSULTING PHYSICIAN:  Isaias Cowman, MD  PRIMARY CARE PHYSICIAN: Cyndi Bender, PA-C, in [place].   CARDIOLOGIST: Isaias Cowman, MD   CHIEF COMPLAINT: Shortness of breath.   REASON FOR CONSULTATION: Consultation requested for evaluation of chest discomfort.   HISTORY OF PRESENT ILLNESS: The patient is an 81 year old female with recent diagnosis of atrial fibrillation. The patient also has also has a history of hypertension and peripheral vascular disease. She has been in her recent state of health until she presented to [facility] Emergency Room on 01/11/2014 with chief complaint of shortness of breath, cough productive with yellowish sputum, and wheezing. In the Emergency Room, the patient was noted to be hypoxic and was admitted with a diagnosis of pneumonia and with right lung opacity on chest x-ray and COPD flare, treated with intravenous antibiotics, nebulizers, and steroid therapy. Admission labs were notable for a negative troponin. Last evening, the patient apparently experienced an episode of palpitations, which has resolved. Repeat troponin again was negative. The patient currently is chest pain-free.   PAST MEDICAL HISTORY: 1.  Atrial fibrillation.  2.  Hypertension.  3.  Hyperlipidemia.  4.  History of renal artery stenosis.  5.  Peripheral vascular disease.  6.  Peripheral neuropathy.  7.  Hypothyroidism.  8.  Spinal stenosis. 9.  History of carotid artery stenosis.  10.  History of diastolic dysfunction.  11.  COPD. 12.  Type 2 diabetes.  13.  Osteoporosis. 14.  Gastroesophageal reflux disease.   MEDICATIONS: Aspirin 81 mg daily, metoprolol tartrate 25 mg b.i.d., losartan 100 mg daily, diltiazem 240 mg daily, atorvastatin 40 mg daily, torsemide 20 mg daily, Klor-Con 10 mEq daily, gabapentin 600 mg t.i.d., meclizine 25 mg t.i.d. p.r.n., Claritin 10 mg daily,  alprazolam 0.5 mg b.i.d. p.r.n., buspirone 7.5 mg b.i.d., Ventolin p.r.n., fluticasone 50 mcg 2 sprays daily, levothyroxine 75 mcg daily.   SOCIAL HISTORY: The patient currently lives alone. She denies tobacco abuse.   FAMILY HISTORY: No immediate family history of coronary artery disease or myocardial infarction.   REVIEW OF SYSTEMS:    CONSTITUTIONAL: No fever or chills.  EYES: No blurry vision.  EARS: No hearing loss.  RESPIRATORY: Shortness of breath with productive cough and wheezing as described above.  CARDIOVASCULAR: The patient has had palpitations and heart flutter as described above.  GASTROINTESTINAL: No nausea, vomiting, or diarrhea.  GENITOURINARY: No dysuria or hematuria.  ENDOCRINE: No polyuria or polydipsia.  MUSCULOSKELETAL: No arthralgias or myalgias.  NEUROLOGICAL: No focal muscle weakness or numbness.  PSYCHOLOGICAL: No depression or anxiety.   PHYSICAL EXAMINATION: VITAL SIGNS: Blood pressure 143/82, pulse 64, respirations 23, temperature 96.9, pulse oximetry 95%.  HEENT: Pupils equal, reactive to light and accommodation.  NECK: Supple without thyromegaly.  LUNGS: Decreased breath sounds in both bases.  HEART: Normal JVP. Normal PMI. Regular rate and rhythm. Normal S1, S2. No appreciable gallop, murmur, or rub.  ABDOMEN: Soft and nontender. Pulses were intact bilaterally.  MUSCULOSKELETAL: Normal muscle tone.  NEUROLOGIC: The patient is alert and oriented x 3. Motor and sensory both grossly intact.   IMPRESSION: An 81 year old female who presents with signs and symptoms consistent with pneumonia with episode of palpitations last evening, currently asymptomatic. The patient has ruled out for myocardial infarction by CPK, isoenzymes, and troponin.   RECOMMENDATIONS: 1.  Agree with overall current therapy.  2.  Continue aspirin for stroke prevention for now.  3.  Defer full-dose anticoagulation. 4.  Defer further cardiac diagnostics at this time.     ____________________________ Isaias Cowman, MD ap:jcm D: 01/13/2014 16:01:00 ET T: 01/13/2014 16:58:15 ET JOB#: [id number]  cc: Isaias Cowman, MD, <Dictator> Isaias Cowman MD ELECTRONICALLY SIGNED 02/04/2014 12:45

## 2015-01-24 NOTE — H&P (Signed)
PATIENT NAME:  Tracy Richards, Tracy Richards MR#:  [id number] DATE OF BIRTH:  [birth year]  DATE OF ADMISSION:  05/24/2014   REFERRING PHYSICIAN: Sheryl L. Benjaman Lobe, MD  FAMILY PHYSICIAN: Nonlocal.   REASON FOR ADMISSION: Pneumonia.   HISTORY OF PRESENT ILLNESS: The patient is an 81 year old female who lives at home. The patient has a history of chronic atrial fibrillation with chronic diastolic congestive heart failure and multiple other medical issues. She was hospitalized in March with pneumonia. She presents now with worsening shortness of breath and cough. In the Emergency Room, the patient was noted to be hypoxic, febrile and tachycardic. Chest x-ray revealed pneumonia. She is now admitted for further evaluation.   PAST MEDICAL HISTORY:  1.  Chronic atrial fibrillation.  2.  History of pneumonia.  3.  Chronic diastolic congestive heart failure.  4.  Peripheral vascular disease with carotid bruits.  5.  Osteoarthritis.  6.  Chronic vertigo.  7.  Type 2 diabetes.  8.  Osteoporosis.  9.  Renal artery stenosis.  10.  Anxiety and depression. 11.  Benign hypertension.  12.  Hyperlipidemia.  13.  Hypothyroidism.  14.  Peripheral neuropathy.  15.  Spinal stenosis.   MEDICATIONS:  1.  Spiriva 1 capsule inhaled daily.  2.  Klor-Con 10 mEq p.o. b.i.d.  3.  Protonix 40 mg p.o. b.i.d.  4.  Nitrostat 0.4 mg sublingually p.r.n. chest pain.  5.  Lopressor 25 mg p.o. b.i.d.  6.  Meclizine 25 mg p.o. t.i.d. p.r.n.  7.  Synthroid 75 mcg p.o. daily.  8.  Gabapentin 600 mg p.o. t.i.d.  9.  Lasix 20 mg p.o. b.i.d.  10.  Advair 250/50 one puff b.i.d.  11.  Flonase 2 puffs in each nostril daily.  12.  Colace 100 mg p.o. b.i.d.  13.  Cardizem CD 240 mg p.o. daily.  14.  BuSpar 7.5 mg p.o. b.i.d.  15.  Lipitor 40 mg p.o. p.o. at bedtime. 16.  Aspirin 81 mg p.o. daily.  17.  Xanax 0.5 mg p.o. b.i.d.   ALLERGIES: DOXAZOSIN, KLONOPIN, VALIUM, CODEINE, SULFA, PAXIL, ZOLOFT, VIOXX, CELEBREX, LEVAQUIN, AVELOX,  MACROBID AND MOXIFLOXACIN.   SOCIAL HISTORY: Negative for alcohol or tobacco abuse.   FAMILY HISTORY: Positive coronary artery disease and stroke.    REVIEW OF SYSTEMS:  CONSTITUTIONAL: The patient has had a fever, but no change in weight.  EYES: No blurred or double vision. No glaucoma.  ENT: No tinnitus or hearing loss. No nasal discharge or bleeding. No difficulty swallowing.  RESPIRATORY: The patient has had cough. Denies wheezing or hemoptysis. No painful respiration.  CARDIOVASCULAR: No chest pain or palpitations. No syncope.  GASTROINTESTINAL: No diarrhea or abdominal pain.  GENITOURINARY: No dysuria or hematuria. No incontinence.  ENDOCRINE: No polyuria or polydipsia. No heat or cold intolerance.  HEMATOLOGIC: The patient denies anemia, easy bruising or bleeding.  LYMPHATIC: No swollen glands. MUSCULOSKELETAL: The patient has pain in her neck, back, shoulders, knees, or hips. No gout.  NEUROLOGIC: No numbness or migraines. Denies stroke or seizures.  PSYCHOLOGICAL: The patient denies anxiety, insomnia or depression.   PHYSICAL EXAMINATION:  GENERAL: The patient is in no acute distress.  VITAL SIGNS: Currently remarkable for a blood pressure of 167/100 with a heart rate of 146, temperature 100.1, respiratory rate of 26, saturating 95% on 4 L.  HEENT: Normocephalic, atraumatic. Pupils equally round and reactive to light and accommodation. Extraocular movements are intact. Sclerae nonicteric. Conjunctivae are clear. Oropharynx is clear.  NECK: Supple without JVD. No  adenopathy or thyromegaly is noted.  LUNGS: Scattered rhonchi. Basilar rales. No wheezes. There is dullness at the left base.  CARDIAC: Irregularly irregular rhythm. No significant rubs or gallops. PMI is nondisplaced. Chest wall is nontender.  ABDOMEN: Soft, nontender, with normoactive bowel sounds. No organomegaly or masses were appreciated. No hernias or bruits were noted.  EXTREMITIES: Without clubbing, cyanosis or  edema. Pulses were 1+ bilaterally.  SKIN: Warm and dry, without rash or lesions.  NEUROLOGIC: Cranial nerves II through XII are grossly intact. Deep tendon reflexes were symmetric. Motor and sensory exam is nonfocal.  PSYCHIATRIC: The patient is alert and oriented to person, place and time. She is cooperative and used good judgment.   LABORATORY DATA: EKG revealed AFib with rapid ventricular response with no acute ischemic changes. Chest x-ray revealed left-sided infiltrate consistent with pneumonia, pulmonary vascular congestion and mild CHF was also noted. Her white count was 15.1 with a hemoglobin of 11.2. Glucose 145, with a BUN of 6, creatinine 0.9, sodium 135 and a potassium of 3.6. GFR was 59. Troponin was less than 0.02. TSH was 5.16.    ASSESSMENT:  1.  Acute respiratory insufficiency.  2.  Pneumonia.  3.  Chronic diastolic congestive heart failure.  4.  Rapid atrial fibrillation.  5.  Hyponatremia.  6.  Type 2 diabetes.  7.  Anxiety and depression.  8.  Peripheral vascular disease.  9.  Hypothyroidism.   PLAN: The patient will be admitted as a Limited Code according to her wishes to telemetry. She will be maintained on oxygen, DuoNeb SVNs and IV antibiotics. Blood cultures have been sent. Because of her AFib and chronic diastolic congestive heart failure, we will follow serial cardiac enzymes and obtain an echocardiogram. We will bump her dose of Synthroid up and consult cardiology. Follow up routine labs and chest x-ray in the morning. Wean oxygen as tolerated. Will consult speech therapy to assess for possible aspiration. We will also consult physical therapy and clinical social work. Further treatment and evaluation will depend upon the patient's progress.   TOTAL TIME SPENT ON THIS PATIENT: 50 minutes    ____________________________ Leonie Douglas. Doy Hutching, MD jds:MT D: 05/24/2014 11:21:37 ET T: 05/24/2014 12:11:14 ET JOB#: [id number]  cc: Leonie Douglas. Doy Hutching, MD, <Dictator> Jassica Zazueta  Lennice Sites MD ELECTRONICALLY SIGNED 05/24/2014 15:09

## 2015-01-24 NOTE — H&P (Signed)
PATIENT NAME:  Tracy Richards, Tracy Richards MR#:  [id number] DATE OF BIRTH:  January 17, 1929  DATE OF ADMISSION:  09/15/2014  PRIMARY CARE PHYSICIAN: At [facility].   REFERRING EMERGENCY ROOM PHYSICIAN: Dr. Lavonia Drafts.   PRIMARY CARDIOLOGIST: Isaias Cowman, MD   CHIEF COMPLAINT: Fever and shortness of breath.   HISTORY OF PRESENT ILLNESS: An 81 year old female who has history of chronic atrial fibrillation, pneumonia, diastolic congestive heart failure, peripheral vascular disease with carotid bruit, osteoarthritis, chronic vertigo, type 2 diabetes, anxiety and depression, had recurrent respiratory issues in the last 6 months. She was admitted in August the last time over here and was sent to rehabilitation, and after that also had to visit the Emergency Room twice for similar respiratory issues. From rehabilitation, she was sent home and was doing good until 2 or 3 days ago when she started having some cough and shortness of breath and she had yellowish sputum secretions. So decided to come to the Emergency Room today. She was also noted to have 102 degree fever in the Emergency Room and some hypoxia, so started on broad-spectrum antibiotic and given to hospitalist team for further management.   REVIEW OF SYSTEMS: CONSTITUTIONAL: Positive for fever and fatigue and generalized weakness. No weight loss or weight gain. EYES: No blurring, double vision, discharge or redness. EARS, NOSE, THROAT: No tinnitus, ear pain or hearing loss.  RESPIRATORY: The patient has some cough and sputum production and some shortness of breath.  CARDIOVASCULAR: No chest pain, orthopnea, edema, arrhythmia, palpitation.  GASTROINTESTINAL: No nausea, vomiting, diarrhea, abdominal pain.  GENITOURINARY: No dysuria, hematuria, increased frequency.  ENDOCRINE: No heat or cold intolerance. No excessive sweating.  MUSCULOSKELETAL: No pain or swelling in the joints.  NEUROLOGICAL: No numbness, weakness, tremor or vertigo.  PSYCHIATRIC: No  anxiety, insomnia, bipolar disorder.   PAST MEDICAL HISTORY:  1.  Chronic atrial fibrillation.  2.  Chronic diastolic congestive heart failure.  3.  Peripheral vascular disease with carotid bruit.  4.  Osteoarthritis.   5.  Chronic vertigo. 6.  History of pneumonia.  7.  Type 2 diabetes. 8.  Osteoporosis.  9.  Renal artery stenosis.  10.  Anxiety and depression.  11.  Benign hypertension.  12.  Hyperlipidemia.  13.  Hypothyroidism.  14.  Peripheral neuropathy.  15.  Spinal stenosis   SOCIAL HISTORY: No alcohol. No smoking. No illegal drug use. Lives at home.   FAMILY HISTORY: Positive for coronary artery disease and smoking.   HOME MEDICATIONS:  1.  Torsemide 20 mg oral 2 tablets once a day.  2.  Spiriva 18 mcg inhalation once daily. 3.  Potassium chloride 10 mEq once a day.  4.  Pantoprazole 40 mg oral 2 times a day.  5.  Metoprolol 25 mg 2 times a day.  6.  Meclizine 25 mg oral 3 times a day.  7.  Levothyroxine 100 mcg oral once a day.  8.  Gabapentin 600 mg oral 3 times a day.  9.  Fluticasone nasal spray once a day.  10.  Diltiazem 120 mg 24-hour extended release once a day.  11.  Atorvastatin 40 mg oral once a day.  12.  Aspirin 81 mg once a day.  13.  Alprazolam 0.5 mg oral take 1/2 tablet once a day.  14.  Albuterol 3 mL every 6 hours for wheezing and shortness of breath.  15.  Advair 1 puff inhalation 2 times a day.   PHYSICAL EXAMINATION:  VITAL SIGNS: In ER, temperature 102, pulse rate  119, respirations 26. Blood pressure was 147/81 and pulse oximetry 91 with oxygen supplementation of 2 liters.  GENERAL: The patient is fully alert and oriented to time, place and person; slight distress due to respiratory issues. HEAD AND NECK: Atraumatic. Conjunctivae pink. Oral mucosa moist. Neck supple, no JVD. Thyroid nontender.  RESPIRATORY: Bilateral equal air entry. Some wheezing present. No crepitation.  CARDIOVASCULAR: S1, S2 present, regular with no murmur. No  tenderness on local palpation of chest.  ABDOMEN: Soft, nontender. Bowel sounds present. No organomegaly.  SKIN: No acne, rashes or lesions.  MUSCULOSKELETAL: No pain or swelling in the joints  NEUROLOGICAL: Power 5/5 in right upper limb, right lower limb, left upper limb and left lower limb. No tremor or rigidity. Sensation is intact. Follows commands.  PSYCHIATRIC: Does not appear in any acute psychiatric illness at this time.   IMPORTANT LABORATORY RESULTS: Glucose 142, BUN 10, creatinine 0.89, sodium 139, potassium 3.4, chloride 105, CO2 of 26, calcium 8.4. Total protein 6.4, albumin 3.6, bilirubin 0.4, alkaline phosphate 82, SGOT 15 and SGPT 16. Troponin less than 0.02. WBC 8.6, hemoglobin 12.3, platelet count 125,000, MCV is 90. INR is 1. Urinalysis is grossly negative. Lactic acid is 1.1. Chest x-ray, portable: No evidence of acute cardiopulmonary disease.   ASSESSMENT AND PLAN: An 81 year old female with multiple medical problems with chronic atrial fibrillation and diastolic congestive heart failure, pneumonia in the past, diabetes, came to Emergency Room with some cough and sputum production with some shortness of breath for the last few days and had fever, tachycardia and increased respiratory rate in Emergency Room and requiring oxygen supplementation.  1.  Sepsis. The patient meets sepsis criteria because of tachycardia, fever and increased respiratory rate plus requirement of oxygen. This is secondary to acute bronchitis and possibly early pneumonia, which is still not evident on chest x-ray. We will give her IV Rocephin and give her IV steroid, nebulizer treatment, Spiriva and try to get a sputum culture. We will also repeat a chest x-ray tomorrow to compare and check for pneumonia.  2.  Diabetes. As she is on steroids, we will check her blood sugar and cover her with insulin sliding scale coverage.  3.  Hypertension. We will continue home medication, metoprolol.  4.  Hypothyroidism.  Continue levothyroxine.  5.  Hyperlipidemia. Continue atorvastatin.  6.  Chronic atrial fibrillation, is already on Cardizem extended-release tablet. We will continue that in the hospital.   CODE STATUS: Full code.   TOTAL TIME SPENT ON THIS ADMISSION: Fifty minutes.    ____________________________ Ceasar Lund Anselm Jungling, MD vgv:TT D: 09/15/2014 15:54:21 ET T: 09/15/2014 16:24:28 ET JOB#: [id number]  cc: Ceasar Lund. Anselm Jungling, MD, <Dictator> Vaughan Basta MD ELECTRONICALLY SIGNED 09/17/2014 21:44

## 2015-01-24 NOTE — Discharge Summary (Signed)
PATIENT NAME:  Tracy Richards, Tracy Richards MR#:  [id number] DATE OF BIRTH:  06-Nov-1928  DATE OF ADMISSION:  01/11/2014 DATE OF DISCHARGE:  01/15/2014  ADMITTING DIAGNOSIS: Shortness of breath, cough.   DISCHARGE DIAGNOSES: 1.  Shortness of breath and cough due to a combination of pneumonia as well as chronic obstructive pulmonary disease exacerbation, as well as congestive heart failure.  2.  Sepsis, possibly due to pneumonia.  Now her white blood cell count has normalized. No fever.  3.  Acute chronic obstructive pulmonary disease exacerbation.  4.  Acute respiratory failure due to multifactorial issues as above.  5.  Acute on chronic systolic congestive heart failure.  6.  History of atrial fibrillation. Heart rate is stable.  7.  Hypertension.  8.  Hypothyroidism.  9.  Chest pain, felt to be related to her pulmonary symptoms, seen by Cardiology.  10.  History of uterovaginal prolapse.  11.  Peripheral vascular disease.  12.  Depression with anxiety.  13.  Hyperlipidemia.  14.  Idiopathic peripheral neuropathy.  15.  Hypothyroidism.  16.  Spinal stenosis.  17.  Renal artery stenosis.  18.  Osteoporosis.  19.  Carotid artery stenosis.  19.  Status post knee joint replacement.  20.  Vertigo.  21.  Type 2 diabetes.  22.  Gastroesophageal reflux disease.  23.  Status post cataract surgery.  24.  Status post right knee replacement.  25.  Status post appendectomy.  26.  Status post spinal surgery.  27.  Status post thyroidectomy.    PERTINENT LABS AND EVALUATIONS: Admitting glucose 157, BNP was 774, BUN 15, creatinine 0.93, sodium 131, potassium 4.2, chloride 98, CO2 26. Troponin less than 0.02. WBC 15.5, hemoglobin 11.8, platelet count 214. Most recent WBC count on April 12, WBC 11. Echocardiogram of the heart showed EF of 45% to 50%, mildly dilated left atrium, mild mitral valve regurg, mild to moderate tricuspid regurg. Blood cultures x 2: No growth.   HOSPITAL COURSE: Please refer to H and P  done by the admitting physician. The patient is an 81 year old white female with known history of hypertension, hypothyroidism, chronic vertigo, peripheral vascular disease, who was recently hospitalized here and discharged on 04/03 from  [facility] after diagnosis of new-onset AFib, who presented with acute onset of shortness of breath. The patient was admitted for acute respiratory failure, possible sepsis due to pneumonia. The patient was admitted and started on treatment with IV antibiotics. She was also thought to have a possible systolic CHF and was treated with IV Lasix. A repeat echo was done, results are as above. She also was thought to have acute COPD exacerbation and was treated with nebulizers and steroids. She had slow improvement in her symptoms. She does have significant deconditioning and debility and will need further rehab. At this time, she is stable for discharge to a skilled nursing facility where most of her care can be continued there.   DISCHARGE MEDICATIONS: Klor-Con 10 mEq daily, gabapentin 601 tabs p.o. t.i.d., meclizine 25 mg 1 tab p.o. t.i.d. as needed for dizziness, Ventolin 2 puffs q. 4 to 6 p.r.n., buspirone 7.5 mg 1 tab p.o. b.i.d., fluticasone two sprays daily, atorvastatin 40 daily, aspirin 81 mg 1 tab p.o. daily, Claritin 10 daily, levothyroxine 75 mcg daily, diltiazem 240 daily, metoprolol tartrate 25 mg 1 tab p.o. b.i.d., losartan 100 mg 1 tab p.o. daily, alprazolam 0.5/1 tab p.o. b.i.d. as needed, meclizine 12.5 daily, albuterol inhalation q.6 hours with nebulizers as needed, Tylenol 650 q.  4 p.r.n., prednisone taper starting at 60 mg, taper by 10 mg until complete. Nitroglycerin 0.4 sublingual p.r.n., Advair 250/50 one puff b.i.d., Spiriva 18 mcg daily, Lasix 20 mg 1 tab p.o. daily, guaifenesin 600 mg 1 tab p.o. b.i.d., amoxicillin and clavulanic acid 875/125 mg 1 tab p.o. q.12 hours x5 days, Colace 100 mg 1 tab p.o. daily as needed.   DIET: Low sodium, low fat,  low cholesterol, carbohydrate-controlled.   ACTIVITY: As tolerated.   FOLLOWUP:  Follow up with M.D. at the skilled nursing facility in 1 to 2 weeks. Dr. Saralyn Pilar in 2 to 4 weeks.   NOTE: Time spent was 35 minutes on the discharge.    ____________________________ Lafonda Mosses. Posey Pronto, MD shp:dp D: 01/15/2014 14:26:00 ET T: 01/15/2014 14:59:32 ET JOB#: [id number]  cc: Braxtyn Dorff H. Posey Pronto, MD, <Dictator> Alric Seton MD ELECTRONICALLY SIGNED 01/17/2014 13:45

## 2015-01-24 NOTE — Discharge Summary (Signed)
Dates of Admission and Diagnosis:  Date of Admission 31-Dec-2013   Date of Discharge 03-Jan-2014   Admitting Diagnosis Afib   Final Diagnosis 1. Afib with RVR 2. HTN 3. Hypothyroidism 4. COPD 5. Chronic diastolic chf    Chief Complaint/History of Present Illness CHIEF COMPLAINT: Vertigo and tachycardia.   HISTORY OF PRESENT ILLNESS: An 81 year old female who has past medical history of hypertension, hypothyroidism, chronic vertigo, some peripheral vascular disease, and carotid artery blockages in the past, who has been seeing her primary care doctor for vertigo for a long time, and is receiving some injections whenever vertigo is out of control. For the last few days, her vertigo is getting worse. She had episodes of vomiting, room spinning, and somewhat dizziness, with having some shivering sensation in upper stomach and chest intermittently throughout the night. No chest pain, no shortness of breath, but had some sensation of heart racing. No syncopal episode. Went to PMD???s office today and noted to have tachycardia, so sent in for further workup to Emergency Room. In the ER, she was noticed having atrial fibrillation with rapid ventricular response, rate up to 130s and 140s, so she was given injection of Cardizem 15 mg by ER physician, which slowed it down, but remained in irregular rhythm, rate up to 70s and 80s, and in between sometimes going up to 90s and 100s, so given to hospitalist team for further management.   Allergies:  Klonopin: Hives  Diazepam: Hives  Fish: GI Distress  Shellfish: GI Distress  Zoloft: Other  Vioxx: Other  Celebrex: Other  Levaquin: Other  Avelox: Other  Paxil: Other  Septra: Other  Codeine: Other  Macrobid: Unknown  Moxifloxacin: Unknown  Metoprolol: Unknown  doxazosin: GI Distress  Thyroid:  01-Apr-15 02:21   Thyroid Stimulating Hormone  0.214 (0.45-4.50 (International Unit)  ----------------------- Pregnant patients have  different  reference  ranges for TSH:  - - - - - - - - - -  Pregnant, first trimetser:  0.36 - 2.50 uIU/mL)  02-Apr-15 16:36   Thyroxine, Free 1.30 (Result(s) reported on 02 Jan 2014 at 05:40PM.)  General Ref:  02-Apr-15 16:36   Free T3, Serum ========== TEST NAME ==========  ========= RESULTS =========  = REFERENCE RANGE =  FREE T3, SERUM  Triiodothyronine,Free,Serum Triiodothyronine,Free,Serum     [   2.4 pg/mL            ]           2.0-4.4               [facility]     No: [id number]           [address] [place], [place] [address]           Lindon Romp, MD         [phone]   Result(s) reported on 03 Jan 2014 at 08:48AM.  Routine Chem:  01-Apr-15 02:21   Result Comment TROPONIN - RESULTS VERIFIED BY REPEAT TESTING.  - PREV. C/ 12-31-13 $RemoveBe'@[email or web address]$  BY RWW. AJO  Result(s) reported on 01 Jan 2014 at 03:30AM.  Glucose, Serum  124  BUN 9  Creatinine (comp) 0.72  Sodium, Serum 140  Potassium, Serum 3.9  Chloride, Serum  108  CO2, Serum 26  Calcium (Total), Serum  8.4  Anion Gap  6  Osmolality (calc) 280  eGFR (African American) >60  eGFR (Non-African American) >60 (eGFR values <13mL/min/1.73 m2 may be an indication of chronic kidney disease (CKD). Calculated eGFR is useful in  patients with stable renal function. The eGFR calculation will not be reliable in acutely ill patients when serum creatinine is changing rapidly. It is not useful in  patients on dialysis. The eGFR calculation may not be applicable to patients at the low and high extremes of body sizes, pregnant women, and vegetarians.)  Magnesium, Serum 2.1 (1.8-2.4 THERAPEUTIC RANGE: 4-7 mg/dL TOXIC: > 10 mg/dL  -----------------------)  Cardiac:  01-Apr-15 02:21   Troponin I  0.14 (0.00-0.05 0.05 ng/mL or less: NEGATIVE  Repeat testing in 3-6 hrs  if clinically indicated. >0.05 ng/mL: POTENTIAL  MYOCARDIAL INJURY. Repeat  testing in 3-6 hrs if  clinically indicated. NOTE: An increase or  decrease  of 30% or more on serial  testing suggests a  clinically important change)  Routine UA:  01-Apr-15 04:57   Color (UA) Straw  Clarity (UA) Clear  Glucose (UA) 50 mg/dL  Bilirubin (UA) Negative  Ketones (UA) Negative  Specific Gravity (UA) 1.006  Blood (UA) Negative  pH (UA) 7.0  Protein (UA) Negative  Nitrite (UA) Negative  Leukocyte Esterase (UA) Negative (Result(s) reported on 01 Jan 2014 at 05:28AM.)  RBC (UA) NONE SEEN  WBC (UA) <1 /HPF  Bacteria (UA) NONE SEEN  Epithelial Cells (UA) NONE SEEN  Result(s) reported on 01 Jan 2014 at 05:28AM.  Routine Hem:  01-Apr-15 02:21   WBC (CBC) 6.9  RBC (CBC)  3.67  Hemoglobin (CBC)  11.6  Hematocrit (CBC) 35.8  Platelet Count (CBC) 211  MCV 98  MCH 31.5  MCHC 32.3  RDW 13.0  Neutrophil % 66.4  Lymphocyte % 22.9  Monocyte % 8.4  Eosinophil % 1.6  Basophil % 0.7  Neutrophil # 4.6  Lymphocyte # 1.6  Monocyte # 0.6  Eosinophil # 0.1  Basophil # 0.1 (Result(s) reported on 01 Jan 2014 at 02:36AM.)   Pertinent Past History:  Pertinent Past History HTN DM Diastolic chf Neuropathy Spinal stenosis   Hospital Course:  Hospital Course 81 yo female w/ hx of vertigo, HTN, Hypothyroidism, Diabetic Neuropathy, anxiety, came into hospital due to dizziness and not feeling well and noted to be in new onset a. fib.   1. New onset a. fib Rate controlled on cardizem and metoprolol. Ambulated today with no tachycardia. ASA for CVA risk. High fall risk and do niot recommend any anti-coagulation. Discussed with pt and daughter regarding higher risk of intr cranial bleed and than benefit of CVA prevention.  2. Elevated Troponin - likely in the setting of demand ischemia from rapid a. fib.  - no chest pain.  appreciate Cards input and no acute intervention at this time.   3. HTN - cont. Losartan, Cardizem.   4. anxiety - cont. PRN Xanax.   5. Hyperlipidemia - cont. Atorvastatin  6. Hypothyroidism - cont. synthroid.   Lowered dose as TSH was low at 2.15.  7. Neuropathy - cont. Neurontin.  TOday on exam   S1, S2 irregular Lungs CTA Abd-soft, non tender Motor 5/5 in upper and lower extremities  Home health with PT, RN, Aide set up  Time spent on discharge 45 minutes   Condition on Discharge Fair   Code Status:  Code Status Full Code   DISCHARGE INSTRUCTIONS HOME MEDS:  Medication Reconciliation: Patient's Home Medications at Discharge:     Medication Instructions  alprazolam 0.5 mg oral tablet  1 tab(s) orally 2 times a day, As Needed - for Anxiety, Nervousness   klor-con 10 meq oral tablet, extended release  1 tab(s) orally once  a day   gabapentin 600 mg oral tablet  1 tab(s) orally 3 times a day for neuropathy   meclizine 25 mg oral tablet  1 tab(s) orally 3 times a day, As Needed for dizziness   ventolin hfa cfc free 90 mcg/inh inhalation aerosol  2 puff(s) inhaled every 4 to 6 hours, As Needed - for Shortness of Breath or wheezing   buspirone 7.5 mg oral tablet  1 tab(s) orally 2 times a day for anxiety   tramadol 50 mg oral tablet  1-2 tab(s) orally 3 times a day, As Needed - for Pain from shingles   fluticasone nasal 50 mcg/inh nasal spray  2 spray(s) nasal once a day for allergies   atorvastatin 40 mg oral tablet  1 tab(s) orally once a day (at bedtime)   aspirin 81 mg oral tablet  1 tab(s) orally once a day   multivitamin  1 tab(s) orally once a day   os-cal calcium+d3 500 mg-200 intl units oral tablet  1 tab(s) orally 2 times a day   torsemide 20 mg oral tablet  1 tab(s) orally once a day   claritin 10 mg oral tablet  1 tab(s) orally once a day   levothyroxine 75 mcg (0.075 mg) oral tablet  1 tab(s) orally once a day   diltiazem 240 mg/24 hours oral capsule, extended release  1 cap(s) orally once a day   metoprolol tartrate 25 mg oral tablet  1 tab(s) orally 2 times a day    STOP TAKING THE FOLLOWING MEDICATION(S):    felodipine 10 mg oral tablet, extended release: 1 tab(s)  orally once a day for blood pressure losartan 100 mg oral tablet: 1 tab(s) orally once a day for blood pressure  Physician's Instructions:  Home Health? Yes   Home Health Service Physicial Therapy  Nurse  Nurse Aid   Diet Low Sodium   Activity Limitations As tolerated  with cane   Return to Work Not Applicable   Time frame for Follow Up Appointment 1-2 weeks  Dr. Nehemiah Massed   Time frame for Follow Up Appointment 1-2 weeks  PCP   Other Comments Your thyroid medication dose has been reduced.   Electronic Signatures: Joaquim Tolen, Lottie Dawson (MD)  (Signed 03-Apr-15 15:37)  Authored: ADMISSION DATE AND DIAGNOSIS, CHIEF COMPLAINT/HPI, Allergies, PERTINENT LABS, PERTINENT PAST [place], PATIENT INSTRUCTIONS   Last Updated: 03-Apr-15 15:37 by Alba Destine (MD)

## 2015-01-24 NOTE — Consult Note (Signed)
PATIENT NAME:  ARIADNA, SETTER MR#:  [id number] DATE OF BIRTH:  1929/04/03  DATE OF CONSULTATION:  03/19/2014  REFERRING PHYSICIAN:   CONSULTING PHYSICIAN:  Christopher A. Lundquist, MD  REASON FOR CONSULTATION: Bright red blood per rectum, abdominal wall likely spigelian hernia.   HISTORY OF PRESENT ILLNESS: Ms. Bari is a pleasant 81 year old female with history of multiple medical issues including atrial fibrillation, congestive heart failure, chronic obstructive pulmonary disease, diabetes mellitus and peripheral vascular disease, who presents initially with bright red blood per rectum. She began experiencing nausea, vomiting and diarrhea on 06/14, did not have any sick contacts, did not have any unusual ingestions. She was given antibiotics and had been better and was noted to have blood accompanying her stool. She is still having approximately 3 bowel movements today, the last approximately 4 hours before, but her overall diarrhea had resolved approximately 24 hours ago, has no longer had any nausea and vomiting after the initial episodes the first 24 hours. Had a CT scan which showed diverticulosis and then looked what like a spigelian hernia on the left side containing descending colon with possible stranding but minimal. Not having any abdominal pain or tenderness, was initially having some pain when she was having nausea, vomiting and diarrhea.  Not having any further rectal bleeding per her report. No fevers, chills, night sweats, shortness of breath, cough, chest pain, current abdominal pain, current nausea or vomiting. Stool was somewhat loose, no dysuria or hematuria, has had 3 hemoglobins drawn which have been stable, currently it is 11.0.   PAST MEDICAL HISTORY: 1.  History of atrial fibrillation.  2.  Congestive heart failure.  3.  Chronic obstructive pulmonary disease.  4.  Spinal stenosis.  5.  Hypothyroidism.  6.  Peripheral vascular disease.  7.  Peripheral neuropathy.  8.   Diabetes mellitus type 2.  9.  Renal artery stenosis.  10.  Gastroesophageal reflux disease. 11.  History of carotid artery stenosis.  12.  History of osteoporosis.  13.  History of uterovaginal prolapse.  14.  Status post appendectomy.  15.  History of knee replacement, right side.  16.  History of cataract surgeries.  17.  History of spinal surgery.  18.  History of thyroidectomy.   HOME MEDICATIONS: 1.  Aspirin.  2.  Metoprolol.  3.  Losartan.  4.  Diltiazem.  5.  Meclizine.  6.  Claritin.  7.  Gabapentin. 8.  Atorvastatin.  9.  Insulin.  10.  Buspirone.  11.  Fluticasone. 12.  Potassium.  13.  Xanax.  14.  Torsemide.  15.  L-thyroxine.  16.  Advair.     ALLERGIES: AVELOX, CELEBREX, METOPROLOL, CODEINE, DIAZEPAM, DOXAZOSIN, KLONOPIN, LEVAQUIN, MACROBID, MOXIFLOXACIN.   SOCIAL HISTORY: Denies tobacco, alcohol or drug use. She is here with her daughter.   FAMILY HISTORY: History of cancer in her brother, mother with CVA.   REVIEW OF SYSTEMS: A 12-point review of systems was obtained. Pertinent positives and negatives as above.   PHYSICAL EXAMINATION: VITAL SIGNS: Temperature 97.7, pulse 65, blood pressure 122/57, respirations 20, 97% on 2 liters.  GENERAL: No acute distress. Alert and oriented x 3.  HEAD: Normocephalic, atraumatic.  EYES: No scleral icterus. No conjunctivitis.  FACE: No obvious facial trauma. Normal external nose and external ears. CHEST: Lungs clear to auscultation, moving air well.  HEART: Regular rate and rhythm.  No murmurs, rubs or gallops.   ABDOMEN:  Soft, nontender, nondistended.  No obvious hernia palpated on either side.  EXTREMITIES:  Moves all extremities well.  Strength 5 out of 5. NEUROLOGICAL:  Cranial nerves II through XII grossly intact.    LABORATORY, DIAGNOSTIC AND RADIOLOGICAL DATA:   1.  Current creatinine is 1.95, 1.68 on admission, previous in April 2015 was approximately 1 at baseline.  2.  White cell count 8.0, 73%  neutrophils. Hemoglobin 11.0 at 11:00, previously 10.7 and 11.2, platelets 206. LFTs are normal. INR is 1.1.  3.  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   ASSESSMENT AND PLAN: Ms. Carreras is a pleasant 81 year old female who presents with nausea, vomiting, diarrhea and history of right blood per rectum. Initial, nausea, vomiting, diarrhea was consistent with gastroenteritis which has resolved. Hemoglobin is stable, no acute bleeding. Is not tender with the spigelian hernia and, thus, no emergent need for repair or it could be done electively, however, has multiple medical conditions and would likely be morbid. We will continue to follow.    ____________________________ Glena Norfolk Lundquist, MD cal:cs D: 03/19/2014 20:35:13 ET T: 03/19/2014 20:53:07 ET JOB#: [id number]  cc: Harrell Gave A. Lundquist, MD, <Dictator> Floyde Parkins MD ELECTRONICALLY SIGNED 03/25/2014 11:04

## 2015-01-24 NOTE — Consult Note (Signed)
Details:   - GI Note:  I have seen Ms Ashely and agree with Tobe Sos a/p.   Ms Digiulio is reporting block vomit, black stools, and bloody stools to me.   Her Hgb is stable.   I have discussed the possibilty of inpt EGD / Colon vs output clinic. She would like to go ahead with procedures while in hospital.   We will try miralax gatorade prep since she has some nausea and prob wont't tolerate golyte.   procedures will be tomorrow afternoon.   First half  of prep tonight, other half from 8 to 10 am NPO after 10 am. Clear liquid diet until 10 am.   Electronic Signatures: Arther Dames (MD)  (Signed 17-Jun-15 17:36)  Authored: Details   Last Updated: 17-Jun-15 17:36 by Arther Dames (MD)

## 2015-01-24 NOTE — Consult Note (Signed)
Brief Consult Note: Diagnosis: stenosis of the left subclavian artery.   Patient was seen by consultant.   Comments: Patient denies that the left hand or arm have pain or any limitations.  She relates that she has had a stent placed in that location by her cardiologist who is the MD following her PAD.  I do not recommend any further intervention at this time and she should follow up with her cardiologist in January.  Electronic Signatures: Hortencia Pilar (MD)  (Signed 18-Dec-15 18:09)  Authored: Brief Consult Note   Last Updated: 18-Dec-15 18:09 by Hortencia Pilar (MD)

## 2015-01-27 DIAGNOSIS — I5032 Chronic diastolic (congestive) heart failure: Secondary | ICD-10-CM | POA: Diagnosis not present

## 2015-01-27 DIAGNOSIS — J449 Chronic obstructive pulmonary disease, unspecified: Secondary | ICD-10-CM | POA: Diagnosis not present

## 2015-01-27 DIAGNOSIS — F3340 Major depressive disorder, recurrent, in remission, unspecified: Secondary | ICD-10-CM | POA: Diagnosis not present

## 2015-01-27 DIAGNOSIS — J189 Pneumonia, unspecified organism: Secondary | ICD-10-CM | POA: Diagnosis not present

## 2015-01-28 NOTE — Discharge Summary (Signed)
PATIENT NAME:  Tracy Richards, Tracy Richards MR#:  [id number] DATE OF BIRTH:  [birth year]  DATE OF ADMISSION:  09/15/2014 DATE OF DISCHARGE:  09/22/2014  CHIEF COMPLAINT AT TIME OF ADMISSION: Fever and shortness of breath.   ADMITTING DIAGNOSES:  1.  Sepsis with tachycardia, fever and shortness of breath.  2.  Sepsis from acute bronchitis.  3.  Diabetes.  4.  Hypertension.  5.  Hypothyroidism.  6.  Hyperlipidemia.  7.  Chronic atrial fibrillation.   DISCHARGE DIAGNOSES:  1.  Sepsis from acute bronchitis.  2.  Chronic subclavian artery occlusion.  3.  Chronic atrial fibrillation.  4.  Diabetes.  5.  Hypertension.  6.  Hypothyroidism.  7.  Hyperlipidemia.   CONSULTATIONS: Pulmonology and vascular surgery.   PROCEDURES: None.   BRIEF HISTORY AND  PHYSICAL AND HOSPITAL COURSE: The patient is an 81 year old female with a history of chronic atrial fibrillation and diastolic congestive heart failure and cerebrovascular disease who came into the ED with a chief complaint of a fever and shortness of breath. The patient has had recurrent respiratory issues for the past 6 months. During the previous admission, she was sent to rehab and from there to home approximately 2 to 3 days prior to this admission. Please see the history and physical for details. The patient was having shortness of breath and had yellowish phlegm. The patient had 103 degrees fever when she was brought into the ED because of the shortness of breath and fever. The patient was started on broad-spectrum antibiotics, and the hospitalist team was called to admit the patient. Chest x-ray had no evidence of cardiopulmonary disease.   HOSPITAL COURSE:  1.  Sepsis. The patient's sepsis was assumed to be from acute bronchitis. The patient had a fever and tachycardia at the time of admission. She was placed on oxygen because of the hypoxia. She was started on IV Rocephin and Zithromax. IV steroids and nebulizer treatments were started as well. Sputum  cultures were obtained. The patient was seen by pulmonology as the patient has had recurrent infections. Repeat 2-view chest x-ray has revealed COPD with superimposed acute bronchitis but no focal pneumonia or pulmonary edema. She was evaluated by pulmonology because of the fourth episode of acute bronchitis in this year. They have recommended to continue IV Rocephin, azithromycin, IV steroids and nebulizer treatments. Flu panel was negative. CT scan of the chest was done as recommended by pulmonology. Pulmonology has recommended to continue IV Rocephin and Zithromax after reviewing the CT of the chest. The patient has completed IV Rocephin and a 5-day course of azithromycin during the hospital course. CT scan of the chest has revealed a left subclavian artery thrombosis regarding which vascular surgery was consulted. The patient was concerned about her recurrent acute bronchitis episodes. Another chest x-ray was done to make sure the patient is not developing any pneumonia. Repeat chest x-ray has revealed bilateral interstitial changes versus atypical infection. This was discussed with pulmonology who has recommended the patient to take another 7 days of Augmentin. WITH THE PATIENT BEING ALLERGIC TO MOXIFLOXACIN AND LEVOFLOXACIN, WE COULD NOT PUT HER ON MOXIFLOXACIN, LEVOFLOXACIN OR QUINOLONES. The clinical situation significantly improved. The decision was made to discharge the patient home with home health with aide, RN and physical therapy. Sputum culture has revealed gram-positive cocci in clusters and gram-negative coccobacilli.  2.  Subclavian artery occlusion as seen on the CT scan of the chest. This is a left subclavian artery thrombus. Vascular surgery was consulted regarding this. The  patient was evaluated by Dr. Hortencia Pilar who said this is chronic and recommended the patient to follow up with cardiology.  3.  Diabetes mellitus. The patient being on steroids, her Accu-Cheks were high. The patient  was given sliding scale insulin during the hospital course.  4.  Hypertension. Continue home medications.  5.  Hypothyroidism. Continue levothyroxine.  6.  Hyperlipidemia. Continue statin.  7.  Chronic atrial fibrillation. The plan is to continue her home medication of Cardizem.  8.  Deconditioning. Was provided with physical therapy who has recommended to discharge the patient with home health. Case management was consulted, and home health with aide, RN and physical therapy was arranged. Condition being stable, the decision was made to discharge the patient to the daughter's care.  9.  Pulmonary nodules. Repeat CT of the chest as recommended by radiology in 6 months to 1 year.   PHYSICAL EXAMINATION:  GENERAL well developed, not in acute distress.  HEENT: Normocephalic, atraumatic. Pupils are equally reacting to light and accommodation. No scleral icterus. No conjunctival injection. Moist mucous membranes.  NECK: Supple. No JVD.  RESPIRATORY: Not using any accessory muscles. No wheezing. No rhonchi.  CARDIOVASCULAR: Irregularly irregular. No murmurs.  GASTROINTESTINAL: Soft. Bowel sounds are positive in all 4 quadrants. Nontender.  EXTREMITIES: No cyanosis. No clubbing.  SKIN: Warm to touch. Normal turgor which was age appropriate.  NEUROLOGIC: Cranial nerves are grossly intact. Motor and sensory are grossly intact. She follows commands.  PSYCHIATRIC: Awake, alert and oriented x3.  VITAL SIGNS: On December 24, temperature 97.9, pulse 91, respirations 18, blood pressure 154/83, pulse oximetry 94% at rest.   LABORATORY DATA AND IMAGING STUDIES: Repeat chest x-ray PA and lateral views on December 20: Mild bilateral interstitial thickening, likely reflecting an element of chronic interstitial disease with possible superimposed mild interstitial edema versus atypical infection.   CT scan of the chest with contrast: This study was performed on 09/18/2014. Extensive atherosclerotic disease of the  aorta, great vessels and coronary arteries. Questionable occlusion versus subtotal occlusion of the proximal left subclavian artery near the origin. Recommend comparison of the blood pressure between the left and right upper extremities. Enlargement of cardiac silhouette. Emphysematous changes without acute infiltrate. Noncalcified right lung nodule 3 mm in diameter in the right lower lobe. Recommended chest CT in 1 year.   Other significant laboratories and imaging studies: On December 21, glucose was 148, BUN 31, creatinine normal. Sodium, potassium, chloride and CO2 are normal. Anion gap is 8. GFR 59. Serum osmolality and calcium are normal. WBC 7.9, hemoglobin 11.6, hematocrit 35.9, platelets 159,000.   MEDICATIONS AT THE TIME OF DISCHARGE:  1.  Metoprolol 25 mg p.o. 2 times a day.  2.  Torsemide  20 mg 2 tablets p.o. once daily.  3.  Atorvastatin 40 mg p.o. once daily.  4.  Advair 250/50 one puff inhalation 2 times a day.  5.  Spiriva 18 mcg 1 capsule inhalation once daily. 6.  Albuterol inhalation every 6 hours as needed for wheezing or shortness of breath.  7.  Alprazolam 0.5 mg half tablet p.o. once a day at bedtime as needed for insomnia.  8.  Aspirin 81 mg 1 capsule p.o. once daily.  9.  Diltiazem 120 mg 1 capsule p.o. once daily.  10. Fluticasone 2 sprays nasally once daily as needed for allergies.  11. Gabapentin 600 mg 1 tablet p.o. 3 times a day.  12. Levothyroxine 100 mcg p.o. once daily.  13. Meclizine 25 mg 3 times  a day as needed for dizziness.  14. Pantoprazole 40 mg 1 tablet p.o. 2 times a day.  15. Potassium chloride 10 mEq p.o. once daily.  16. Prednisone 10 mg 3 tablets take p.o. 2 times a day for 2 days, then 2 tablets p.o. 2 times a day for 2 days, then 1 tablet p.o. 2 times a day for 2 days and then 1 tablet once daily and discontinue.  17. Guaifenesin 600 mg 1 tablet p.o. 2 times a day.  18. Nystatin 5 mL orally every 12 hours swish and swallow.  19. Augmentin  500/125 one tablet p.o. 2 times a day for 1 more week. 20. Dextromethorphan/guaifenesin 10 mL p.o. every 4 hours as needed for cough.   Discharged the patient home with home health.   DIET: Low sodium, low fat, carbohydrate control.   ACTIVITY: As tolerated.   FOLLOWUP: As tolerated as recommended by home physical therapy. Follow up with primary care physician in 1 to 2 weeks. Follow up with the patient's primary cardiologist in 1 week. Follow up with Dr. Stevenson Clinch in 4 to 6 weeks. Follow up with the outpatient diabetic clinic in a week.   The plan of care was discussed in detail with the patient and her daughter. They both verbalized understanding of the plan.   TOTAL TIME SPENT ON THE DISCHARGE: 45 minutes.    ____________________________ Nicholes Mango, MD ag:JT D: 09/27/2014 19:38:01 ET T: 09/28/2014 07:21:40 ET JOB#: [id number]  cc: Nicholes Mango, MD, <Dictator> Unknown cc  Nicholes Mango MD ELECTRONICALLY SIGNED 10/09/2014 23:07

## 2015-02-01 DIAGNOSIS — M6281 Muscle weakness (generalized): Secondary | ICD-10-CM | POA: Diagnosis not present

## 2015-02-01 DIAGNOSIS — E119 Type 2 diabetes mellitus without complications: Secondary | ICD-10-CM | POA: Diagnosis not present

## 2015-02-01 DIAGNOSIS — J449 Chronic obstructive pulmonary disease, unspecified: Secondary | ICD-10-CM | POA: Diagnosis not present

## 2015-02-01 DIAGNOSIS — I5032 Chronic diastolic (congestive) heart failure: Secondary | ICD-10-CM | POA: Diagnosis not present

## 2015-02-01 DIAGNOSIS — I482 Chronic atrial fibrillation: Secondary | ICD-10-CM | POA: Diagnosis not present

## 2015-02-01 DIAGNOSIS — I739 Peripheral vascular disease, unspecified: Secondary | ICD-10-CM | POA: Diagnosis not present

## 2015-02-01 DIAGNOSIS — J189 Pneumonia, unspecified organism: Secondary | ICD-10-CM | POA: Diagnosis not present

## 2015-02-01 DIAGNOSIS — I4891 Unspecified atrial fibrillation: Secondary | ICD-10-CM | POA: Diagnosis not present

## 2015-02-01 DIAGNOSIS — Z9981 Dependence on supplemental oxygen: Secondary | ICD-10-CM | POA: Diagnosis not present

## 2015-02-01 DIAGNOSIS — E871 Hypo-osmolality and hyponatremia: Secondary | ICD-10-CM | POA: Diagnosis not present

## 2015-02-01 DIAGNOSIS — I509 Heart failure, unspecified: Secondary | ICD-10-CM | POA: Diagnosis not present

## 2015-02-01 NOTE — H&P (Signed)
PATIENT NAME:  Tracy Richards, Tracy Richards MR#:  [id number] DATE OF BIRTH:  09-May-1929  DATE OF ADMISSION:  01/19/2015  PRIMARY CARE PHYSICIAN: In [place].  EMERGENCY ROOM PHYSICIAN: Latina Craver, MD   CHIEF COMPLAINT: Cough, shortness of breath for 3 weeks and worsening 1 week.   HISTORY OF PRESENT ILLNESS: This is an 81 year old Caucasian female with a history of chronic atrial fibrillation and diastolic CHF was sent to ED from home due to the above chief complaint. The patient is alert, awake, oriented, in no acute distress on oxygen. According to the patient's daughter, the patient had a cough, sputum, shortness of breath 3 weeks ago, was treated with antibiotics p.o. for 2 weeks without improvement. The patient's shortness of breath, cough, and sputum has been worsening for the past 1 week. In addition, the patient has a fever, chills, poor oral intake, generalized weakness, headache, and dizziness, but the patient denies any chest pain, palpitation. No orthopnea. No nocturnal dyspnea. No leg edema. The patient denies any other symptoms.   PAST MEDICAL HISTORY: Chronic atrial fibrillation, pneumonia, chronic diastolic CHF, COPD, PVD, osteoarthritis, chronic vertigo, diabetes, osteoporosis, renal artery stenosis, anxiety and depression, hypertension, hyperlipidemia, hypothyroidism, peripheral neuropathy, spinal stenosis.   SOCIAL HISTORY: No smoking or drinking or illicit drugs. Lives at home with her daughter.   FAMILY HISTORY: CAD.   ALLERGIES: AVELOX, CELEBREX, CODEINE, DIAZEPAM, DOXAZOSIN, KLONOPIN, LEVAQUIN, MACROBID, MOXIFLOXACIN, PAXIL, SHELLFISH. SEXUAL. SEPTRA, ZOLOFT, FISH.   HOME MEDICATIONS: The medication reconciliation list is not done yet.    REVIEW OF SYSTEMS:  CONSTITUTIONAL: The patient has a fever, chills, headache, dizziness, weakness, and poor oral intake.  EYES: No double vision, no blurred vision.  EARS, NOSE, AND THROAT: No postnasal drip, slurred speech, or dysphagia.   CARDIOVASCULAR: No chest pain, palpitation, no orthopnea or nocturnal dyspnea. No leg edema.  PULMONARY: Positive for cough, sputum, shortness of breath, but no wheezing or hemoptysis.  GASTROINTESTINAL: No abdominal pain, nausea, vomiting, diarrhea. No melena or bloody stool.  GENITOURINARY: No dysuria, hematuria, or incontinence.  SKIN: No rash or jaundice.  NEUROLOGY: No syncope, loss of consciousness, or seizure.  ENDOCRINE: No polyuria, polydipsia, heat or cold intolerance.  HEMATOLOGY: No easy bruising or bleeding.   PHYSICAL EXAMINATION: VITAL SIGNS: Temperature 100.9, blood pressure 97/66, pulse 106 saturation 86% on room air, and increased to 93% on oxygen by nasal cannula.  GENERAL: The patient is alert, awake, oriented, in no acute distress.  HEENT: Pupils round, equal and reactive to light and accommodation. Moist oral mucosa. Clear oropharynx.  NECK: Supple. No JVD or carotid bruit. No lymphadenopathy. No thyromegaly.  CARDIOVASCULAR: S1 and S2 irregular rate and rhythm, no murmurs or gallop. PULMONARY: Bilateral air entry. Bilateral rhonchi and crackles. No wheezing. No use of accessory muscle to breathe.  ABDOMEN: Soft. No distention or tenderness. No organomegaly. Bowel sounds present.  EXTREMITIES: No edema, clubbing or cyanosis. No calf tenderness. Bilateral pedal pulses present.  SKIN: No rash or jaundice.  NEUROLOGY: Alert and oriented x 3. No focal deficit. Power 5/5. Sensory intact.   LABORATORY DATA: Chest x-ray showed many bilateral subsegmental atelectasis with minimal bilateral pleural effusions. WBC 9.5, hemoglobin 11.2, platelets 170,000, glucose 136, BUN 11, creatinine 0.79. Electrolytes normal. Troponin 0.03. Urinalysis is negative. EKG showed atrial fibrillation at 100 BPM.   IMPRESSIONS: 1. Bilateral pneumonia.  2. Atrial fibrillation with rapid ventricular response.  3. Hypertension.  4. Diabetes.  5. Anemia.  6. Chronic diastolic congestive heart  failure, stable.  PLAN OF TREATMENT: 1. The patient will be admitted to a medical floor She was treated with Zithromax and Rocephin in the Emergency Department. We will continue with antibiotics. Follow up with sputum culture, blood culture, and CBC.  2. For atrial fibrillation with rapid ventricular response, which is possibly due to pneumonia. We will continue the patient's home medications, Lopressor, Cardizem, and aspirin.  3. For hypoxia which is due to pneumonia, I will continue oxygen by nasal cannula and Xopenex.  4. Chronic obstructive pulmonary disease, no exacerbation. Continue the patient's home nebulizer treatments, including Advair and Spiriva.  5. For diabetes, I will start sliding scale.  6. I discussed the patient's condition and the plan of treatment with the patient and the patient's daughter. We will get physical therapy for weakness and possibly need a nursing home placement. I will request a Education officer, museum consult.   TIME SPENT: About 62 minutes    ____________________________ Demetrios Loll, MD qc:mw D: 01/19/2015 15:32:50 ET T: 01/19/2015 15:54:19 ET JOB#: [id number]  cc: Demetrios Loll, MD, <Dictator> Demetrios Loll MD ELECTRONICALLY SIGNED 01/19/2015 17:13

## 2015-02-01 NOTE — Discharge Summary (Signed)
PATIENT NAME:  Tracy Richards, Tracy Richards MR#:  [id number] DATE OF BIRTH:  27-Oct-1928  DATE OF ADMISSION:  01/19/2015 DATE OF DISCHARGE:  01/22/2015  PRIMARY CARE PHYSICIAN: Located in [place], [place]   DISCHARGE DIAGNOSES: 1.  Bilateral pneumonia. 2.  Atrial fibrillation with rapid ventricular response. 3.  Hypertension. 4.  Diabetes. 5.  Chronic diastolic congestive heart failure.   CONDITION: Stable.   CODE STATUS: FULL code.   HOME MEDICATIONS: Please refer to the medication reconciliation list.   The patient's hypertension medication discontinued due to low blood pressure.   DISCHARGE DIET: Low-sodium, low-fat, low-cholesterol, ADA diet, mechanical soft diet.   DISCHARGE ACTIVITY: As tolerated.   FOLLOW-UP CARE: Follow up with PCP within 1 to 2 weeks.   REASON FOR ADMISSION: Cough and shortness of breath for 3 weeks and worsening for 1 week.   HISTORY OF PRESENT ILLNESS: An 81 year old Caucasian female with a history of A-fib and diastolic CHF who was sent to ED due to cough and shortness of breath for 3 weeks and worsening for 1 week. For detailed history and physical examination, please refer to the admission note dictated by me. Chest x-ray showed bilateral atelectasis with minimal pleural effusion. WBC 9.5, hemoglobin 11.2. Electrolytes normal. Troponin 0.03. EKG showed A-fib at 100 bpm.  1.  For bilateral pneumonia, the patient has been treated with Zithromax and Rocephin with oxygen by nasal cannula and nebulizer treatment. The patient's blood culture is negative. The patient's symptoms are getting better. She desat to 85% without oxygen. She need O2 by nasal cannula 2 liters.  We will continue Zithromax for an additional 7 days.  2.  For A-fib with RVR, the patient's heart rate is controlled. The patient was treated with Lopressor and Cardizem and aspirin, but since the patient's blood pressure was low, Lopressor and Cardizem were on hold.  3.  COPD, stable. No exacerbation.  Continue the patient's home medications, Advair and Spiriva and nebulizer.  4.  Diabetes. Has been treated with sliding scale. Blood sugar is controlled.  5.  The has generalized weakness. According to physical therapy evaluation, the patient needs skilled nursing facility placement.   The patient's vital signs are stable. Symptoms have much improved. She is clinically stable and will be discharged to a skilled nursing facility today. I discussed the patient's discharge plan with the patient, nurse and social worker.   TIME SPENT: About 38 minutes.   ____________________________ Demetrios Loll, MD qc:sb D: 01/22/2015 11:22:58 ET T: 01/22/2015 11:38:29 ET JOB#: [id number]  cc: Demetrios Loll, MD, <Dictator> Demetrios Loll MD ELECTRONICALLY SIGNED 01/23/2015 15:19

## 2015-02-05 DIAGNOSIS — J449 Chronic obstructive pulmonary disease, unspecified: Secondary | ICD-10-CM | POA: Diagnosis not present

## 2015-02-05 DIAGNOSIS — I482 Chronic atrial fibrillation: Secondary | ICD-10-CM | POA: Diagnosis not present

## 2015-02-05 DIAGNOSIS — J189 Pneumonia, unspecified organism: Secondary | ICD-10-CM | POA: Diagnosis not present

## 2015-02-05 DIAGNOSIS — I5032 Chronic diastolic (congestive) heart failure: Secondary | ICD-10-CM | POA: Diagnosis not present

## 2015-02-09 DIAGNOSIS — G629 Polyneuropathy, unspecified: Secondary | ICD-10-CM | POA: Diagnosis not present

## 2015-02-09 DIAGNOSIS — I4891 Unspecified atrial fibrillation: Secondary | ICD-10-CM | POA: Diagnosis not present

## 2015-02-09 DIAGNOSIS — Z9981 Dependence on supplemental oxygen: Secondary | ICD-10-CM | POA: Diagnosis not present

## 2015-02-09 DIAGNOSIS — I10 Essential (primary) hypertension: Secondary | ICD-10-CM | POA: Diagnosis not present

## 2015-02-09 DIAGNOSIS — J449 Chronic obstructive pulmonary disease, unspecified: Secondary | ICD-10-CM | POA: Diagnosis not present

## 2015-02-09 DIAGNOSIS — F329 Major depressive disorder, single episode, unspecified: Secondary | ICD-10-CM | POA: Diagnosis not present

## 2015-02-09 DIAGNOSIS — F419 Anxiety disorder, unspecified: Secondary | ICD-10-CM | POA: Diagnosis not present

## 2015-02-09 DIAGNOSIS — I739 Peripheral vascular disease, unspecified: Secondary | ICD-10-CM | POA: Diagnosis not present

## 2015-02-09 DIAGNOSIS — M1990 Unspecified osteoarthritis, unspecified site: Secondary | ICD-10-CM | POA: Diagnosis not present

## 2015-02-09 DIAGNOSIS — R42 Dizziness and giddiness: Secondary | ICD-10-CM | POA: Diagnosis not present

## 2015-02-09 DIAGNOSIS — I509 Heart failure, unspecified: Secondary | ICD-10-CM | POA: Diagnosis not present

## 2015-02-09 DIAGNOSIS — E119 Type 2 diabetes mellitus without complications: Secondary | ICD-10-CM | POA: Diagnosis not present

## 2015-02-09 DIAGNOSIS — J189 Pneumonia, unspecified organism: Secondary | ICD-10-CM | POA: Diagnosis not present

## 2015-02-12 DIAGNOSIS — J449 Chronic obstructive pulmonary disease, unspecified: Secondary | ICD-10-CM | POA: Diagnosis not present

## 2015-02-12 DIAGNOSIS — J189 Pneumonia, unspecified organism: Secondary | ICD-10-CM | POA: Diagnosis not present

## 2015-02-12 DIAGNOSIS — I509 Heart failure, unspecified: Secondary | ICD-10-CM | POA: Diagnosis not present

## 2015-02-12 DIAGNOSIS — I10 Essential (primary) hypertension: Secondary | ICD-10-CM | POA: Diagnosis not present

## 2015-02-12 DIAGNOSIS — I4891 Unspecified atrial fibrillation: Secondary | ICD-10-CM | POA: Diagnosis not present

## 2015-02-12 DIAGNOSIS — E119 Type 2 diabetes mellitus without complications: Secondary | ICD-10-CM | POA: Diagnosis not present

## 2015-02-16 DIAGNOSIS — H8110 Benign paroxysmal vertigo, unspecified ear: Secondary | ICD-10-CM | POA: Diagnosis not present

## 2015-02-16 DIAGNOSIS — J449 Chronic obstructive pulmonary disease, unspecified: Secondary | ICD-10-CM | POA: Diagnosis not present

## 2015-02-16 DIAGNOSIS — Z79899 Other long term (current) drug therapy: Secondary | ICD-10-CM | POA: Diagnosis not present

## 2015-02-16 DIAGNOSIS — E039 Hypothyroidism, unspecified: Secondary | ICD-10-CM | POA: Diagnosis not present

## 2015-02-16 DIAGNOSIS — I4891 Unspecified atrial fibrillation: Secondary | ICD-10-CM | POA: Diagnosis not present

## 2015-02-16 DIAGNOSIS — I10 Essential (primary) hypertension: Secondary | ICD-10-CM | POA: Diagnosis not present

## 2015-02-16 DIAGNOSIS — I509 Heart failure, unspecified: Secondary | ICD-10-CM | POA: Diagnosis not present

## 2015-02-16 DIAGNOSIS — I5032 Chronic diastolic (congestive) heart failure: Secondary | ICD-10-CM | POA: Diagnosis not present

## 2015-02-16 DIAGNOSIS — J189 Pneumonia, unspecified organism: Secondary | ICD-10-CM | POA: Diagnosis not present

## 2015-02-16 DIAGNOSIS — E119 Type 2 diabetes mellitus without complications: Secondary | ICD-10-CM | POA: Diagnosis not present

## 2015-02-17 DIAGNOSIS — I4891 Unspecified atrial fibrillation: Secondary | ICD-10-CM | POA: Diagnosis not present

## 2015-02-17 DIAGNOSIS — I509 Heart failure, unspecified: Secondary | ICD-10-CM | POA: Diagnosis not present

## 2015-02-17 DIAGNOSIS — I10 Essential (primary) hypertension: Secondary | ICD-10-CM | POA: Diagnosis not present

## 2015-02-17 DIAGNOSIS — J449 Chronic obstructive pulmonary disease, unspecified: Secondary | ICD-10-CM | POA: Diagnosis not present

## 2015-02-17 DIAGNOSIS — J189 Pneumonia, unspecified organism: Secondary | ICD-10-CM | POA: Diagnosis not present

## 2015-02-17 DIAGNOSIS — E119 Type 2 diabetes mellitus without complications: Secondary | ICD-10-CM | POA: Diagnosis not present

## 2015-02-18 DIAGNOSIS — I4891 Unspecified atrial fibrillation: Secondary | ICD-10-CM | POA: Diagnosis not present

## 2015-02-18 DIAGNOSIS — J449 Chronic obstructive pulmonary disease, unspecified: Secondary | ICD-10-CM | POA: Diagnosis not present

## 2015-02-18 DIAGNOSIS — E119 Type 2 diabetes mellitus without complications: Secondary | ICD-10-CM | POA: Diagnosis not present

## 2015-02-18 DIAGNOSIS — I10 Essential (primary) hypertension: Secondary | ICD-10-CM | POA: Diagnosis not present

## 2015-02-18 DIAGNOSIS — J189 Pneumonia, unspecified organism: Secondary | ICD-10-CM | POA: Diagnosis not present

## 2015-02-18 DIAGNOSIS — I509 Heart failure, unspecified: Secondary | ICD-10-CM | POA: Diagnosis not present

## 2015-02-19 DIAGNOSIS — I509 Heart failure, unspecified: Secondary | ICD-10-CM | POA: Diagnosis not present

## 2015-02-19 DIAGNOSIS — J449 Chronic obstructive pulmonary disease, unspecified: Secondary | ICD-10-CM | POA: Diagnosis not present

## 2015-02-19 DIAGNOSIS — E119 Type 2 diabetes mellitus without complications: Secondary | ICD-10-CM | POA: Diagnosis not present

## 2015-02-19 DIAGNOSIS — I4891 Unspecified atrial fibrillation: Secondary | ICD-10-CM | POA: Diagnosis not present

## 2015-02-19 DIAGNOSIS — I10 Essential (primary) hypertension: Secondary | ICD-10-CM | POA: Diagnosis not present

## 2015-02-19 DIAGNOSIS — J189 Pneumonia, unspecified organism: Secondary | ICD-10-CM | POA: Diagnosis not present

## 2015-02-20 DIAGNOSIS — I4891 Unspecified atrial fibrillation: Secondary | ICD-10-CM | POA: Diagnosis not present

## 2015-02-20 DIAGNOSIS — J449 Chronic obstructive pulmonary disease, unspecified: Secondary | ICD-10-CM | POA: Diagnosis not present

## 2015-02-20 DIAGNOSIS — J189 Pneumonia, unspecified organism: Secondary | ICD-10-CM | POA: Diagnosis not present

## 2015-02-20 DIAGNOSIS — I10 Essential (primary) hypertension: Secondary | ICD-10-CM | POA: Diagnosis not present

## 2015-02-20 DIAGNOSIS — I509 Heart failure, unspecified: Secondary | ICD-10-CM | POA: Diagnosis not present

## 2015-02-20 DIAGNOSIS — E119 Type 2 diabetes mellitus without complications: Secondary | ICD-10-CM | POA: Diagnosis not present

## 2015-02-23 DIAGNOSIS — E119 Type 2 diabetes mellitus without complications: Secondary | ICD-10-CM | POA: Diagnosis not present

## 2015-02-23 DIAGNOSIS — I4891 Unspecified atrial fibrillation: Secondary | ICD-10-CM | POA: Diagnosis not present

## 2015-02-23 DIAGNOSIS — I10 Essential (primary) hypertension: Secondary | ICD-10-CM | POA: Diagnosis not present

## 2015-02-23 DIAGNOSIS — I509 Heart failure, unspecified: Secondary | ICD-10-CM | POA: Diagnosis not present

## 2015-02-23 DIAGNOSIS — J189 Pneumonia, unspecified organism: Secondary | ICD-10-CM | POA: Diagnosis not present

## 2015-02-23 DIAGNOSIS — J449 Chronic obstructive pulmonary disease, unspecified: Secondary | ICD-10-CM | POA: Diagnosis not present

## 2015-02-24 DIAGNOSIS — I509 Heart failure, unspecified: Secondary | ICD-10-CM | POA: Diagnosis not present

## 2015-02-24 DIAGNOSIS — E119 Type 2 diabetes mellitus without complications: Secondary | ICD-10-CM | POA: Diagnosis not present

## 2015-02-24 DIAGNOSIS — I4891 Unspecified atrial fibrillation: Secondary | ICD-10-CM | POA: Diagnosis not present

## 2015-02-24 DIAGNOSIS — I10 Essential (primary) hypertension: Secondary | ICD-10-CM | POA: Diagnosis not present

## 2015-02-24 DIAGNOSIS — J189 Pneumonia, unspecified organism: Secondary | ICD-10-CM | POA: Diagnosis not present

## 2015-02-24 DIAGNOSIS — J449 Chronic obstructive pulmonary disease, unspecified: Secondary | ICD-10-CM | POA: Diagnosis not present

## 2015-02-25 DIAGNOSIS — I10 Essential (primary) hypertension: Secondary | ICD-10-CM | POA: Diagnosis not present

## 2015-02-25 DIAGNOSIS — I509 Heart failure, unspecified: Secondary | ICD-10-CM | POA: Diagnosis not present

## 2015-02-25 DIAGNOSIS — E119 Type 2 diabetes mellitus without complications: Secondary | ICD-10-CM | POA: Diagnosis not present

## 2015-02-25 DIAGNOSIS — I4891 Unspecified atrial fibrillation: Secondary | ICD-10-CM | POA: Diagnosis not present

## 2015-02-25 DIAGNOSIS — J449 Chronic obstructive pulmonary disease, unspecified: Secondary | ICD-10-CM | POA: Diagnosis not present

## 2015-02-25 DIAGNOSIS — J189 Pneumonia, unspecified organism: Secondary | ICD-10-CM | POA: Diagnosis not present

## 2015-02-26 DIAGNOSIS — I509 Heart failure, unspecified: Secondary | ICD-10-CM | POA: Diagnosis not present

## 2015-02-26 DIAGNOSIS — E119 Type 2 diabetes mellitus without complications: Secondary | ICD-10-CM | POA: Diagnosis not present

## 2015-02-26 DIAGNOSIS — I10 Essential (primary) hypertension: Secondary | ICD-10-CM | POA: Diagnosis not present

## 2015-02-26 DIAGNOSIS — J449 Chronic obstructive pulmonary disease, unspecified: Secondary | ICD-10-CM | POA: Diagnosis not present

## 2015-02-26 DIAGNOSIS — J189 Pneumonia, unspecified organism: Secondary | ICD-10-CM | POA: Diagnosis not present

## 2015-02-26 DIAGNOSIS — I4891 Unspecified atrial fibrillation: Secondary | ICD-10-CM | POA: Diagnosis not present

## 2015-02-27 DIAGNOSIS — I10 Essential (primary) hypertension: Secondary | ICD-10-CM | POA: Diagnosis not present

## 2015-02-27 DIAGNOSIS — J449 Chronic obstructive pulmonary disease, unspecified: Secondary | ICD-10-CM | POA: Diagnosis not present

## 2015-02-27 DIAGNOSIS — J189 Pneumonia, unspecified organism: Secondary | ICD-10-CM | POA: Diagnosis not present

## 2015-02-27 DIAGNOSIS — I4891 Unspecified atrial fibrillation: Secondary | ICD-10-CM | POA: Diagnosis not present

## 2015-02-27 DIAGNOSIS — I509 Heart failure, unspecified: Secondary | ICD-10-CM | POA: Diagnosis not present

## 2015-02-27 DIAGNOSIS — E119 Type 2 diabetes mellitus without complications: Secondary | ICD-10-CM | POA: Diagnosis not present

## 2015-03-02 DIAGNOSIS — I10 Essential (primary) hypertension: Secondary | ICD-10-CM | POA: Diagnosis not present

## 2015-03-02 DIAGNOSIS — E119 Type 2 diabetes mellitus without complications: Secondary | ICD-10-CM | POA: Diagnosis not present

## 2015-03-02 DIAGNOSIS — J449 Chronic obstructive pulmonary disease, unspecified: Secondary | ICD-10-CM | POA: Diagnosis not present

## 2015-03-02 DIAGNOSIS — I4891 Unspecified atrial fibrillation: Secondary | ICD-10-CM | POA: Diagnosis not present

## 2015-03-02 DIAGNOSIS — I509 Heart failure, unspecified: Secondary | ICD-10-CM | POA: Diagnosis not present

## 2015-03-02 DIAGNOSIS — J189 Pneumonia, unspecified organism: Secondary | ICD-10-CM | POA: Diagnosis not present

## 2015-03-03 DIAGNOSIS — I4891 Unspecified atrial fibrillation: Secondary | ICD-10-CM | POA: Diagnosis not present

## 2015-03-03 DIAGNOSIS — J449 Chronic obstructive pulmonary disease, unspecified: Secondary | ICD-10-CM | POA: Diagnosis not present

## 2015-03-03 DIAGNOSIS — I509 Heart failure, unspecified: Secondary | ICD-10-CM | POA: Diagnosis not present

## 2015-03-03 DIAGNOSIS — E119 Type 2 diabetes mellitus without complications: Secondary | ICD-10-CM | POA: Diagnosis not present

## 2015-03-03 DIAGNOSIS — I10 Essential (primary) hypertension: Secondary | ICD-10-CM | POA: Diagnosis not present

## 2015-03-03 DIAGNOSIS — J189 Pneumonia, unspecified organism: Secondary | ICD-10-CM | POA: Diagnosis not present

## 2015-03-04 DIAGNOSIS — I509 Heart failure, unspecified: Secondary | ICD-10-CM | POA: Diagnosis not present

## 2015-03-04 DIAGNOSIS — E119 Type 2 diabetes mellitus without complications: Secondary | ICD-10-CM | POA: Diagnosis not present

## 2015-03-04 DIAGNOSIS — I4891 Unspecified atrial fibrillation: Secondary | ICD-10-CM | POA: Diagnosis not present

## 2015-03-04 DIAGNOSIS — J449 Chronic obstructive pulmonary disease, unspecified: Secondary | ICD-10-CM | POA: Diagnosis not present

## 2015-03-04 DIAGNOSIS — I10 Essential (primary) hypertension: Secondary | ICD-10-CM | POA: Diagnosis not present

## 2015-03-04 DIAGNOSIS — J189 Pneumonia, unspecified organism: Secondary | ICD-10-CM | POA: Diagnosis not present

## 2015-03-05 DIAGNOSIS — I10 Essential (primary) hypertension: Secondary | ICD-10-CM | POA: Diagnosis not present

## 2015-03-05 DIAGNOSIS — I509 Heart failure, unspecified: Secondary | ICD-10-CM | POA: Diagnosis not present

## 2015-03-05 DIAGNOSIS — J449 Chronic obstructive pulmonary disease, unspecified: Secondary | ICD-10-CM | POA: Diagnosis not present

## 2015-03-05 DIAGNOSIS — I4891 Unspecified atrial fibrillation: Secondary | ICD-10-CM | POA: Diagnosis not present

## 2015-03-05 DIAGNOSIS — E119 Type 2 diabetes mellitus without complications: Secondary | ICD-10-CM | POA: Diagnosis not present

## 2015-03-05 DIAGNOSIS — J189 Pneumonia, unspecified organism: Secondary | ICD-10-CM | POA: Diagnosis not present

## 2015-03-06 ENCOUNTER — Ambulatory Visit: Payer: Medicare Other | Admitting: Internal Medicine

## 2015-03-09 DIAGNOSIS — I482 Chronic atrial fibrillation: Secondary | ICD-10-CM | POA: Diagnosis not present

## 2015-03-09 DIAGNOSIS — J449 Chronic obstructive pulmonary disease, unspecified: Secondary | ICD-10-CM | POA: Diagnosis not present

## 2015-03-09 DIAGNOSIS — I10 Essential (primary) hypertension: Secondary | ICD-10-CM | POA: Diagnosis not present

## 2015-03-09 DIAGNOSIS — I748 Embolism and thrombosis of other arteries: Secondary | ICD-10-CM | POA: Diagnosis not present

## 2015-03-10 DIAGNOSIS — I4891 Unspecified atrial fibrillation: Secondary | ICD-10-CM | POA: Diagnosis not present

## 2015-03-10 DIAGNOSIS — J449 Chronic obstructive pulmonary disease, unspecified: Secondary | ICD-10-CM | POA: Diagnosis not present

## 2015-03-10 DIAGNOSIS — I509 Heart failure, unspecified: Secondary | ICD-10-CM | POA: Diagnosis not present

## 2015-03-10 DIAGNOSIS — J189 Pneumonia, unspecified organism: Secondary | ICD-10-CM | POA: Diagnosis not present

## 2015-03-10 DIAGNOSIS — E119 Type 2 diabetes mellitus without complications: Secondary | ICD-10-CM | POA: Diagnosis not present

## 2015-03-10 DIAGNOSIS — I10 Essential (primary) hypertension: Secondary | ICD-10-CM | POA: Diagnosis not present

## 2015-03-11 DIAGNOSIS — J189 Pneumonia, unspecified organism: Secondary | ICD-10-CM | POA: Diagnosis not present

## 2015-03-11 DIAGNOSIS — I509 Heart failure, unspecified: Secondary | ICD-10-CM | POA: Diagnosis not present

## 2015-03-11 DIAGNOSIS — I4891 Unspecified atrial fibrillation: Secondary | ICD-10-CM | POA: Diagnosis not present

## 2015-03-11 DIAGNOSIS — I10 Essential (primary) hypertension: Secondary | ICD-10-CM | POA: Diagnosis not present

## 2015-03-11 DIAGNOSIS — J449 Chronic obstructive pulmonary disease, unspecified: Secondary | ICD-10-CM | POA: Diagnosis not present

## 2015-03-11 DIAGNOSIS — E119 Type 2 diabetes mellitus without complications: Secondary | ICD-10-CM | POA: Diagnosis not present

## 2015-03-12 ENCOUNTER — Encounter: Payer: Self-pay | Admitting: Internal Medicine

## 2015-03-12 ENCOUNTER — Other Ambulatory Visit (INDEPENDENT_AMBULATORY_CARE_PROVIDER_SITE_OTHER): Payer: Medicare Other

## 2015-03-12 ENCOUNTER — Ambulatory Visit (INDEPENDENT_AMBULATORY_CARE_PROVIDER_SITE_OTHER): Payer: Medicare Other | Admitting: Internal Medicine

## 2015-03-12 ENCOUNTER — Ambulatory Visit (INDEPENDENT_AMBULATORY_CARE_PROVIDER_SITE_OTHER)
Admission: RE | Admit: 2015-03-12 | Discharge: 2015-03-12 | Disposition: A | Discharge status: Home / Self Care | Payer: Medicare Other | Source: Ambulatory Visit | Attending: Internal Medicine | Admitting: Internal Medicine

## 2015-03-12 ENCOUNTER — Telehealth: Payer: Self-pay | Admitting: Internal Medicine

## 2015-03-12 VITALS — BP 138/68 | HR 71 | Ht 60.0 in | Wt 142.0 lb

## 2015-03-12 DIAGNOSIS — R0600 Dyspnea, unspecified: Secondary | ICD-10-CM

## 2015-03-12 DIAGNOSIS — J449 Chronic obstructive pulmonary disease, unspecified: Secondary | ICD-10-CM | POA: Diagnosis not present

## 2015-03-12 DIAGNOSIS — R0602 Shortness of breath: Secondary | ICD-10-CM | POA: Diagnosis not present

## 2015-03-12 DIAGNOSIS — J9611 Chronic respiratory failure with hypoxia: Secondary | ICD-10-CM

## 2015-03-12 DIAGNOSIS — E038 Other specified hypothyroidism: Secondary | ICD-10-CM | POA: Diagnosis not present

## 2015-03-12 LAB — CBC WITH DIFFERENTIAL/PLATELET
BASOS ABS: 0 10*3/uL (ref 0.0–0.1)
Basophils Relative: 0.5 % (ref 0.0–3.0)
EOS ABS: 0.3 10*3/uL (ref 0.0–0.7)
Eosinophils Relative: 4.3 % (ref 0.0–5.0)
HCT: 34.2 % — ABNORMAL LOW (ref 36.0–46.0)
HEMOGLOBIN: 11.4 g/dL — AB (ref 12.0–15.0)
LYMPHS ABS: 1.5 10*3/uL (ref 0.7–4.0)
Lymphocytes Relative: 21.1 % (ref 12.0–46.0)
MCHC: 33.4 g/dL (ref 30.0–36.0)
MCV: 88.8 fl (ref 78.0–100.0)
Monocytes Absolute: 0.7 10*3/uL (ref 0.1–1.0)
Monocytes Relative: 9.4 % (ref 3.0–12.0)
NEUTROS ABS: 4.5 10*3/uL (ref 1.4–7.7)
Neutrophils Relative %: 64.7 % (ref 43.0–77.0)
PLATELETS: 229 10*3/uL (ref 150.0–400.0)
RBC: 3.85 Mil/uL — AB (ref 3.87–5.11)
RDW: 15.4 % (ref 11.5–15.5)
WBC: 6.9 10*3/uL (ref 4.0–10.5)

## 2015-03-12 LAB — TSH: TSH: 1.47 u[IU]/mL (ref 0.35–4.50)

## 2015-03-12 LAB — BASIC METABOLIC PANEL
BUN: 14 mg/dL (ref 6–23)
CALCIUM: 10.1 mg/dL (ref 8.4–10.5)
CHLORIDE: 101 meq/L (ref 96–112)
CO2: 31 mEq/L (ref 19–32)
Creatinine, Ser: 0.86 mg/dL (ref 0.40–1.20)
GFR: 66.81 mL/min (ref >60.00)
GLUCOSE: 123 mg/dL — AB (ref 70–99)
Potassium: 4.1 mEq/L (ref 3.5–5.1)
Sodium: 138 mEq/L (ref 135–145)

## 2015-03-12 LAB — BRAIN NATRIURETIC PEPTIDE: Pro B Natriuretic peptide (BNP): 906 pg/mL — ABNORMAL HIGH (ref 0.0–100.0)

## 2015-03-12 NOTE — Telephone Encounter (Signed)
Pt was being called to be given CXR results. Spoke with grandson. He was given CXR results. Nothing further needed at this time.

## 2015-03-12 NOTE — Progress Notes (Signed)
Quick Note:  LMTCB ______ 

## 2015-03-12 NOTE — Patient Instructions (Addendum)
No need for 02 at rest or with slow walking around the house but always wear it with exertion and bedtime for now  Stop advair  Please remember to go to the lab and x-ray department downstairs for your tests - we will call you with the results when they are available.    See Tammy NP in 1 week  with all your medications, even over the counter meds, separated in two separate bags, the ones you take no matter what vs the ones you stop once you feel better and take only as needed when you feel you need them.   Tammy  will generate for you a new user friendly medication calendar that will put [place] all on the same page re: your medication use.     Without this process, it simply isn't possible to assure that we are providing  your outpatient care  with  the attention to detail we feel you deserve.   If we cannot assure that you're getting that kind of care,  then we cannot manage your problem effectively from this clinic.  Once you have seen Tammy and we are sure that we're all on the same page with your medication use she will arrange follow up with me.

## 2015-03-12 NOTE — Progress Notes (Signed)
Subjective:    Patient ID: Tracy Richards, female    DOB: 13-Sep-1929 MRN: [id number]   Brief patient profile:  43 yowf quit smoking around 1974  At wt around 130 with no respiratory problems until sinus problems starting in her 70's referred 03/18/2013 to pulmonary clinic by Cyndi Bender for refractory cough and wheeze despite rx with symbiocort and spiriva   History of Present Illness  03/18/2013 1st pulmonary cc nasal congestion assoc with cough indolent onset, persistent x 2 years present 24 h per day but does does not bring up any sign mucus, best with prednisone but "it doesn't last".  Apparently not able to take muliple meds (see allergies). Assoc with subj wheeze and mild doe, does not respond to saba. rec Stop atenolol and prilosec and spiriva and symbicort  Only use the albuterol  if you can't catch your breath. Bystolic 5 mg daily and if blood pressure is too high take it twice daily Pantoprazole (protonix) 40 mg   Take 30-60 min before first meal of the day and Pepcid 20 mg one bedtime until return to office - this is the best way to tell whether stomach acid is contributing to your problem.   GERD (REFLUX) diet       04/01/2013 f/u ov/Raden [place] did not bring pillbox but did bring most meds and seems to know them well Chief Complaint  Patient presents with  . Acute Visit    Chills yesterday with night sweats and weakness in legs.  Cough has improved - prod at times with yellow mucus, some wheezing, and SOB unchanged.  No chest tightness or chest pain.  breathing is no worse off all inhalers - does have saba but not using rec Increase bystolic to 10 mg daily   DATE OF ADMISSION: 01/19/2015 DATE OF DISCHARGE: 01/22/2015  PRIMARY CARE PHYSICIAN: Located in [place], [place]: 1. Bilateral pneumonia. 2. Atrial fibrillation with rapid ventricular response. 3. Hypertension. 4. Diabetes. 5. Chronic diastolic congestive heart failure.    CONDITION: Stable.   CODE STATUS: FULL code.   HOME MEDICATIONS: Please refer to the medication reconciliation list.   The patient's hypertension medication discontinued due to low blood pressure.   DISCHARGE DIET: Low-sodium, low-fat, low-cholesterol, ADA diet, mechanical soft diet.   DISCHARGE ACTIVITY: As tolerated.   FOLLOW-UP CARE: Follow up with PCP within 1 to 2 weeks.   REASON FOR ADMISSION: Cough and shortness of breath for 3 weeks and worsening for 1 week.   HISTORY OF PRESENT ILLNESS: An 81 year old Caucasian female with a history of A-fib and diastolic CHF who was sent to ED due to cough and shortness of breath for 3 weeks and worsening for 1 week. For detailed history and physical examination, please refer to the admission note dictated by me. Chest x-ray showed bilateral atelectasis with minimal pleural effusion. WBC 9.5, hemoglobin 11.2. Electrolytes normal. Troponin 0.03. EKG showed A-fib at 100 bpm.  1. For bilateral pneumonia, the patient has been treated with Zithromax and Rocephin with oxygen by nasal cannula and nebulizer treatment. The patient's blood culture is negative. The patient's symptoms are getting better. She desat to 85% without oxygen. She need O2 by nasal cannula 2 liters. We will continue Zithromax for an additional 7 days.  2. For A-fib with RVR, the patient's heart rate is controlled. The patient was treated with Lopressor and Cardizem and aspirin, but since the patient's blood pressure was low, Lopressor and Cardizem were on hold.  3. COPD,  stable. No exacerbation. Continue the patient's home medications, Advair and Spiriva and nebulizer.  4. Diabetes. Has been treated with sliding scale. Blood sugar is controlled.  5. The has generalized weakness. According to physical therapy evaluation, the patient needs skilled nursing facility placement.    03/12/2015 post hosp f/u ov/Kidus Delman re: ? "Do I need all this medication"/ 02 etc   Chief Complaint  Patient presents with  . Follow-up    Pt c/o sob with exertion, occasional chest tightness.    not using neb at all  Very hoarse/ sleeping ok      No obvious daytime variabilty or assoc excess/ purulent mucus/  cp or chest tightness, subjective wheeze overt sinus or hb symptoms. No unusual exp hx or h/o childhood pna/ asthma or knowledge of premature birth.    Also denies any obvious fluctuation of symptoms with weather or environmental changes or other aggravating or alleviating factors except as outlined above   Current Medications, Allergies, Past Medical History, Past Surgical History, Family History, and Social History were reviewed in [employer] record.  ROS  The following are not active complaints unless bolded sore throat, dysphagia, dental problems, itching, sneezing,  nasal congestion or excess/ purulent secretions, ear ache,   fever, chills, sweats, unintended wt loss, pleuritic or exertional cp, hemoptysis,  orthopnea pnd or leg swelling, presyncope, palpitations, heartburn, abdominal pain, anorexia, nausea, vomiting, diarrhea  or change in bowel or urinary habits, change in stools or urine, dysuria,hematuria,  rash, arthralgias, visual complaints, headache, numbness weakness or ataxia or problems with walking or coordination,  change in mood/affect or memory.           Objective:   Physical Exam  amb frail wf nad   04/01/2013       169  > 03/12/2015    142  Wt Readings from Last 3 Encounters:  03/18/13 172 lb (78.019 kg)    HEENT mild turbinate edema.  Dentures top/ Oropharynx no thrush or excess pnd or cobblestoning.  No JVD or cervical adenopathy. Mild accessory muscle hypertrophy. Trachea midline, nl thryroid. Chest was hyperinflated by percussion with diminished breath sounds and moderate increased exp time without wheeze. Hoover sign positive at mid inspiration. Regular rate and rhythm without murmur gallop or rub or increase P2  or edema.  Abd: no hsm, nl excursion. Ext warm without cyanosis or clubbing.       CXR PA and Lateral:   03/12/2015 :     I personally reviewed images and agree with radiology impression as follows:     Stable cardiomegaly and COPD. Stable coarsening of interstitial lung markings, likely chronic. No active disease.   Labs ordered/ reviewed:    Lab 03/12/15 1212  NA 138  K 4.1  CL 101  CO2 31  BUN 14  CREATININE 0.86  GLUCOSE 123*       Lab 03/12/15 1212  HGB 11.4*  HCT 34.2*  WBC 6.9  PLT 229.0     Lab Results  Component Value Date   TSH 1.47 03/12/2015     Lab Results  Component Value Date   PROBNP 906.0* 03/12/2015        Assessment & Plan:

## 2015-03-13 ENCOUNTER — Encounter: Payer: Self-pay | Admitting: Internal Medicine

## 2015-03-13 DIAGNOSIS — J9611 Chronic respiratory failure with hypoxia: Secondary | ICD-10-CM | POA: Insufficient documentation

## 2015-03-13 NOTE — Assessment & Plan Note (Signed)
rec no need for 02 at rest or room to room/ wear only at hs and with ex at 2lpm for now

## 2015-03-13 NOTE — Progress Notes (Signed)
Quick Note:  Spoke with pt and notified of results per Dr. Wert. Pt verbalized understanding and denied any questions.  ______ 

## 2015-03-13 NOTE — Assessment & Plan Note (Addendum)
-   03/12/15 unable to do spirometry - 03/12/2015   Walked RA x one lap @ 185 stopped due to weak/dizzy, no sob or desat   I had an extended discussion with the patient reviewing all relevant studies completed to date and  lasting 15 to 20 minutes of a 25 minute visit on the following ongoing concerns:   1) not clear at all she has any copd but clear no AB so try off advair  2) may have component of diast chf/ cardiac asthma with elevated bnp but reluctant to change meds further until clarify exactly what she's takng   3)  very marginal in terms of insight into meds/ desperately needs med reconciliation.  To keep things simple, I have asked the patient to first separate medicines that are perceived as maintenance, that is to be taken daily "no matter what", from those medicines that are taken on only on an as-needed basis and I have given the patient examples of both, and then return to see our NP to generate a  detailed  medication calendar which should be followed until the next physician sees the patient and updates it.    4) See instructions for specific recommendations which were reviewed directly with the patient who was given a copy with highlighter outlining the key components.

## 2015-03-13 NOTE — Assessment & Plan Note (Signed)
Lab Results  Component Value Date   TSH 1.47 03/12/2015     Adequate control on present rx, reviewed > no change in rx needed

## 2015-03-16 ENCOUNTER — Telehealth: Payer: Self-pay | Admitting: *Deleted

## 2015-03-16 MED ORDER — TIOTROPIUM BROMIDE MONOHYDRATE 2.5 MCG/ACT IN AERS: 2.0000 | INHALATION_SPRAY | Freq: Every day | RESPIRATORY_TRACT | Status: DC

## 2015-03-16 MED ORDER — FLUTICASONE-SALMETEROL 250-50 MCG/DOSE IN AEPB: 1.0000 | INHALATION_SPRAY | Freq: Two times a day (BID) | RESPIRATORY_TRACT | Status: AC

## 2015-03-16 NOTE — Telephone Encounter (Signed)
Spoke with Horris Latino.  She says that patient has been taught how to use Respimat and has been using it for the past 2 weeks.  She says that she wants to keep her on the Advair and Spiriva because she says it works good for the patient.  They will discuss in more detail with TP at next OV on 7/11.  Refill sent to pharmacy. Nothing further needed.

## 2015-03-16 NOTE — Telephone Encounter (Signed)
The first step is to try the advair back and if better then stop the spiriva and see how it goes but if wants the respimat it takes training to use the device correctly and best to have [place] do the training here and provide sample for trial   If on the other hand she's already used the repimat before ok to change now.

## 2015-03-16 NOTE — Telephone Encounter (Signed)
Left message for Tracy Richards to call back. 

## 2015-03-16 NOTE — Telephone Encounter (Signed)
Patient notified.  Wants a refill on Spiriva but wants the Respimat instead of the Handihaler.  She was given Handihaler by the doctor at [facility] and it is too expensive.  Do you want her to continue the Spiriva?  Please advise.

## 2015-03-16 NOTE — Telephone Encounter (Signed)
Daughter called in stating since pt stopped advair she has had increase coughing. Pt took tess pearls w/o relief. Wanting to go back to advair. She is scheduled for med calendar 04/13/15. Please advise MW thanks

## 2015-03-16 NOTE — Telephone Encounter (Signed)
Sorry to hear that, fine to restart

## 2015-03-18 DIAGNOSIS — I10 Essential (primary) hypertension: Secondary | ICD-10-CM | POA: Diagnosis not present

## 2015-03-18 DIAGNOSIS — I4891 Unspecified atrial fibrillation: Secondary | ICD-10-CM | POA: Diagnosis not present

## 2015-03-18 DIAGNOSIS — I509 Heart failure, unspecified: Secondary | ICD-10-CM | POA: Diagnosis not present

## 2015-03-18 DIAGNOSIS — E119 Type 2 diabetes mellitus without complications: Secondary | ICD-10-CM | POA: Diagnosis not present

## 2015-03-18 DIAGNOSIS — J189 Pneumonia, unspecified organism: Secondary | ICD-10-CM | POA: Diagnosis not present

## 2015-03-18 DIAGNOSIS — J449 Chronic obstructive pulmonary disease, unspecified: Secondary | ICD-10-CM | POA: Diagnosis not present

## 2015-03-19 DIAGNOSIS — E119 Type 2 diabetes mellitus without complications: Secondary | ICD-10-CM | POA: Diagnosis not present

## 2015-03-19 DIAGNOSIS — I10 Essential (primary) hypertension: Secondary | ICD-10-CM | POA: Diagnosis not present

## 2015-03-19 DIAGNOSIS — J189 Pneumonia, unspecified organism: Secondary | ICD-10-CM | POA: Diagnosis not present

## 2015-03-19 DIAGNOSIS — J449 Chronic obstructive pulmonary disease, unspecified: Secondary | ICD-10-CM | POA: Diagnosis not present

## 2015-03-19 DIAGNOSIS — I509 Heart failure, unspecified: Secondary | ICD-10-CM | POA: Diagnosis not present

## 2015-03-19 DIAGNOSIS — I4891 Unspecified atrial fibrillation: Secondary | ICD-10-CM | POA: Diagnosis not present

## 2015-03-24 DIAGNOSIS — I10 Essential (primary) hypertension: Secondary | ICD-10-CM | POA: Diagnosis not present

## 2015-03-24 DIAGNOSIS — J189 Pneumonia, unspecified organism: Secondary | ICD-10-CM | POA: Diagnosis not present

## 2015-03-24 DIAGNOSIS — I509 Heart failure, unspecified: Secondary | ICD-10-CM | POA: Diagnosis not present

## 2015-03-24 DIAGNOSIS — J449 Chronic obstructive pulmonary disease, unspecified: Secondary | ICD-10-CM | POA: Diagnosis not present

## 2015-03-24 DIAGNOSIS — E119 Type 2 diabetes mellitus without complications: Secondary | ICD-10-CM | POA: Diagnosis not present

## 2015-03-24 DIAGNOSIS — I4891 Unspecified atrial fibrillation: Secondary | ICD-10-CM | POA: Diagnosis not present

## 2015-03-25 DIAGNOSIS — E119 Type 2 diabetes mellitus without complications: Secondary | ICD-10-CM | POA: Diagnosis not present

## 2015-03-25 DIAGNOSIS — I4891 Unspecified atrial fibrillation: Secondary | ICD-10-CM | POA: Diagnosis not present

## 2015-03-25 DIAGNOSIS — I509 Heart failure, unspecified: Secondary | ICD-10-CM | POA: Diagnosis not present

## 2015-03-25 DIAGNOSIS — J449 Chronic obstructive pulmonary disease, unspecified: Secondary | ICD-10-CM | POA: Diagnosis not present

## 2015-03-25 DIAGNOSIS — J189 Pneumonia, unspecified organism: Secondary | ICD-10-CM | POA: Diagnosis not present

## 2015-03-25 DIAGNOSIS — I10 Essential (primary) hypertension: Secondary | ICD-10-CM | POA: Diagnosis not present

## 2015-03-31 DIAGNOSIS — J189 Pneumonia, unspecified organism: Secondary | ICD-10-CM | POA: Diagnosis not present

## 2015-03-31 DIAGNOSIS — I509 Heart failure, unspecified: Secondary | ICD-10-CM | POA: Diagnosis not present

## 2015-03-31 DIAGNOSIS — J449 Chronic obstructive pulmonary disease, unspecified: Secondary | ICD-10-CM | POA: Diagnosis not present

## 2015-03-31 DIAGNOSIS — E119 Type 2 diabetes mellitus without complications: Secondary | ICD-10-CM | POA: Diagnosis not present

## 2015-03-31 DIAGNOSIS — I10 Essential (primary) hypertension: Secondary | ICD-10-CM | POA: Diagnosis not present

## 2015-03-31 DIAGNOSIS — I4891 Unspecified atrial fibrillation: Secondary | ICD-10-CM | POA: Diagnosis not present

## 2015-04-02 DIAGNOSIS — I10 Essential (primary) hypertension: Secondary | ICD-10-CM | POA: Diagnosis not present

## 2015-04-02 DIAGNOSIS — I4891 Unspecified atrial fibrillation: Secondary | ICD-10-CM | POA: Diagnosis not present

## 2015-04-02 DIAGNOSIS — E119 Type 2 diabetes mellitus without complications: Secondary | ICD-10-CM | POA: Diagnosis not present

## 2015-04-02 DIAGNOSIS — I509 Heart failure, unspecified: Secondary | ICD-10-CM | POA: Diagnosis not present

## 2015-04-02 DIAGNOSIS — J189 Pneumonia, unspecified organism: Secondary | ICD-10-CM | POA: Diagnosis not present

## 2015-04-02 DIAGNOSIS — J449 Chronic obstructive pulmonary disease, unspecified: Secondary | ICD-10-CM | POA: Diagnosis not present

## 2015-04-06 DIAGNOSIS — J449 Chronic obstructive pulmonary disease, unspecified: Secondary | ICD-10-CM | POA: Diagnosis not present

## 2015-04-06 DIAGNOSIS — I4891 Unspecified atrial fibrillation: Secondary | ICD-10-CM | POA: Diagnosis not present

## 2015-04-06 DIAGNOSIS — E119 Type 2 diabetes mellitus without complications: Secondary | ICD-10-CM | POA: Diagnosis not present

## 2015-04-06 DIAGNOSIS — I509 Heart failure, unspecified: Secondary | ICD-10-CM | POA: Diagnosis not present

## 2015-04-06 DIAGNOSIS — J189 Pneumonia, unspecified organism: Secondary | ICD-10-CM | POA: Diagnosis not present

## 2015-04-06 DIAGNOSIS — I10 Essential (primary) hypertension: Secondary | ICD-10-CM | POA: Diagnosis not present

## 2015-04-09 DIAGNOSIS — I10 Essential (primary) hypertension: Secondary | ICD-10-CM | POA: Diagnosis not present

## 2015-04-09 DIAGNOSIS — E119 Type 2 diabetes mellitus without complications: Secondary | ICD-10-CM | POA: Diagnosis not present

## 2015-04-09 DIAGNOSIS — J189 Pneumonia, unspecified organism: Secondary | ICD-10-CM | POA: Diagnosis not present

## 2015-04-09 DIAGNOSIS — I4891 Unspecified atrial fibrillation: Secondary | ICD-10-CM | POA: Diagnosis not present

## 2015-04-09 DIAGNOSIS — I509 Heart failure, unspecified: Secondary | ICD-10-CM | POA: Diagnosis not present

## 2015-04-09 DIAGNOSIS — J449 Chronic obstructive pulmonary disease, unspecified: Secondary | ICD-10-CM | POA: Diagnosis not present

## 2015-04-10 DIAGNOSIS — J449 Chronic obstructive pulmonary disease, unspecified: Secondary | ICD-10-CM | POA: Diagnosis not present

## 2015-04-10 DIAGNOSIS — F419 Anxiety disorder, unspecified: Secondary | ICD-10-CM | POA: Diagnosis not present

## 2015-04-10 DIAGNOSIS — F329 Major depressive disorder, single episode, unspecified: Secondary | ICD-10-CM | POA: Diagnosis not present

## 2015-04-10 DIAGNOSIS — E119 Type 2 diabetes mellitus without complications: Secondary | ICD-10-CM | POA: Diagnosis not present

## 2015-04-10 DIAGNOSIS — I509 Heart failure, unspecified: Secondary | ICD-10-CM | POA: Diagnosis not present

## 2015-04-10 DIAGNOSIS — I4891 Unspecified atrial fibrillation: Secondary | ICD-10-CM | POA: Diagnosis not present

## 2015-04-10 DIAGNOSIS — R42 Dizziness and giddiness: Secondary | ICD-10-CM | POA: Diagnosis not present

## 2015-04-10 DIAGNOSIS — Z9981 Dependence on supplemental oxygen: Secondary | ICD-10-CM | POA: Diagnosis not present

## 2015-04-10 DIAGNOSIS — M1990 Unspecified osteoarthritis, unspecified site: Secondary | ICD-10-CM | POA: Diagnosis not present

## 2015-04-10 DIAGNOSIS — I10 Essential (primary) hypertension: Secondary | ICD-10-CM | POA: Diagnosis not present

## 2015-04-10 DIAGNOSIS — I739 Peripheral vascular disease, unspecified: Secondary | ICD-10-CM | POA: Diagnosis not present

## 2015-04-10 DIAGNOSIS — G629 Polyneuropathy, unspecified: Secondary | ICD-10-CM | POA: Diagnosis not present

## 2015-04-13 ENCOUNTER — Encounter: Payer: Medicare Other | Admitting: Adult Health

## 2015-04-16 DIAGNOSIS — I10 Essential (primary) hypertension: Secondary | ICD-10-CM | POA: Diagnosis not present

## 2015-04-16 DIAGNOSIS — E119 Type 2 diabetes mellitus without complications: Secondary | ICD-10-CM | POA: Diagnosis not present

## 2015-04-16 DIAGNOSIS — I4891 Unspecified atrial fibrillation: Secondary | ICD-10-CM | POA: Diagnosis not present

## 2015-04-16 DIAGNOSIS — J449 Chronic obstructive pulmonary disease, unspecified: Secondary | ICD-10-CM | POA: Diagnosis not present

## 2015-04-16 DIAGNOSIS — I739 Peripheral vascular disease, unspecified: Secondary | ICD-10-CM | POA: Diagnosis not present

## 2015-04-16 DIAGNOSIS — I509 Heart failure, unspecified: Secondary | ICD-10-CM | POA: Diagnosis not present

## 2015-04-20 ENCOUNTER — Encounter: Payer: Self-pay | Admitting: Emergency Medicine

## 2015-04-20 ENCOUNTER — Emergency Department: Payer: Medicare Other

## 2015-04-20 ENCOUNTER — Emergency Department
Admission: EM | Admit: 2015-04-20 | Discharge: 2015-04-20 | Disposition: A | Discharge status: Home / Self Care | Payer: Medicare Other | Attending: Student | Admitting: Student

## 2015-04-20 DIAGNOSIS — Z7951 Long term (current) use of inhaled steroids: Secondary | ICD-10-CM | POA: Diagnosis not present

## 2015-04-20 DIAGNOSIS — R109 Unspecified abdominal pain: Secondary | ICD-10-CM | POA: Diagnosis present

## 2015-04-20 DIAGNOSIS — K210 Gastro-esophageal reflux disease with esophagitis: Secondary | ICD-10-CM | POA: Diagnosis not present

## 2015-04-20 DIAGNOSIS — R05 Cough: Secondary | ICD-10-CM | POA: Diagnosis not present

## 2015-04-20 DIAGNOSIS — Z79899 Other long term (current) drug therapy: Secondary | ICD-10-CM | POA: Insufficient documentation

## 2015-04-20 DIAGNOSIS — E119 Type 2 diabetes mellitus without complications: Secondary | ICD-10-CM | POA: Insufficient documentation

## 2015-04-20 DIAGNOSIS — I700 Atherosclerosis of aorta: Secondary | ICD-10-CM | POA: Diagnosis not present

## 2015-04-20 DIAGNOSIS — J449 Chronic obstructive pulmonary disease, unspecified: Secondary | ICD-10-CM | POA: Diagnosis not present

## 2015-04-20 DIAGNOSIS — I10 Essential (primary) hypertension: Secondary | ICD-10-CM | POA: Diagnosis not present

## 2015-04-20 DIAGNOSIS — N261 Atrophy of kidney (terminal): Secondary | ICD-10-CM | POA: Diagnosis not present

## 2015-04-20 DIAGNOSIS — J90 Pleural effusion, not elsewhere classified: Secondary | ICD-10-CM | POA: Diagnosis not present

## 2015-04-20 DIAGNOSIS — K219 Gastro-esophageal reflux disease without esophagitis: Secondary | ICD-10-CM | POA: Diagnosis not present

## 2015-04-20 DIAGNOSIS — Z87891 Personal history of nicotine dependence: Secondary | ICD-10-CM | POA: Insufficient documentation

## 2015-04-20 DIAGNOSIS — K5730 Diverticulosis of large intestine without perforation or abscess without bleeding: Secondary | ICD-10-CM | POA: Diagnosis not present

## 2015-04-20 DIAGNOSIS — Z7982 Long term (current) use of aspirin: Secondary | ICD-10-CM | POA: Insufficient documentation

## 2015-04-20 DIAGNOSIS — R1013 Epigastric pain: Secondary | ICD-10-CM | POA: Diagnosis not present

## 2015-04-20 DIAGNOSIS — R062 Wheezing: Secondary | ICD-10-CM | POA: Diagnosis not present

## 2015-04-20 DIAGNOSIS — R1084 Generalized abdominal pain: Secondary | ICD-10-CM | POA: Diagnosis not present

## 2015-04-20 DIAGNOSIS — R079 Chest pain, unspecified: Secondary | ICD-10-CM | POA: Diagnosis not present

## 2015-04-20 HISTORY — DX: Unspecified asthma, uncomplicated: J45.909

## 2015-04-20 LAB — COMPREHENSIVE METABOLIC PANEL
ALT: 15 U/L (ref 14–54)
AST: 23 U/L (ref 15–41)
Albumin: 4.2 g/dL (ref 3.5–5.0)
Alkaline Phosphatase: 86 U/L (ref 38–126)
Anion gap: 7 (ref 5–15)
BUN: 17 mg/dL (ref 6–20)
CO2: 27 mmol/L (ref 22–32)
Calcium: 8.8 mg/dL — ABNORMAL LOW (ref 8.9–10.3)
Chloride: 98 mmol/L — ABNORMAL LOW (ref 101–111)
Creatinine, Ser: 0.85 mg/dL (ref 0.44–1.00)
GFR calc non Af Amer: >60 mL/min (ref >60)
GLUCOSE: 166 mg/dL — AB (ref 65–99)
POTASSIUM: 4.3 mmol/L (ref 3.5–5.1)
Sodium: 132 mmol/L — ABNORMAL LOW (ref 135–145)
TOTAL PROTEIN: 6.5 g/dL (ref 6.5–8.1)
Total Bilirubin: 0.8 mg/dL (ref 0.3–1.2)

## 2015-04-20 LAB — CBC WITH DIFFERENTIAL/PLATELET
BASOS ABS: 0.1 10*3/uL (ref 0–0.1)
Basophils Relative: 1 %
EOS ABS: 0.1 10*3/uL (ref 0–0.7)
EOS PCT: 1 %
HCT: 32.5 % — ABNORMAL LOW (ref 35.0–47.0)
Hemoglobin: 10.8 g/dL — ABNORMAL LOW (ref 12.0–16.0)
Lymphocytes Relative: 14 %
Lymphs Abs: 1.5 10*3/uL (ref 1.0–3.6)
MCH: 29.6 pg (ref 26.0–34.0)
MCHC: 33.1 g/dL (ref 32.0–36.0)
MCV: 89.6 fL (ref 80.0–100.0)
Monocytes Absolute: 0.9 10*3/uL (ref 0.2–0.9)
Monocytes Relative: 9 %
NEUTROS ABS: 8.1 10*3/uL — AB (ref 1.4–6.5)
NEUTROS PCT: 75 %
Platelets: 184 10*3/uL (ref 150–440)
RBC: 3.63 MIL/uL — ABNORMAL LOW (ref 3.80–5.20)
RDW: 14.9 % — ABNORMAL HIGH (ref 11.5–14.5)
WBC: 10.7 10*3/uL (ref 3.6–11.0)

## 2015-04-20 LAB — TROPONIN I
Troponin I: <0.03 ng/mL (ref <0.031)
Troponin I: <0.03 ng/mL (ref <0.031)

## 2015-04-20 LAB — URINALYSIS COMPLETE WITH MICROSCOPIC (ARMC ONLY)
Bacteria, UA: NONE SEEN
Bilirubin Urine: NEGATIVE
Glucose, UA: NEGATIVE mg/dL
Hgb urine dipstick: NEGATIVE
Ketones, ur: NEGATIVE mg/dL
LEUKOCYTES UA: NEGATIVE
Nitrite: NEGATIVE
PH: 6 (ref 5.0–8.0)
PROTEIN: NEGATIVE mg/dL
RBC / HPF: NONE SEEN RBC/hpf (ref 0–5)
Specific Gravity, Urine: 1.01 (ref 1.005–1.030)
WBC UA: NONE SEEN WBC/hpf (ref 0–5)

## 2015-04-20 LAB — PROTIME-INR
INR: 1.04
Prothrombin Time: 13.8 seconds (ref 11.4–15.0)

## 2015-04-20 LAB — BRAIN NATRIURETIC PEPTIDE: B NATRIURETIC PEPTIDE 5: 549 pg/mL — AB (ref 0.0–100.0)

## 2015-04-20 LAB — APTT: aPTT: 27 seconds (ref 24–36)

## 2015-04-20 MED ORDER — IOHEXOL 350 MG/ML SOLN
100.0000 mL | Freq: Once | INTRAVENOUS | Status: AC | PRN
  Administered 2015-04-20: 100 mL via INTRAVENOUS

## 2015-04-20 MED ORDER — ACETAMINOPHEN 325 MG PO TABS
650.0000 mg | ORAL_TABLET | Freq: Once | ORAL | Status: AC
  Administered 2015-04-20: 650 mg via ORAL
  Filled 2015-04-20: qty 2

## 2015-04-20 MED ORDER — GI COCKTAIL ~~LOC~~
30.0000 mL | Freq: Once | ORAL | Status: AC
  Administered 2015-04-20: 30 mL via ORAL
  Filled 2015-04-20: qty 30

## 2015-04-20 NOTE — ED Notes (Signed)
Pt to room 5 via [place] from home; no distress noted, pt A&Ox3; pt reports recently tx for pneumonia; c/o occas productive cough white/yellow sputum with SOB and pain to upper ribcage with deep breathing or coughing; pt wears O2 at 2l/min via [place] all times

## 2015-04-20 NOTE — ED Notes (Signed)
Pt presents to ER alert and in NAD from home. Pt is O2 dependent and reports persistant cough for several months. Pt also reports bilat flank pain.

## 2015-04-20 NOTE — ED Provider Notes (Addendum)
[facility] Emergency Department Provider Note  ____________________________________________  Time seen: Approximately 2:47 AM  I have reviewed the triage vital signs and the nursing notes.   HISTORY  Chief Complaint Flank Pain and Shortness of Breath    HPI MARLINDA Richards is a 81 y.o. female with history of GERD, atrial fibrillation, hypertension, CHF, COPD with chronic 2 L home oxygen requirement presents for evaluation of epigastric and sharp chest pain radiating to bilateral flanks, sudden onset this evening, constant since onset. No vomiting, no diarrhea, no fevers, no chills. Patient reports chronic productive cough for the past several months with chronic shortness of breath, not increased from baseline. No dysuria Severity of symptoms is moderate. No modifying factors.    Past Medical History  Diagnosis Date  . Depression   . PVD (peripheral vascular disease)     blochage in Right leg -- had stent placement  . Hypertension   . Hyperlipidemia   . Gout   . Peripheral neuropathy   . Spinal stenosis   . GERD (gastroesophageal reflux disease)   . Diabetes   . COPD (chronic obstructive pulmonary disease)   . Hypothyroid   . Spinal stenosis   . Unspecified hereditary and idiopathic peripheral neuropathy   . Osteoporosis   . Renal artery stenosis   . Benign paroxysmal vertigo   . Asthma     Patient Active Problem List   Diagnosis Date Noted  . Chronic respiratory failure with hypoxia 03/13/2015  . COPD bronchitis 10/20/2014  . Dyspnea 04/01/2013  . Other specified hypothyroidism 04/01/2013  . Cough 03/19/2013  . HBP (high blood pressure) 03/19/2013    Past Surgical History  Procedure Laterality Date  . Tonsillectomy    . Appendectomy    . Abdominal hysterectomy    . Cataract extraction w/phaco    . Back surgery    . Vein surgery      VEIN STRIPPED  . Iliac artery stent    . Kidney surgery      ballooned multiple times    Current  Outpatient Rx  Name  Route  Sig  Dispense  Refill  . ALPRAZolam (XANAX) 0.5 MG tablet   Oral   Take 0.5 mg by mouth 2 (two) times daily as needed for anxiety.         Tracy Richards aspirin 81 MG tablet   Oral   Take 81 mg by mouth daily.         Tracy Richards atorvastatin (LIPITOR) 40 MG tablet   Oral   Take 40 mg by mouth daily.         Tracy Richards diltiazem (CARDIZEM CD) 120 MG 24 hr capsule   Oral   Take 120 mg by mouth daily.      4   . Fluticasone-Salmeterol (ADVAIR DISKUS) 250-50 MCG/DOSE AEPB   Inhalation   Inhale 1 puff into the lungs 2 (two) times daily.   60 each   0   . gabapentin (NEURONTIN) 300 MG capsule   Oral   Take 600 mg by mouth 3 (three) times daily.         Tracy Richards levothyroxine (SYNTHROID, LEVOTHROID) 100 MCG tablet   Oral   Take 100 mcg by mouth daily.      4   . losartan (COZAAR) 100 MG tablet   Oral   Take 100 mg by mouth daily.         . meclizine (ANTIVERT) 25 MG tablet   Oral   Take 25  mg by mouth as needed.         . metoprolol tartrate (LOPRESSOR) 25 MG tablet   Oral   Take 25 mg by mouth 2 (two) times daily.      4   . pantoprazole (PROTONIX) 40 MG tablet      TAKE 1 TABLET BY MOUTH EVERY DAY . TAKE 30-60 MINUTES BEFORE MEAL OF THE DAY Patient taking differently: 1 tablet bid   30 tablet   11   . potassium chloride (K-DUR) 10 MEQ tablet   Oral   Take 10 mEq by mouth daily.         . Tiotropium Bromide Monohydrate (SPIRIVA RESPIMAT) 2.5 MCG/ACT AERS   Inhalation   Inhale 2 puffs into the lungs daily.   1 Inhaler   0   . torsemide (DEMADEX) 20 MG tablet   Oral   Take 40 mg by mouth daily.            Allergies Amlodipine; Avelox; Carafate; Celecoxib; Diazepam; Doxazosin; Klonopin; Levofloxacin; Norvasc; Pantoprazole sodium; Ranitidine; Rofecoxib; Sulfa antibiotics; Sulfa drugs cross reactors; and Codeine  Family History  Problem Relation Age of Onset  . Kidney disease Brother     Brights disease  . Bone cancer Brother   .  Hypertension Mother   . Sudden death Mother     unknown causes  . Sudden death Father     unknown causes  . Ovarian cancer Daughter     Social History History  Substance Use Topics  . Smoking status: Former Smoker -- 0.25 packs/day for 20 years    Types: Cigarettes    Quit date: 10/03/1978  . Smokeless tobacco: Never Used  . Alcohol Use: No    Review of Systems Constitutional: No fever/chills Eyes: No visual changes. ENT: No sore throat. Cardiovascular: + chest pain. Respiratory: +shortness of breath. Gastrointestinal: + abdominal pain.  No nausea, no vomiting.  No diarrhea.  No constipation. Genitourinary: Negative for dysuria. Musculoskeletal: + for back pain. Skin: Negative for rash. Neurological: Negative for headaches, focal weakness or numbness.  10-point ROS otherwise negative.  ____________________________________________   PHYSICAL EXAM:  VITAL SIGNS: ED Triage Vitals  Enc Vitals Group     BP --      Pulse --      Resp --      Temp --      Temp src --      SpO2 --      Weight 04/20/15 0152 138 lb (62.596 kg)     Height 04/20/15 0152 5' (1.524 m)     Head Cir --      Peak Flow --      Pain Score 04/20/15 0152 8     Pain Loc --      Pain Edu? --      Excl. in [place]? --     Constitutional: Alert and oriented. Well appearing and in no acute distress. Eyes: Conjunctivae are normal. PERRL. EOMI. Head: Atraumatic. Nose: No congestion/rhinnorhea. Mouth/Throat: Mucous membranes are moist.  Oropharynx non-erythematous. Neck: No stridor.   Cardiovascular: Normal rate, irregular rhythm. Grossly normal heart sounds.  Good peripheral circulation. Respiratory: Normal respiratory effort.  No retractions. Diminished breath sounds at bilateral bases. Gastrointestinal: Soft with mild tenderness throughout the abdomen. No distention. No abdominal bruits. No CVA tenderness. Genitourinary: Deferred Musculoskeletal: No lower extremity tenderness nor edema.  No joint  effusions. Neurologic:  Normal speech and language. No gross focal neurologic deficits are appreciated. No gait instability.  Skin:  Skin is warm, dry and intact. No rash noted. Psychiatric: Mood and affect are normal. Speech and behavior are normal.  ____________________________________________   LABS (all labs ordered are listed, but only abnormal results are displayed)  Labs Reviewed  CBC WITH DIFFERENTIAL/PLATELET - Abnormal; Notable for the following:    RBC 3.63 (*)    Hemoglobin 10.8 (*)    HCT 32.5 (*)    RDW 14.9 (*)    Neutro Abs 8.1 (*)    All other components within normal limits  COMPREHENSIVE METABOLIC PANEL - Abnormal; Notable for the following:    Sodium 132 (*)    Chloride 98 (*)    Glucose, Bld 166 (*)    Calcium 8.8 (*)    All other components within normal limits  BRAIN NATRIURETIC PEPTIDE - Abnormal; Notable for the following:    B Natriuretic Peptide 549.0 (*)    All other components within normal limits  URINALYSIS COMPLETEWITH MICROSCOPIC ([facility] ONLY) - Abnormal; Notable for the following:    Color, Urine YELLOW (*)    APPearance CLEAR (*)    Squamous Epithelial / LPF 0-5 (*)    All other components within normal limits  TROPONIN I  APTT  PROTIME-INR  TROPONIN I   ____________________________________________  EKG  ED ECG REPORT I, Joanne Gavel, the attending physician, personally viewed and interpreted this ECG.   Date: 04/20/2015  EKG Time: 01:55  Rate: 83  Rhythm: atrial fibrillation, rate 83  Axis: normal  Intervals:right bundle branch block, incomplete  ST&T Change: No acute ST segment elevation  ____________________________________________  RADIOLOGY  CXR  FINDINGS: Cardiac silhouette is moderately enlarged unchanged, mediastinal silhouette is nonsuspicious, similarly calcified aortic knob. Similar chronic interstitial changes without pleural effusion or focal consolidation. Bibasilar atelectasis. Increased lung volumes with  mildly flattened hemidiaphragms. No pneumothorax. Vascular calcifications in the axilla neck. Osseous structures are nonsuspicious, patient is osteopenic.  IMPRESSION: Stable cardiomegaly and COPD/chronic interstitial changes.    CTA chest/abdomen/pelvis IMPRESSION: 1. No evidence of aortic dissection. No evidence of aneurysmal dilatation. Relatively diffuse calcification along the aortic arch, descending thoracic aorta, and abdominal aorta and its branches, with mild chronic mural thrombus along the aortic arch. 2. Mild to moderate luminal narrowing along the proximal right subclavian artery, and moderate to severe luminal narrowing along the proximal left subclavian artery. Likely mild luminal narrowing along the right common iliac artery. 3. No evidence of pulmonary embolus. 4. Trace bilateral pleural effusions, with underlying bilateral emphysematous change, and associated atelectasis or scarring. 5. Diffuse coronary artery calcifications noted. Mild biatrial enlargement seen. 6. Enlarged 1.5 cm right paratracheal node, of uncertain significance. 7. Mild bilateral renal atrophy noted, with bilateral renal scarring. 8. Scattered diverticulosis along the sigmoid colon, without evidence of diverticulitis. 9. Mild degenerative change along the lower lumbar spine, with mild chronic loss of height at L3.  ____________________________________________   PROCEDURES  Procedure(s) performed: None  Critical Care performed: No  ____________________________________________   INITIAL IMPRESSION / ASSESSMENT AND PLAN / ED COURSE  Pertinent labs & imaging results that were available during my care of the patient were reviewed by me and considered in my medical decision making (see chart for details).  NATESHA HASSEY is a 81 y.o. female with history of GERD, atrial fibrillation, hypertension, CHF, COPD with chronic 2 L home oxygen requirement presents for evaluation of epigastric  and chest pain radiating to bilateral flanks, sudden onset this evening, constant since onset. On exam, she is nontoxic appearing, in  no acute distress. She has tenderness to palpation throughout the abdomen. EKG shows known atrial fibrillation. Labs reviewed and are notable for mild chronic stable anemia, mild hyponatremia. First troponin negative. BNP is elevated at 549 however chest x-ray shows no evidence of interstitial edema and she does not appear grossly volume overloaded. Given her advanced age, complaining of chest pain and abdominal pain radiating to the back, will obtain CTA chest abdomen and pelvis to rule out dissection. We'll repeat troponin.  ----------------------------------------- 6:21 AM on 04/20/2015 -----------------------------------------  CT negative for any acute life threatening pathology either in the chest, abdomen or pelvis. UA without evidence of infection. 2 sets of troponins negative  -doubt ACS or PE. Patient with complete resolution of her symptoms after GI cocktail, suspect GERD as the most likely cause of her symptoms. Discussed return precautions, need for close PCP follow-up and the patient and her daughter at bedside are both comfortable with the discharge plan. ____________________________________________   FINAL CLINICAL IMPRESSION(S) / ED DIAGNOSES  Final diagnoses:  Epigastric pain  Chest pain, unspecified chest pain type  Gastroesophageal reflux disease, esophagitis presence not specified      Joanne Gavel, MD 04/20/15 [id number]  Joanne Gavel, MD 04/20/15 [id number]  Joanne Gavel, MD 04/20/15 [phone]

## 2015-04-20 NOTE — ED Notes (Signed)
Pts daughter at bedside  

## 2015-04-21 DIAGNOSIS — E119 Type 2 diabetes mellitus without complications: Secondary | ICD-10-CM | POA: Diagnosis not present

## 2015-04-21 DIAGNOSIS — I739 Peripheral vascular disease, unspecified: Secondary | ICD-10-CM | POA: Diagnosis not present

## 2015-04-21 DIAGNOSIS — I10 Essential (primary) hypertension: Secondary | ICD-10-CM | POA: Diagnosis not present

## 2015-04-21 DIAGNOSIS — I4891 Unspecified atrial fibrillation: Secondary | ICD-10-CM | POA: Diagnosis not present

## 2015-04-21 DIAGNOSIS — J449 Chronic obstructive pulmonary disease, unspecified: Secondary | ICD-10-CM | POA: Diagnosis not present

## 2015-04-21 DIAGNOSIS — I509 Heart failure, unspecified: Secondary | ICD-10-CM | POA: Diagnosis not present

## 2015-04-28 DIAGNOSIS — I739 Peripheral vascular disease, unspecified: Secondary | ICD-10-CM | POA: Diagnosis not present

## 2015-04-28 DIAGNOSIS — I10 Essential (primary) hypertension: Secondary | ICD-10-CM | POA: Diagnosis not present

## 2015-04-28 DIAGNOSIS — J449 Chronic obstructive pulmonary disease, unspecified: Secondary | ICD-10-CM | POA: Diagnosis not present

## 2015-04-28 DIAGNOSIS — I4891 Unspecified atrial fibrillation: Secondary | ICD-10-CM | POA: Diagnosis not present

## 2015-04-28 DIAGNOSIS — E119 Type 2 diabetes mellitus without complications: Secondary | ICD-10-CM | POA: Diagnosis not present

## 2015-04-28 DIAGNOSIS — I509 Heart failure, unspecified: Secondary | ICD-10-CM | POA: Diagnosis not present

## 2015-05-05 DIAGNOSIS — J449 Chronic obstructive pulmonary disease, unspecified: Secondary | ICD-10-CM | POA: Diagnosis not present

## 2015-05-05 DIAGNOSIS — E119 Type 2 diabetes mellitus without complications: Secondary | ICD-10-CM | POA: Diagnosis not present

## 2015-05-05 DIAGNOSIS — I4891 Unspecified atrial fibrillation: Secondary | ICD-10-CM | POA: Diagnosis not present

## 2015-05-05 DIAGNOSIS — I10 Essential (primary) hypertension: Secondary | ICD-10-CM | POA: Diagnosis not present

## 2015-05-05 DIAGNOSIS — I509 Heart failure, unspecified: Secondary | ICD-10-CM | POA: Diagnosis not present

## 2015-05-05 DIAGNOSIS — I739 Peripheral vascular disease, unspecified: Secondary | ICD-10-CM | POA: Diagnosis not present

## 2015-05-12 DIAGNOSIS — I509 Heart failure, unspecified: Secondary | ICD-10-CM | POA: Diagnosis not present

## 2015-05-12 DIAGNOSIS — E119 Type 2 diabetes mellitus without complications: Secondary | ICD-10-CM | POA: Diagnosis not present

## 2015-05-12 DIAGNOSIS — I739 Peripheral vascular disease, unspecified: Secondary | ICD-10-CM | POA: Diagnosis not present

## 2015-05-12 DIAGNOSIS — I4891 Unspecified atrial fibrillation: Secondary | ICD-10-CM | POA: Diagnosis not present

## 2015-05-12 DIAGNOSIS — J449 Chronic obstructive pulmonary disease, unspecified: Secondary | ICD-10-CM | POA: Diagnosis not present

## 2015-05-12 DIAGNOSIS — I10 Essential (primary) hypertension: Secondary | ICD-10-CM | POA: Diagnosis not present

## 2015-05-19 DIAGNOSIS — E119 Type 2 diabetes mellitus without complications: Secondary | ICD-10-CM | POA: Diagnosis not present

## 2015-05-19 DIAGNOSIS — J449 Chronic obstructive pulmonary disease, unspecified: Secondary | ICD-10-CM | POA: Diagnosis not present

## 2015-05-19 DIAGNOSIS — I739 Peripheral vascular disease, unspecified: Secondary | ICD-10-CM | POA: Diagnosis not present

## 2015-05-19 DIAGNOSIS — I509 Heart failure, unspecified: Secondary | ICD-10-CM | POA: Diagnosis not present

## 2015-05-19 DIAGNOSIS — I4891 Unspecified atrial fibrillation: Secondary | ICD-10-CM | POA: Diagnosis not present

## 2015-05-19 DIAGNOSIS — I10 Essential (primary) hypertension: Secondary | ICD-10-CM | POA: Diagnosis not present

## 2015-05-19 DIAGNOSIS — H34832 Tributary (branch) retinal vein occlusion, left eye: Secondary | ICD-10-CM | POA: Diagnosis not present

## 2015-05-25 DIAGNOSIS — H34832 Tributary (branch) retinal vein occlusion, left eye: Secondary | ICD-10-CM | POA: Diagnosis not present

## 2015-05-26 DIAGNOSIS — I10 Essential (primary) hypertension: Secondary | ICD-10-CM | POA: Diagnosis not present

## 2015-05-26 DIAGNOSIS — I739 Peripheral vascular disease, unspecified: Secondary | ICD-10-CM | POA: Diagnosis not present

## 2015-05-26 DIAGNOSIS — I4891 Unspecified atrial fibrillation: Secondary | ICD-10-CM | POA: Diagnosis not present

## 2015-05-26 DIAGNOSIS — J449 Chronic obstructive pulmonary disease, unspecified: Secondary | ICD-10-CM | POA: Diagnosis not present

## 2015-05-26 DIAGNOSIS — I509 Heart failure, unspecified: Secondary | ICD-10-CM | POA: Diagnosis not present

## 2015-05-26 DIAGNOSIS — E119 Type 2 diabetes mellitus without complications: Secondary | ICD-10-CM | POA: Diagnosis not present

## 2015-06-02 DIAGNOSIS — I739 Peripheral vascular disease, unspecified: Secondary | ICD-10-CM | POA: Diagnosis not present

## 2015-06-02 DIAGNOSIS — I4891 Unspecified atrial fibrillation: Secondary | ICD-10-CM | POA: Diagnosis not present

## 2015-06-02 DIAGNOSIS — I10 Essential (primary) hypertension: Secondary | ICD-10-CM | POA: Diagnosis not present

## 2015-06-02 DIAGNOSIS — J449 Chronic obstructive pulmonary disease, unspecified: Secondary | ICD-10-CM | POA: Diagnosis not present

## 2015-06-02 DIAGNOSIS — E119 Type 2 diabetes mellitus without complications: Secondary | ICD-10-CM | POA: Diagnosis not present

## 2015-06-02 DIAGNOSIS — I509 Heart failure, unspecified: Secondary | ICD-10-CM | POA: Diagnosis not present

## 2015-06-12 DIAGNOSIS — I10 Essential (primary) hypertension: Secondary | ICD-10-CM | POA: Diagnosis not present

## 2015-06-12 DIAGNOSIS — E119 Type 2 diabetes mellitus without complications: Secondary | ICD-10-CM | POA: Diagnosis not present

## 2015-06-12 DIAGNOSIS — J9610 Chronic respiratory failure, unspecified whether with hypoxia or hypercapnia: Secondary | ICD-10-CM | POA: Diagnosis not present

## 2015-06-12 DIAGNOSIS — I519 Heart disease, unspecified: Secondary | ICD-10-CM | POA: Diagnosis not present

## 2015-06-12 DIAGNOSIS — Z9181 History of falling: Secondary | ICD-10-CM | POA: Diagnosis not present

## 2015-06-12 DIAGNOSIS — J449 Chronic obstructive pulmonary disease, unspecified: Secondary | ICD-10-CM | POA: Diagnosis not present

## 2015-06-12 DIAGNOSIS — M5416 Radiculopathy, lumbar region: Secondary | ICD-10-CM | POA: Diagnosis not present

## 2015-06-12 DIAGNOSIS — E039 Hypothyroidism, unspecified: Secondary | ICD-10-CM | POA: Diagnosis not present

## 2015-06-22 DIAGNOSIS — I10 Essential (primary) hypertension: Secondary | ICD-10-CM | POA: Diagnosis not present

## 2015-06-22 DIAGNOSIS — I482 Chronic atrial fibrillation: Secondary | ICD-10-CM | POA: Diagnosis not present

## 2015-06-22 DIAGNOSIS — I509 Heart failure, unspecified: Secondary | ICD-10-CM | POA: Diagnosis not present

## 2015-06-22 DIAGNOSIS — J449 Chronic obstructive pulmonary disease, unspecified: Secondary | ICD-10-CM | POA: Diagnosis not present

## 2015-06-25 DIAGNOSIS — I10 Essential (primary) hypertension: Secondary | ICD-10-CM | POA: Diagnosis not present

## 2015-06-25 DIAGNOSIS — I482 Chronic atrial fibrillation: Secondary | ICD-10-CM | POA: Diagnosis not present

## 2015-06-25 DIAGNOSIS — J449 Chronic obstructive pulmonary disease, unspecified: Secondary | ICD-10-CM | POA: Diagnosis not present

## 2015-06-25 DIAGNOSIS — I509 Heart failure, unspecified: Secondary | ICD-10-CM | POA: Diagnosis not present

## 2015-06-30 DIAGNOSIS — J449 Chronic obstructive pulmonary disease, unspecified: Secondary | ICD-10-CM | POA: Diagnosis not present

## 2015-06-30 DIAGNOSIS — I10 Essential (primary) hypertension: Secondary | ICD-10-CM | POA: Diagnosis not present

## 2015-06-30 DIAGNOSIS — I482 Chronic atrial fibrillation: Secondary | ICD-10-CM | POA: Diagnosis not present

## 2015-06-30 DIAGNOSIS — I509 Heart failure, unspecified: Secondary | ICD-10-CM | POA: Diagnosis not present

## 2015-07-02 DIAGNOSIS — I482 Chronic atrial fibrillation: Secondary | ICD-10-CM | POA: Diagnosis not present

## 2015-07-02 DIAGNOSIS — I10 Essential (primary) hypertension: Secondary | ICD-10-CM | POA: Diagnosis not present

## 2015-07-02 DIAGNOSIS — J449 Chronic obstructive pulmonary disease, unspecified: Secondary | ICD-10-CM | POA: Diagnosis not present

## 2015-07-02 DIAGNOSIS — I509 Heart failure, unspecified: Secondary | ICD-10-CM | POA: Diagnosis not present

## 2015-07-03 DIAGNOSIS — Z9181 History of falling: Secondary | ICD-10-CM | POA: Diagnosis not present

## 2015-07-03 DIAGNOSIS — R0989 Other specified symptoms and signs involving the circulatory and respiratory systems: Secondary | ICD-10-CM | POA: Diagnosis not present

## 2015-07-03 DIAGNOSIS — Z1389 Encounter for screening for other disorder: Secondary | ICD-10-CM | POA: Diagnosis not present

## 2015-07-03 DIAGNOSIS — J069 Acute upper respiratory infection, unspecified: Secondary | ICD-10-CM | POA: Diagnosis not present

## 2015-07-06 DIAGNOSIS — I509 Heart failure, unspecified: Secondary | ICD-10-CM | POA: Diagnosis not present

## 2015-07-06 DIAGNOSIS — I10 Essential (primary) hypertension: Secondary | ICD-10-CM | POA: Diagnosis not present

## 2015-07-06 DIAGNOSIS — I482 Chronic atrial fibrillation: Secondary | ICD-10-CM | POA: Diagnosis not present

## 2015-07-06 DIAGNOSIS — J449 Chronic obstructive pulmonary disease, unspecified: Secondary | ICD-10-CM | POA: Diagnosis not present

## 2015-07-08 DIAGNOSIS — I482 Chronic atrial fibrillation: Secondary | ICD-10-CM | POA: Diagnosis not present

## 2015-07-08 DIAGNOSIS — E7800 Pure hypercholesterolemia, unspecified: Secondary | ICD-10-CM | POA: Diagnosis not present

## 2015-07-08 DIAGNOSIS — I5032 Chronic diastolic (congestive) heart failure: Secondary | ICD-10-CM | POA: Diagnosis not present

## 2015-07-08 DIAGNOSIS — Z961 Presence of intraocular lens: Secondary | ICD-10-CM | POA: Diagnosis not present

## 2015-07-08 DIAGNOSIS — J449 Chronic obstructive pulmonary disease, unspecified: Secondary | ICD-10-CM | POA: Diagnosis not present

## 2015-07-08 DIAGNOSIS — I10 Essential (primary) hypertension: Secondary | ICD-10-CM | POA: Diagnosis not present

## 2015-07-08 DIAGNOSIS — R011 Cardiac murmur, unspecified: Secondary | ICD-10-CM | POA: Diagnosis not present

## 2015-07-08 DIAGNOSIS — I748 Embolism and thrombosis of other arteries: Secondary | ICD-10-CM | POA: Diagnosis not present

## 2015-07-08 DIAGNOSIS — I701 Atherosclerosis of renal artery: Secondary | ICD-10-CM | POA: Diagnosis not present

## 2015-07-09 DIAGNOSIS — I509 Heart failure, unspecified: Secondary | ICD-10-CM | POA: Diagnosis not present

## 2015-07-09 DIAGNOSIS — J449 Chronic obstructive pulmonary disease, unspecified: Secondary | ICD-10-CM | POA: Diagnosis not present

## 2015-07-09 DIAGNOSIS — I10 Essential (primary) hypertension: Secondary | ICD-10-CM | POA: Diagnosis not present

## 2015-07-09 DIAGNOSIS — I482 Chronic atrial fibrillation: Secondary | ICD-10-CM | POA: Diagnosis not present

## 2015-07-12 DIAGNOSIS — G5702 Lesion of sciatic nerve, left lower limb: Secondary | ICD-10-CM | POA: Diagnosis not present

## 2015-07-14 DIAGNOSIS — I10 Essential (primary) hypertension: Secondary | ICD-10-CM | POA: Diagnosis not present

## 2015-07-14 DIAGNOSIS — I482 Chronic atrial fibrillation: Secondary | ICD-10-CM | POA: Diagnosis not present

## 2015-07-14 DIAGNOSIS — J449 Chronic obstructive pulmonary disease, unspecified: Secondary | ICD-10-CM | POA: Diagnosis not present

## 2015-07-14 DIAGNOSIS — I509 Heart failure, unspecified: Secondary | ICD-10-CM | POA: Diagnosis not present

## 2015-07-15 DIAGNOSIS — I482 Chronic atrial fibrillation: Secondary | ICD-10-CM | POA: Diagnosis not present

## 2015-07-15 DIAGNOSIS — J449 Chronic obstructive pulmonary disease, unspecified: Secondary | ICD-10-CM | POA: Diagnosis not present

## 2015-07-15 DIAGNOSIS — I10 Essential (primary) hypertension: Secondary | ICD-10-CM | POA: Diagnosis not present

## 2015-07-15 DIAGNOSIS — I509 Heart failure, unspecified: Secondary | ICD-10-CM | POA: Diagnosis not present

## 2015-07-21 DIAGNOSIS — M4726 Other spondylosis with radiculopathy, lumbar region: Secondary | ICD-10-CM | POA: Diagnosis not present

## 2015-07-21 DIAGNOSIS — M5136 Other intervertebral disc degeneration, lumbar region: Secondary | ICD-10-CM | POA: Diagnosis not present

## 2015-07-21 DIAGNOSIS — M545 Low back pain: Secondary | ICD-10-CM | POA: Diagnosis not present

## 2015-07-21 DIAGNOSIS — M4316 Spondylolisthesis, lumbar region: Secondary | ICD-10-CM | POA: Diagnosis not present

## 2015-07-22 ENCOUNTER — Other Ambulatory Visit: Payer: Self-pay | Admitting: Orthopedic Surgery

## 2015-07-22 DIAGNOSIS — M4726 Other spondylosis with radiculopathy, lumbar region: Secondary | ICD-10-CM

## 2015-07-22 DIAGNOSIS — M5136 Other intervertebral disc degeneration, lumbar region: Secondary | ICD-10-CM

## 2015-07-22 DIAGNOSIS — M4316 Spondylolisthesis, lumbar region: Secondary | ICD-10-CM

## 2015-07-31 DIAGNOSIS — Z23 Encounter for immunization: Secondary | ICD-10-CM | POA: Diagnosis not present

## 2015-08-01 ENCOUNTER — Ambulatory Visit: Payer: Medicare Other

## 2015-08-19 DIAGNOSIS — H348322 Tributary (branch) retinal vein occlusion, left eye, stable: Secondary | ICD-10-CM | POA: Diagnosis not present

## 2015-09-11 DIAGNOSIS — H8110 Benign paroxysmal vertigo, unspecified ear: Secondary | ICD-10-CM | POA: Diagnosis not present

## 2015-09-11 DIAGNOSIS — E039 Hypothyroidism, unspecified: Secondary | ICD-10-CM | POA: Diagnosis not present

## 2015-09-11 DIAGNOSIS — I739 Peripheral vascular disease, unspecified: Secondary | ICD-10-CM | POA: Diagnosis not present

## 2015-09-11 DIAGNOSIS — I10 Essential (primary) hypertension: Secondary | ICD-10-CM | POA: Diagnosis not present

## 2015-09-11 DIAGNOSIS — G629 Polyneuropathy, unspecified: Secondary | ICD-10-CM | POA: Diagnosis not present

## 2015-09-11 DIAGNOSIS — E119 Type 2 diabetes mellitus without complications: Secondary | ICD-10-CM | POA: Diagnosis not present

## 2015-09-11 DIAGNOSIS — E7800 Pure hypercholesterolemia, unspecified: Secondary | ICD-10-CM | POA: Diagnosis not present

## 2015-09-11 DIAGNOSIS — Z6827 Body mass index (BMI) 27.0-27.9, adult: Secondary | ICD-10-CM | POA: Diagnosis not present

## 2015-09-25 DIAGNOSIS — Z6828 Body mass index (BMI) 28.0-28.9, adult: Secondary | ICD-10-CM | POA: Diagnosis not present

## 2015-09-25 DIAGNOSIS — R6889 Other general symptoms and signs: Secondary | ICD-10-CM | POA: Diagnosis not present

## 2015-09-25 DIAGNOSIS — J069 Acute upper respiratory infection, unspecified: Secondary | ICD-10-CM | POA: Diagnosis not present

## 2015-10-07 DIAGNOSIS — J441 Chronic obstructive pulmonary disease with (acute) exacerbation: Secondary | ICD-10-CM | POA: Diagnosis not present

## 2015-11-08 DIAGNOSIS — L298 Other pruritus: Secondary | ICD-10-CM | POA: Diagnosis not present

## 2015-11-13 DIAGNOSIS — R21 Rash and other nonspecific skin eruption: Secondary | ICD-10-CM | POA: Diagnosis not present

## 2015-11-13 DIAGNOSIS — Z6827 Body mass index (BMI) 27.0-27.9, adult: Secondary | ICD-10-CM | POA: Diagnosis not present

## 2015-11-13 DIAGNOSIS — E663 Overweight: Secondary | ICD-10-CM | POA: Diagnosis not present

## 2015-12-19 ENCOUNTER — Other Ambulatory Visit: Payer: Self-pay | Admitting: Internal Medicine

## 2015-12-28 DIAGNOSIS — I739 Peripheral vascular disease, unspecified: Secondary | ICD-10-CM | POA: Diagnosis not present

## 2015-12-28 DIAGNOSIS — Z79899 Other long term (current) drug therapy: Secondary | ICD-10-CM | POA: Diagnosis not present

## 2015-12-28 DIAGNOSIS — E119 Type 2 diabetes mellitus without complications: Secondary | ICD-10-CM | POA: Diagnosis not present

## 2015-12-28 DIAGNOSIS — I4891 Unspecified atrial fibrillation: Secondary | ICD-10-CM | POA: Diagnosis not present

## 2015-12-28 DIAGNOSIS — I519 Heart disease, unspecified: Secondary | ICD-10-CM | POA: Diagnosis not present

## 2015-12-28 DIAGNOSIS — J9610 Chronic respiratory failure, unspecified whether with hypoxia or hypercapnia: Secondary | ICD-10-CM | POA: Diagnosis not present

## 2015-12-28 DIAGNOSIS — E7800 Pure hypercholesterolemia, unspecified: Secondary | ICD-10-CM | POA: Diagnosis not present

## 2015-12-28 DIAGNOSIS — H8110 Benign paroxysmal vertigo, unspecified ear: Secondary | ICD-10-CM | POA: Diagnosis not present

## 2016-01-05 DIAGNOSIS — R011 Cardiac murmur, unspecified: Secondary | ICD-10-CM | POA: Diagnosis not present

## 2016-01-05 DIAGNOSIS — E7800 Pure hypercholesterolemia, unspecified: Secondary | ICD-10-CM | POA: Diagnosis not present

## 2016-01-05 DIAGNOSIS — E119 Type 2 diabetes mellitus without complications: Secondary | ICD-10-CM | POA: Diagnosis not present

## 2016-01-05 DIAGNOSIS — I480 Paroxysmal atrial fibrillation: Secondary | ICD-10-CM | POA: Diagnosis not present

## 2016-01-05 DIAGNOSIS — I701 Atherosclerosis of renal artery: Secondary | ICD-10-CM | POA: Diagnosis not present

## 2016-01-05 DIAGNOSIS — I10 Essential (primary) hypertension: Secondary | ICD-10-CM | POA: Diagnosis not present

## 2016-01-05 DIAGNOSIS — I5032 Chronic diastolic (congestive) heart failure: Secondary | ICD-10-CM | POA: Diagnosis not present

## 2016-01-05 DIAGNOSIS — J449 Chronic obstructive pulmonary disease, unspecified: Secondary | ICD-10-CM | POA: Diagnosis not present

## 2016-01-13 DIAGNOSIS — R300 Dysuria: Secondary | ICD-10-CM | POA: Diagnosis not present

## 2016-01-13 DIAGNOSIS — J441 Chronic obstructive pulmonary disease with (acute) exacerbation: Secondary | ICD-10-CM | POA: Diagnosis not present

## 2016-01-13 DIAGNOSIS — Z9181 History of falling: Secondary | ICD-10-CM | POA: Diagnosis not present

## 2016-01-13 DIAGNOSIS — Z1389 Encounter for screening for other disorder: Secondary | ICD-10-CM | POA: Diagnosis not present

## 2016-01-14 DIAGNOSIS — R300 Dysuria: Secondary | ICD-10-CM | POA: Diagnosis not present

## 2016-03-02 DIAGNOSIS — H1010 Acute atopic conjunctivitis, unspecified eye: Secondary | ICD-10-CM | POA: Diagnosis not present

## 2016-03-02 DIAGNOSIS — J9610 Chronic respiratory failure, unspecified whether with hypoxia or hypercapnia: Secondary | ICD-10-CM | POA: Diagnosis not present

## 2016-03-02 DIAGNOSIS — H8110 Benign paroxysmal vertigo, unspecified ear: Secondary | ICD-10-CM | POA: Diagnosis not present

## 2016-03-02 DIAGNOSIS — R1032 Left lower quadrant pain: Secondary | ICD-10-CM | POA: Diagnosis not present

## 2016-03-02 DIAGNOSIS — Z6827 Body mass index (BMI) 27.0-27.9, adult: Secondary | ICD-10-CM | POA: Diagnosis not present

## 2016-03-17 DIAGNOSIS — H348322 Tributary (branch) retinal vein occlusion, left eye, stable: Secondary | ICD-10-CM | POA: Diagnosis not present

## 2016-04-18 DIAGNOSIS — E7800 Pure hypercholesterolemia, unspecified: Secondary | ICD-10-CM | POA: Diagnosis not present

## 2016-04-18 DIAGNOSIS — I10 Essential (primary) hypertension: Secondary | ICD-10-CM | POA: Diagnosis not present

## 2016-04-18 DIAGNOSIS — J9610 Chronic respiratory failure, unspecified whether with hypoxia or hypercapnia: Secondary | ICD-10-CM | POA: Diagnosis not present

## 2016-04-18 DIAGNOSIS — Z6827 Body mass index (BMI) 27.0-27.9, adult: Secondary | ICD-10-CM | POA: Diagnosis not present

## 2016-04-18 DIAGNOSIS — E119 Type 2 diabetes mellitus without complications: Secondary | ICD-10-CM | POA: Diagnosis not present

## 2016-04-18 DIAGNOSIS — F418 Other specified anxiety disorders: Secondary | ICD-10-CM | POA: Diagnosis not present

## 2016-04-18 DIAGNOSIS — I4891 Unspecified atrial fibrillation: Secondary | ICD-10-CM | POA: Diagnosis not present

## 2016-04-18 DIAGNOSIS — J449 Chronic obstructive pulmonary disease, unspecified: Secondary | ICD-10-CM | POA: Diagnosis not present

## 2016-05-16 DIAGNOSIS — Z6827 Body mass index (BMI) 27.0-27.9, adult: Secondary | ICD-10-CM | POA: Diagnosis not present

## 2016-05-16 DIAGNOSIS — R21 Rash and other nonspecific skin eruption: Secondary | ICD-10-CM | POA: Diagnosis not present

## 2016-05-16 DIAGNOSIS — E663 Overweight: Secondary | ICD-10-CM | POA: Diagnosis not present

## 2016-07-02 DIAGNOSIS — I10 Essential (primary) hypertension: Secondary | ICD-10-CM | POA: Diagnosis not present

## 2016-07-02 DIAGNOSIS — F23 Brief psychotic disorder: Secondary | ICD-10-CM | POA: Diagnosis not present

## 2016-07-02 DIAGNOSIS — F419 Anxiety disorder, unspecified: Secondary | ICD-10-CM | POA: Diagnosis not present

## 2016-07-02 DIAGNOSIS — S81802A Unspecified open wound, left lower leg, initial encounter: Secondary | ICD-10-CM | POA: Diagnosis not present

## 2016-07-02 DIAGNOSIS — J449 Chronic obstructive pulmonary disease, unspecified: Secondary | ICD-10-CM | POA: Diagnosis not present

## 2016-07-02 DIAGNOSIS — Z9981 Dependence on supplemental oxygen: Secondary | ICD-10-CM | POA: Diagnosis not present

## 2016-07-02 DIAGNOSIS — I5021 Acute systolic (congestive) heart failure: Secondary | ICD-10-CM | POA: Diagnosis not present

## 2016-07-02 DIAGNOSIS — R0602 Shortness of breath: Secondary | ICD-10-CM | POA: Diagnosis not present

## 2016-07-05 DIAGNOSIS — Z09 Encounter for follow-up examination after completed treatment for conditions other than malignant neoplasm: Secondary | ICD-10-CM | POA: Diagnosis not present

## 2016-07-05 DIAGNOSIS — Z79899 Other long term (current) drug therapy: Secondary | ICD-10-CM | POA: Diagnosis not present

## 2016-07-05 DIAGNOSIS — Z9181 History of falling: Secondary | ICD-10-CM | POA: Diagnosis not present

## 2016-07-05 DIAGNOSIS — E119 Type 2 diabetes mellitus without complications: Secondary | ICD-10-CM | POA: Diagnosis not present

## 2016-07-05 DIAGNOSIS — S81802D Unspecified open wound, left lower leg, subsequent encounter: Secondary | ICD-10-CM | POA: Diagnosis not present

## 2016-07-05 DIAGNOSIS — I739 Peripheral vascular disease, unspecified: Secondary | ICD-10-CM | POA: Diagnosis not present

## 2016-07-05 DIAGNOSIS — J449 Chronic obstructive pulmonary disease, unspecified: Secondary | ICD-10-CM | POA: Diagnosis not present

## 2016-07-05 DIAGNOSIS — Z6827 Body mass index (BMI) 27.0-27.9, adult: Secondary | ICD-10-CM | POA: Diagnosis not present

## 2016-07-06 DIAGNOSIS — T447X5A Adverse effect of beta-adrenoreceptor antagonists, initial encounter: Secondary | ICD-10-CM | POA: Diagnosis not present

## 2016-07-06 DIAGNOSIS — L03116 Cellulitis of left lower limb: Secondary | ICD-10-CM | POA: Diagnosis not present

## 2016-07-06 DIAGNOSIS — T50904A Poisoning by unspecified drugs, medicaments and biological substances, undetermined, initial encounter: Secondary | ICD-10-CM | POA: Diagnosis not present

## 2016-07-06 DIAGNOSIS — T426X5A Adverse effect of other antiepileptic and sedative-hypnotic drugs, initial encounter: Secondary | ICD-10-CM | POA: Diagnosis not present

## 2016-07-06 DIAGNOSIS — R42 Dizziness and giddiness: Secondary | ICD-10-CM | POA: Diagnosis not present

## 2016-07-06 DIAGNOSIS — T503X5A Adverse effect of electrolytic, caloric and water-balance agents, initial encounter: Secondary | ICD-10-CM | POA: Diagnosis not present

## 2016-07-06 DIAGNOSIS — R001 Bradycardia, unspecified: Secondary | ICD-10-CM | POA: Diagnosis not present

## 2016-07-06 DIAGNOSIS — E7800 Pure hypercholesterolemia, unspecified: Secondary | ICD-10-CM | POA: Diagnosis not present

## 2016-07-06 DIAGNOSIS — T50995A Adverse effect of other drugs, medicaments and biological substances, initial encounter: Secondary | ICD-10-CM | POA: Diagnosis not present

## 2016-07-06 DIAGNOSIS — I4891 Unspecified atrial fibrillation: Secondary | ICD-10-CM | POA: Diagnosis not present

## 2016-07-06 DIAGNOSIS — I10 Essential (primary) hypertension: Secondary | ICD-10-CM | POA: Diagnosis not present

## 2016-07-06 DIAGNOSIS — S80812A Abrasion, left lower leg, initial encounter: Secondary | ICD-10-CM | POA: Diagnosis not present

## 2016-07-06 DIAGNOSIS — R531 Weakness: Secondary | ICD-10-CM | POA: Diagnosis not present

## 2016-07-06 DIAGNOSIS — T50901A Poisoning by unspecified drugs, medicaments and biological substances, accidental (unintentional), initial encounter: Secondary | ICD-10-CM | POA: Diagnosis not present

## 2016-07-06 DIAGNOSIS — Z9981 Dependence on supplemental oxygen: Secondary | ICD-10-CM | POA: Diagnosis not present

## 2016-07-12 DIAGNOSIS — S8990XA Unspecified injury of unspecified lower leg, initial encounter: Secondary | ICD-10-CM | POA: Diagnosis not present

## 2016-07-12 DIAGNOSIS — Z23 Encounter for immunization: Secondary | ICD-10-CM | POA: Diagnosis not present

## 2016-07-19 DIAGNOSIS — M5136 Other intervertebral disc degeneration, lumbar region: Secondary | ICD-10-CM | POA: Diagnosis not present

## 2016-07-19 DIAGNOSIS — M4726 Other spondylosis with radiculopathy, lumbar region: Secondary | ICD-10-CM | POA: Diagnosis not present

## 2016-07-24 ENCOUNTER — Emergency Department: Payer: Medicare Other

## 2016-07-24 ENCOUNTER — Emergency Department
Admission: EM | Admit: 2016-07-24 | Discharge: 2016-07-24 | Disposition: A | Discharge status: Home / Self Care | Payer: Medicare Other | Attending: Emergency Medicine | Admitting: Emergency Medicine

## 2016-07-24 ENCOUNTER — Encounter: Payer: Self-pay | Admitting: Emergency Medicine

## 2016-07-24 DIAGNOSIS — J45909 Unspecified asthma, uncomplicated: Secondary | ICD-10-CM | POA: Insufficient documentation

## 2016-07-24 DIAGNOSIS — W19XXXA Unspecified fall, initial encounter: Secondary | ICD-10-CM | POA: Diagnosis not present

## 2016-07-24 DIAGNOSIS — R1030 Lower abdominal pain, unspecified: Secondary | ICD-10-CM | POA: Insufficient documentation

## 2016-07-24 DIAGNOSIS — Y939 Activity, unspecified: Secondary | ICD-10-CM | POA: Diagnosis not present

## 2016-07-24 DIAGNOSIS — E119 Type 2 diabetes mellitus without complications: Secondary | ICD-10-CM | POA: Diagnosis not present

## 2016-07-24 DIAGNOSIS — Z7982 Long term (current) use of aspirin: Secondary | ICD-10-CM | POA: Insufficient documentation

## 2016-07-24 DIAGNOSIS — Y999 Unspecified external cause status: Secondary | ICD-10-CM | POA: Diagnosis not present

## 2016-07-24 DIAGNOSIS — J449 Chronic obstructive pulmonary disease, unspecified: Secondary | ICD-10-CM | POA: Insufficient documentation

## 2016-07-24 DIAGNOSIS — S3210XA Unspecified fracture of sacrum, initial encounter for closed fracture: Secondary | ICD-10-CM | POA: Diagnosis present

## 2016-07-24 DIAGNOSIS — I10 Essential (primary) hypertension: Secondary | ICD-10-CM | POA: Diagnosis not present

## 2016-07-24 DIAGNOSIS — M549 Dorsalgia, unspecified: Secondary | ICD-10-CM

## 2016-07-24 DIAGNOSIS — Y92018 Other place in single-family (private) house as the place of occurrence of the external cause: Secondary | ICD-10-CM | POA: Diagnosis not present

## 2016-07-24 DIAGNOSIS — Z79899 Other long term (current) drug therapy: Secondary | ICD-10-CM | POA: Diagnosis not present

## 2016-07-24 DIAGNOSIS — E039 Hypothyroidism, unspecified: Secondary | ICD-10-CM | POA: Insufficient documentation

## 2016-07-24 DIAGNOSIS — S90512A Abrasion, left ankle, initial encounter: Secondary | ICD-10-CM | POA: Insufficient documentation

## 2016-07-24 DIAGNOSIS — S3219XA Other fracture of sacrum, initial encounter for closed fracture: Secondary | ICD-10-CM | POA: Insufficient documentation

## 2016-07-24 DIAGNOSIS — Z87891 Personal history of nicotine dependence: Secondary | ICD-10-CM | POA: Insufficient documentation

## 2016-07-24 DIAGNOSIS — M545 Low back pain: Secondary | ICD-10-CM | POA: Diagnosis not present

## 2016-07-24 LAB — TROPONIN I: Troponin I: <0.03 ng/mL (ref <0.03)

## 2016-07-24 LAB — URINALYSIS COMPLETE WITH MICROSCOPIC (ARMC ONLY)
BILIRUBIN URINE: NEGATIVE
Bacteria, UA: NONE SEEN
Glucose, UA: 50 mg/dL — AB
HGB URINE DIPSTICK: NEGATIVE
KETONES UR: NEGATIVE mg/dL
LEUKOCYTES UA: NEGATIVE
NITRITE: NEGATIVE
PH: 8 (ref 5.0–8.0)
PROTEIN: NEGATIVE mg/dL
RBC / HPF: NONE SEEN RBC/hpf (ref 0–5)
SPECIFIC GRAVITY, URINE: 1.008 (ref 1.005–1.030)

## 2016-07-24 LAB — COMPREHENSIVE METABOLIC PANEL
ALT: 28 U/L (ref 14–54)
AST: 22 U/L (ref 15–41)
Albumin: 4.2 g/dL (ref 3.5–5.0)
Alkaline Phosphatase: 141 U/L — ABNORMAL HIGH (ref 38–126)
Anion gap: 9 (ref 5–15)
BILIRUBIN TOTAL: 1.1 mg/dL (ref 0.3–1.2)
BUN: 19 mg/dL (ref 6–20)
CHLORIDE: 99 mmol/L — AB (ref 101–111)
CO2: 30 mmol/L (ref 22–32)
CREATININE: 0.7 mg/dL (ref 0.44–1.00)
Calcium: 9.3 mg/dL (ref 8.9–10.3)
Glucose, Bld: 128 mg/dL — ABNORMAL HIGH (ref 65–99)
POTASSIUM: 3.7 mmol/L (ref 3.5–5.1)
Sodium: 138 mmol/L (ref 135–145)
TOTAL PROTEIN: 7.5 g/dL (ref 6.5–8.1)

## 2016-07-24 LAB — CBC
HCT: 39.4 % (ref 35.0–47.0)
Hemoglobin: 13.1 g/dL (ref 12.0–16.0)
MCH: 30.5 pg (ref 26.0–34.0)
MCHC: 33.1 g/dL (ref 32.0–36.0)
MCV: 92.2 fL (ref 80.0–100.0)
PLATELETS: 217 10*3/uL (ref 150–440)
RBC: 4.28 MIL/uL (ref 3.80–5.20)
RDW: 15.1 % — AB (ref 11.5–14.5)
WBC: 14.7 10*3/uL — AB (ref 3.6–11.0)

## 2016-07-24 LAB — LIPASE, BLOOD: Lipase: 19 U/L (ref 11–51)

## 2016-07-24 MED ORDER — SODIUM CHLORIDE 0.9 % IV BOLUS (SEPSIS)
500.0000 mL | Freq: Once | INTRAVENOUS | Status: AC
  Administered 2016-07-24: 500 mL via INTRAVENOUS

## 2016-07-24 MED ORDER — IOPAMIDOL (ISOVUE-300) INJECTION 61%
30.0000 mL | Freq: Once | INTRAVENOUS | Status: AC | PRN
  Administered 2016-07-24: 30 mL via ORAL

## 2016-07-24 MED ORDER — BACITRACIN ZINC 500 UNIT/GM EX OINT
TOPICAL_OINTMENT | CUTANEOUS | Status: AC
  Filled 2016-07-24: qty 0.9

## 2016-07-24 MED ORDER — IOPAMIDOL (ISOVUE-300) INJECTION 61%
75.0000 mL | Freq: Once | INTRAVENOUS | Status: AC | PRN
  Administered 2016-07-24: 75 mL via INTRAVENOUS

## 2016-07-24 MED ORDER — OXYCODONE-ACETAMINOPHEN 5-325 MG PO TABS: 1.0000 | ORAL_TABLET | Freq: Four times a day (QID) | ORAL | 0 refills | Status: DC | PRN

## 2016-07-24 MED ORDER — FENTANYL CITRATE (PF) 100 MCG/2ML IJ SOLN
12.5000 ug | Freq: Once | INTRAMUSCULAR | Status: AC
  Administered 2016-07-24: 12.5 ug via INTRAVENOUS
  Filled 2016-07-24: qty 2

## 2016-07-24 MED ORDER — BACITRACIN ZINC 500 UNIT/GM EX OINT
TOPICAL_OINTMENT | Freq: Once | CUTANEOUS | Status: AC
  Administered 2016-07-24: 1 via TOPICAL

## 2016-07-24 NOTE — ED Notes (Signed)
Bacitracin applied to left leg wound. Dressing applied

## 2016-07-24 NOTE — Consult Note (Addendum)
[facility] [place] NAME: Tracy Richards    MR#:  [id number]  DATE OF BIRTH:  13-Jun-1929  DATE OF ADMISSION:  07/24/2016  PRIMARY CARE PHYSICIAN: Cyndi Bender, PA-C   REQUESTING/REFERRING PHYSICIAN: [place]  CHIEF COMPLAINT:   Chief Complaint  Patient presents with  . Abdominal Pain  . Back Pain    HISTORY OF PRESENT ILLNESS:  Tracy Richards  is a 82 y.o. female with a known history of COPD, chronic respiratory failure on home oxygen, chronic atrial fibrillation who is presenting with back pain. She suffered a mechanical fall about 4 weeks ago since that time has been dealing with a right-sided back pain she's been taking Tylenol with some relief diagnosed with possible sciatica had a cortisone injection with no relief. Given persistence and slight worsening of symptoms present Hospital further workup and evaluation. In the emergency department she was diagnosed with acute right sacral fracture on MRI  PAST MEDICAL HISTORY:   Past Medical History:  Diagnosis Date  . Asthma   . Benign paroxysmal vertigo   . COPD (chronic obstructive pulmonary disease) ([place])   . Depression   . Diabetes ([place])   . GERD (gastroesophageal reflux disease)   . Gout   . Hyperlipidemia   . Hypertension   . Hypothyroid   . Osteoporosis   . Peripheral neuropathy ([place])   . PVD (peripheral vascular disease) ([place])    blochage in Right leg -- had stent placement  . Renal artery stenosis ([place])   . Spinal stenosis   . Spinal stenosis   . Unspecified hereditary and idiopathic peripheral neuropathy     PAST SURGICAL HISTOIRY:   Past Surgical History:  Procedure Laterality Date  . ABDOMINAL HYSTERECTOMY    . APPENDECTOMY    . BACK SURGERY    . CATARACT EXTRACTION W/PHACO    . ILIAC ARTERY STENT    . KIDNEY SURGERY     ballooned multiple times  . TONSILLECTOMY    . VEIN SURGERY     VEIN STRIPPED    SOCIAL HISTORY:   Social History  Substance Use  Topics  . Smoking status: Former Smoker    Packs/day: 0.25    Years: 20.00    Types: Cigarettes    Quit date: 10/03/1978  . Smokeless tobacco: Never Used  . Alcohol use No    FAMILY HISTORY:   Family History  Problem Relation Age of Onset  . Sudden death Father     unknown causes  . Hypertension Mother   . Sudden death Mother     unknown causes  . Kidney disease Brother     Brights disease  . Bone cancer Brother   . Ovarian cancer Daughter     DRUG ALLERGIES:   Allergies  Allergen Reactions  . Amlodipine Swelling  . Avelox [Moxifloxacin Hcl In Nacl] Other (See Comments)    G.I. Upset   . Carafate [Sucralfate] Itching    ulcers ulcers  . Celecoxib Other (See Comments)  . Diazepam Hives  . Doxazosin     Other reaction(s): Unknown  . Klonopin [Clonazepam] Hives  . Levofloxacin Cough    Choking Sensation  . Norvasc [Amlodipine Besylate] Swelling  . Pantoprazole Sodium Itching  . Ranitidine Itching  . Rofecoxib     Other reaction(s): Unknown  . Sulfa Antibiotics     Other reaction(s): Unknown Mouth Sores  . Sulfa Drugs Cross Reactors Other (See Comments)    Mouth Sores  .  Codeine Anxiety    Other reaction(s): Unknown    REVIEW OF SYSTEMS:  CONSTITUTIONAL: No fever, fatigue or weakness.  EYES: No blurred or double vision.  EARS, NOSE, AND THROAT: No tinnitus or ear pain.  RESPIRATORY: No cough, shortness of breath, wheezing or hemoptysis.  CARDIOVASCULAR: No chest pain, orthopnea, edema.  GASTROINTESTINAL: No nausea, vomiting, diarrhea or abdominal pain.  GENITOURINARY: No dysuria, hematuria.  ENDOCRINE: No polyuria, nocturia,  HEMATOLOGY: No anemia, easy bruising or bleeding SKIN: No rash or lesion. MUSCULOSKELETAL: Positive back pain otherwise No joint pain or arthritis.   NEUROLOGIC: No tingling, numbness, weakness.  PSYCHIATRY: No anxiety or depression.   MEDICATIONS AT HOME:   Prior to Admission medications   Medication Sig Start Date End Date  Taking? Authorizing Provider  acetaminophen (TYLENOL) 500 MG tablet Take 500 mg by mouth every 6 (six) hours as needed for moderate pain or fever.   Yes Historical Provider, MD  ALPRAZolam Duanne Moron) 0.5 MG tablet Take 0.5 mg by mouth 2 (two) times daily as needed for anxiety.   Yes Historical Provider, MD  aspirin EC 81 MG tablet Take 81 mg by mouth daily.   Yes Historical Provider, MD  atorvastatin (LIPITOR) 40 MG tablet Take 40 mg by mouth at bedtime.    Yes Historical Provider, MD  diltiazem (DILACOR XR) 180 MG 24 hr capsule Take 180 mg by mouth daily.   Yes Historical Provider, MD  Fluticasone-Salmeterol (ADVAIR DISKUS) 250-50 MCG/DOSE AEPB Inhale 1 puff into the lungs 2 (two) times daily. 03/16/15  Yes Tanda Rockers, MD  gabapentin (NEURONTIN) 600 MG tablet Take 600 mg by mouth 3 (three) times daily.   Yes Historical Provider, MD  levothyroxine (SYNTHROID, LEVOTHROID) 100 MCG tablet Take 100 mcg by mouth daily. 10/19/14  Yes Historical Provider, MD  losartan (COZAAR) 100 MG tablet Take 100 mg by mouth daily.   Yes Historical Provider, MD  meclizine (ANTIVERT) 32 MG tablet Take 32 mg by mouth 3 (three) times daily as needed for dizziness.   Yes Historical Provider, MD  metoprolol tartrate (LOPRESSOR) 25 MG tablet Take 25 mg by mouth 2 (two) times daily. 10/04/14  Yes Historical Provider, MD  pantoprazole (PROTONIX) 40 MG tablet Take 40 mg by mouth daily.   Yes Historical Provider, MD  potassium chloride (K-DUR) 10 MEQ tablet Take 10 mEq by mouth daily.   Yes Historical Provider, MD  predniSONE (DELTASONE) 10 MG tablet Take 10-50 mg by mouth daily. 50 mg daily for 2 days, then 40 mg daily for 2 days, then 30 mg daily for 2 days, then 20 mg daily for 2 days, then 10 mg daily for 2 days, then stop 07/20/16 07/30/16 Yes Historical Provider, MD  Tiotropium Bromide Monohydrate (SPIRIVA RESPIMAT) 2.5 MCG/ACT AERS Inhale 2 puffs into the lungs daily as needed. For wheezing/shortness of breath   Yes Historical  Provider, MD  torsemide (DEMADEX) 20 MG tablet Take 20 mg by mouth daily.    Yes Historical Provider, MD      VITAL SIGNS:  Blood pressure (!) 144/102, pulse 91, temperature 97.7 F (36.5 C), temperature source Oral, resp. rate 18, height 5' (1.524 m), weight 56.7 kg (125 lb), SpO2 98 %.  PHYSICAL EXAMINATION:  GENERAL:  82 y.o.-year-old patient lying in the bed with no acute distress.  EYES: Pupils equal, round, reactive to light and accommodation. No scleral icterus. Extraocular muscles intact.  HEENT: Head atraumatic, normocephalic. Oropharynx and nasopharynx clear.  NECK:  Supple, no jugular venous distention.  No thyroid enlargement, no tenderness.  LUNGS: Normal breath sounds bilaterally, no wheezing, rales,rhonchi or crepitation. No use of accessory muscles of respiration.  CARDIOVASCULAR: S1, S2 normal. No murmurs, rubs, or gallops.  ABDOMEN: Soft, nontender, nondistended. Bowel sounds present. No organomegaly or mass.  EXTREMITIES: No pedal edema, cyanosis, or clubbing.  NEUROLOGIC: Cranial nerves II through XII are intact. Muscle strength 5/5 in all extremities. Sensation intact. Gait not checked.  PSYCHIATRIC: The patient is alert and oriented x 3.  SKIN: No obvious rash, lesion, or ulcer.   LABORATORY PANEL:   CBC  Recent Labs Lab 07/24/16 0727  WBC 14.7*  HGB 13.1  HCT 39.4  PLT 217   ------------------------------------------------------------------------------------------------------------------  Chemistries   Recent Labs Lab 07/24/16 0727  NA 138  K 3.7  CL 99*  CO2 30  GLUCOSE 128*  BUN 19  CREATININE 0.70  CALCIUM 9.3  AST 22  ALT 28  ALKPHOS 141*  BILITOT 1.1   ------------------------------------------------------------------------------------------------------------------  Cardiac Enzymes  Recent Labs Lab 07/24/16 0727  TROPONINI <0.03    ------------------------------------------------------------------------------------------------------------------  RADIOLOGY:  Mr Lumbar Spine Wo Contrast  Result Date: 07/24/2016 CLINICAL DATA:  82 year old female with lower abdominal pain, weakness and back pain. Urinary frequency. Subsequent encounter. EXAM: MRI LUMBAR SPINE WITHOUT CONTRAST TECHNIQUE: Multiplanar, multisequence MR imaging of the lumbar spine was performed. No intravenous contrast was administered. COMPARISON:  None. FINDINGS: Segmentation: Last fully open disk space is labeled L5-S1. Present examination incorporates from T10-11 disc space through the lower sacrum. Alignment: Scoliosis lumbar spine convex left centered at the L4 level. Vertebrae:  Acute right sacral fracture Conus medullaris: Extends to the just below the T12-L1 disc space level and appears normal. Paraspinal and other soft tissues: Atherosclerotic changes aorta and iliac arteries. Disc levels: T10-11 through T12-L1 unremarkable. L1-2:  Mild facet bony overgrowth. L2-3: Facet bony overgrowth. Ligamentum flavum hypertrophy. Short pedicles. Bulge. Multifactorial severe spinal stenosis. L3-4: Prior surgery. Facet degenerative changes. 3 mm anterior slip L3. Bulge. Short pedicles. Multifactorial moderate spinal stenosis. Foraminal extension of disc greater on the left. Moderate foraminal narrowing greater on left with mild encroachment upon exiting left L3 nerve root. L4-5: Prior laminectomy. Facet degenerative changes. Prominent disc degeneration with disc space narrowing greater on the right. Bulge/ osteophyte with lateral extension greater to the right. Marked right-sided and moderate left-sided foraminal narrowing with encroachment upon exiting L4 nerve roots greater on the right. Marked left-sided and moderate to marked right-sided lateral recess stenosis. Mild thecal sac narrowing. L5-S1: Bilateral L5 pars defects. Bulge/broad-based protrusion with cephalad  extension. Foraminal extension greater on the right. Slight encroachment upon but not compression of the exiting right L5 nerve root. Mild right lateral recess narrowing. Thecal sac narrowed by prominent epidural fat. IMPRESSION: Acute right sacral fracture with edema/hemorrhage. L2-3 multifactorial severe spinal stenosis. L3-4 prior surgery. Multifactorial moderate spinal stenosis. Moderate foraminal narrowing greater on left with mild encroachment upon exiting left L3 nerve root. L4-5 prior laminectomy. Multifactorial marked right-sided and moderate left-sided foraminal narrowing with encroachment upon exiting L4 nerve roots greater on the right. Marked left-sided and moderate to marked right-sided lateral recess stenosis. Mild thecal sac narrowing. Bilateral L5 pars defects. L5-S1 bulge/broad-based protrusion. Foraminal extension greater on the right. Slight encroachment upon but not compression of the exiting right L5 nerve root. Mild right lateral recess narrowing. Thecal sac narrowed by prominent epidural fat. Please see above for further detail. Electronically Signed   By: Genia Del M.D.   On: 07/24/2016 12:53   Ct Abdomen  Pelvis W Contrast  Addendum Date: 07/24/2016   ADDENDUM REPORT: 07/24/2016 12:48 ADDENDUM: Upon further review and comparison with the current MRI of the lumbar spine there is an undisplaced right sacral fracture identified. Electronically Signed   By: Inez Catalina M.D.   On: 07/24/2016 12:48   Result Date: 07/24/2016 CLINICAL DATA:  Lower abdominal pain for several days EXAM: CT ABDOMEN AND PELVIS WITH CONTRAST TECHNIQUE: Multidetector CT imaging of the abdomen and pelvis was performed using the standard protocol following bolus administration of intravenous contrast. CONTRAST:  76mL ISOVUE-300 IOPAMIDOL (ISOVUE-300) INJECTION 61% COMPARISON:  04/20/2015 FINDINGS: Lower chest: Minimal left basilar atelectasis is noted. Hepatobiliary: No focal liver abnormality is seen. No  gallstones, gallbladder wall thickening, or biliary dilatation. Pancreas: Unremarkable. No pancreatic ductal dilatation or surrounding inflammatory changes. Spleen: Normal in size without focal abnormality. Adrenals/Urinary Tract: Adrenal glands are unremarkable. Kidneys are normal, without renal calculi, focal lesion, or hydronephrosis. Bladder is unremarkable. Stomach/Bowel: The appendix has been surgically removed. No obstructive changes are seen. Scattered mild diverticular changes noted without diverticulitis. Vascular/Lymphatic: Aortic atherosclerosis. No enlarged abdominal or pelvic lymph nodes. Reproductive: Status post hysterectomy. No adnexal masses. Other: No abdominal wall hernia or abnormality. No abdominopelvic ascites. Musculoskeletal: Degenerative change of the lumbar spine is noted. IMPRESSION: Chronic changes without acute abnormality. Electronically Signed: By: Inez Catalina M.D. On: 07/24/2016 09:38    EKG:   Orders placed or performed during the hospital encounter of 07/24/16  . ED EKG  . ED EKG  . EKG 12-Lead  . EKG 12-Lead  . EKG 12-Lead  . EKG 12-Lead  . ED EKG  . ED EKG    IMPRESSION AND PLAN:   82 year old Caucasian female history of COPD presenting with back pain  1. Acute sacral fracture: Would recommend outpatient PT, low dose pain medication. Patient has a walker at home She has not required any doses of pain medication thus far in emergency department  2. COPD chronic respiratory failure 3. Atrial fibrillation chronic 4. Hypothyroidism unspecified  Added face to face home heath/pt eval  All the records are reviewed and case discussed with Consulting provider. Management plans discussed with the patient, family and they are in agreement.  CODE STATUS: Full  TOTAL TIME TAKING CARE OF THIS PATIENT: 65 minutes.    Hower,  Karenann Cai.D on 07/24/2016 at 2:50 PM  Between 7am to 6pm - Pager - [phone]  After 6pm: House Pager: - [facility]  Office  [phone]  CC: Primary care Physician: Cyndi Bender, PA-C

## 2016-07-24 NOTE — ED Notes (Signed)
Called MRI tech , Rolla Plate, informed him of MRI ordered [id number]

## 2016-07-24 NOTE — ED Triage Notes (Signed)
First Nurse: Patient assisted out of vehicle. Recommended 2 person assist for ambulation. Family states patient is developing generalized weakness with suprapubic pain. Family concerned for UTI

## 2016-07-24 NOTE — ED Notes (Signed)
AAOx3.  Skin warm and dry.  NAD 

## 2016-07-24 NOTE — ED Triage Notes (Signed)
C/O lower abdominal pain, weakness, and back pain.  Seen by PCP on Monday for back pain and was given prednisone for possible sciatic pain.  C/O lower abdominal pain and urinary frequency for 3-4 days.

## 2016-07-24 NOTE — Progress Notes (Addendum)
Ok to send patient home -- with some pain meds Can get PT eval at home  No criteria for admission Discussed at length with patient and daughter -- agree with plan Full note to follow  Patient has walker and cane at home

## 2016-07-24 NOTE — ED Notes (Signed)
Returned from MRI, NAD. On monitor. No needs.

## 2016-07-24 NOTE — ED Notes (Signed)
Assisted to bedpan

## 2016-07-24 NOTE — ED Provider Notes (Addendum)
Tracy Richards Provider Note   ____________________________________________   First MD Initiated Contact with Patient 07/24/16 [phone]     (approximate)  I have reviewed the triage vital signs and the nursing notes.   HISTORY  Chief Complaint Abdominal Pain and Back Pain   HPI Tracy Richards is a 82 y.o. female with a history of hypertension as well as spinal stenosis was present to the emergency Richards today with lower extremity weakness as well as lower abdominal pain. She describes lower abdominal pain as a soreness that is a 6 out of 10. She denies any nausea vomiting or diarrhea. Says the pain is worse when she moves that she also has a known hernia to the left lower quadrant. She was also recently placed on a steroid taper this past Tuesday for presumed right-sided sciatica. She is still complaining of the back pain to the right side which is radiating down to her hip. Despite taking the steroid says that her symptoms have not improved. She is also now developed urinary frequency with burning. The patient denies any numbness to her bilateral lower extremities. Does not report any loss of bowel or bladder continence.   Past Medical History:  Diagnosis Date  . Asthma   . Benign paroxysmal vertigo   . COPD (chronic obstructive pulmonary disease) ([place])   . Depression   . Diabetes ([place])   . GERD (gastroesophageal reflux disease)   . Gout   . Hyperlipidemia   . Hypertension   . Hypothyroid   . Osteoporosis   . Peripheral neuropathy ([place])   . PVD (peripheral vascular disease) ([place])    blochage in Right leg -- had stent placement  . Renal artery stenosis ([place])   . Spinal stenosis   . Spinal stenosis   . Unspecified hereditary and idiopathic peripheral neuropathy     Patient Active Problem List   Diagnosis Date Noted  . Chronic respiratory failure with hypoxia ([place]) 03/13/2015  . COPD bronchitis 10/20/2014  . Dyspnea 04/01/2013    . Other specified hypothyroidism 04/01/2013  . Cough 03/19/2013  . HBP (high blood pressure) 03/19/2013    Past Surgical History:  Procedure Laterality Date  . ABDOMINAL HYSTERECTOMY    . APPENDECTOMY    . BACK SURGERY    . CATARACT EXTRACTION W/PHACO    . ILIAC ARTERY STENT    . KIDNEY SURGERY     ballooned multiple times  . TONSILLECTOMY    . VEIN SURGERY     VEIN STRIPPED    Prior to Admission medications   Medication Sig Start Date End Date Taking? Authorizing Provider  ALPRAZolam Duanne Moron) 0.5 MG tablet Take 0.5 mg by mouth 2 (two) times daily as needed for anxiety.    Historical Provider, MD  aspirin 81 MG tablet Take 81 mg by mouth daily.    Historical Provider, MD  atorvastatin (LIPITOR) 40 MG tablet Take 40 mg by mouth daily.    Historical Provider, MD  diltiazem (CARDIZEM CD) 120 MG 24 hr capsule Take 120 mg by mouth daily. 10/16/14   Historical Provider, MD  Fluticasone-Salmeterol (ADVAIR DISKUS) 250-50 MCG/DOSE AEPB Inhale 1 puff into the lungs 2 (two) times daily. 03/16/15   Tanda Rockers, MD  gabapentin (NEURONTIN) 300 MG capsule Take 600 mg by mouth 3 (three) times daily.    Historical Provider, MD  levothyroxine (SYNTHROID, LEVOTHROID) 100 MCG tablet Take 100 mcg by mouth daily. 10/19/14   Historical Provider, MD  losartan (COZAAR)  100 MG tablet Take 100 mg by mouth daily.    Historical Provider, MD  meclizine (ANTIVERT) 25 MG tablet Take 25 mg by mouth as needed.    Historical Provider, MD  metoprolol tartrate (LOPRESSOR) 25 MG tablet Take 25 mg by mouth 2 (two) times daily. 10/04/14   Historical Provider, MD  pantoprazole (PROTONIX) 40 MG tablet TAKE 1 TABLET BY MOUTH EVERY DAY . TAKE 30-60 MINUTES BEFORE MEAL OF THE DAY Patient taking differently: 1 tablet bid 06/13/13   Tanda Rockers, MD  potassium chloride (K-DUR) 10 MEQ tablet Take 10 mEq by mouth daily.    Historical Provider, MD  SPIRIVA RESPIMAT 2.5 MCG/ACT AERS INHALE 2 PUFFS INTO THE LUNGS DAILY. 12/21/15    Tanda Rockers, MD  torsemide (DEMADEX) 20 MG tablet Take 40 mg by mouth daily.     Historical Provider, MD    Allergies Amlodipine; Avelox [moxifloxacin hcl in nacl]; Carafate [sucralfate]; Celecoxib; Diazepam; Doxazosin; Klonopin [clonazepam]; Levofloxacin; Norvasc [amlodipine besylate]; Pantoprazole sodium; Ranitidine; Rofecoxib; Sulfa antibiotics; Sulfa drugs cross reactors; and Codeine  Family History  Problem Relation Age of Onset  . Sudden death Father     unknown causes  . Hypertension Mother   . Sudden death Mother     unknown causes  . Kidney disease Brother     Brights disease  . Bone cancer Brother   . Ovarian cancer Daughter     Social History Social History  Substance Use Topics  . Smoking status: Former Smoker    Packs/day: 0.25    Years: 20.00    Types: Cigarettes    Quit date: 10/03/1978  . Smokeless tobacco: Never Used  . Alcohol use No    Review of Systems Constitutional: No fever/chills Eyes: No visual changes. ENT: No sore throat. Cardiovascular: Denies chest pain. Respiratory: Denies shortness of breath. Gastrointestinal:  No nausea, no vomiting.  No diarrhea.  No constipation. Genitourinary: As above Musculoskeletal: Right-sided lumbar pain. Skin: Negative for rash. Neurological: Negative for headaches, focal weakness or numbness.  10-point ROS otherwise negative.  ____________________________________________   PHYSICAL EXAM:  VITAL SIGNS: ED Triage Vitals  Enc Vitals Group     BP 07/24/16 0717 (!) 166/73     Pulse Rate 07/24/16 0717 78     Resp 07/24/16 0717 16     Temp 07/24/16 0717 97.7 F (36.5 C)     Temp Source 07/24/16 0717 Oral     SpO2 07/24/16 0717 94 %     Weight 07/24/16 0717 125 lb (56.7 kg)     Height 07/24/16 0717 5' (1.524 m)     Head Circumference --      Peak Flow --      Pain Score 07/24/16 0718 10     Pain Loc --      Pain Edu? --      Excl. in [place]? --     Constitutional: Alert and oriented. Well appearing  and in no acute distress. Eyes: Conjunctivae are normal. PERRL. EOMI. Head: Atraumatic. Nose: No congestion/rhinnorhea. Mouth/Throat: Mucous membranes are moist.   Neck: No stridor.   Cardiovascular: Normal rate, regular rhythm. Grossly normal heart sounds.   Respiratory: Normal respiratory effort.  No retractions. Lungs CTAB. Gastrointestinal: Soft with mild tenderness to palpation across lower abdomen. Soft, 3-4 cinnamon or hernia sac to the left lower quadrant which is easily reducible. No distention. No CVA tenderness. Musculoskeletal: No lower extremity tenderness to palpation. Mild tenderness to palpation over the soft tissue of the  right lumbar region. No lower extremity edema. Dorsalis pedis pulses are present and equal bilaterally. 5 out of 5 strength to the bilateral lower extremities. No saddle anesthesia. Neurologic:  Normal speech and language. No gross focal neurologic deficits are appreciated. Skin:  Skin is warm, dry.  Abrasion to the left lateral calf about 2 x 3 cm. No surrounding induration or pus. Psychiatric: Mood and affect are normal. Speech and behavior are normal.  ____________________________________________   LABS (all labs ordered are listed, but only abnormal results are displayed)  Labs Reviewed  COMPREHENSIVE METABOLIC PANEL - Abnormal; Notable for the following:       Result Value   Chloride 99 (*)    Glucose, Bld 128 (*)    Alkaline Phosphatase 141 (*)    All other components within normal limits  CBC - Abnormal; Notable for the following:    WBC 14.7 (*)    RDW 15.1 (*)    All other components within normal limits  URINALYSIS COMPLETEWITH MICROSCOPIC ([facility] ONLY) - Abnormal; Notable for the following:    Color, Urine STRAW (*)    APPearance CLEAR (*)    Glucose, UA 50 (*)    Squamous Epithelial / LPF 0-5 (*)    All other components within normal limits  LIPASE, BLOOD  TROPONIN I   ____________________________________________  EKG  ED ECG  REPORT I, Doran Stabler, the attending physician, personally viewed and interpreted this ECG.   Date: 07/24/2016  EKG Time: 0822  Rate: 72  Rhythm: atrial fibrillation, rate 72  Axis: Normal  Intervals:none  ST&T Change: No ST segment elevation or depression. No abnormal T-wave inversion.  Patient with history of atrial fibrillation that is well documented by her cardiologist, Dr.  Saralyn Pilar.    ____________________________________________  RADIOLOGY  CT Abdomen Pelvis W Contrast (Accession [id number]) (Order [id number])  Imaging  Date: 07/24/2016 [facility]: [facility] EMERGENCY Richards Released By/Authorizing: Orbie Pyo, MD (auto-released)  Exam Information   Status Exam Begun  Exam Ended   Final [99] 07/24/2016 9:01 AM 07/24/2016 9:18 AM  PACS Images   Show images for CT Abdomen Pelvis W Contrast  Addendum   ADDENDUM REPORT: 07/24/2016 12:48  ADDENDUM: Upon further review and comparison with the current MRI of the lumbar spine there is an undisplaced right sacral fracture identified.   Electronically Signed   By: Inez Catalina M.D.   On: 07/24/2016 12:48   Addended by Inez Catalina, MD on 07/24/2016 12:50 PM    Study Result   CLINICAL DATA:  Lower abdominal pain for several days  EXAM: CT ABDOMEN AND PELVIS WITH CONTRAST  TECHNIQUE: Multidetector CT imaging of the abdomen and pelvis was performed using the standard protocol following bolus administration of intravenous contrast.  CONTRAST:  33mL ISOVUE-300 IOPAMIDOL (ISOVUE-300) INJECTION 61%  COMPARISON:  04/20/2015  FINDINGS: Lower chest: Minimal left basilar atelectasis is noted.  Hepatobiliary: No focal liver abnormality is seen. No gallstones, gallbladder wall thickening, or biliary dilatation.  Pancreas: Unremarkable. No pancreatic ductal dilatation or surrounding inflammatory changes.  Spleen: Normal in size without focal  abnormality.  Adrenals/Urinary Tract: Adrenal glands are unremarkable. Kidneys are normal, without renal calculi, focal lesion, or hydronephrosis. Bladder is unremarkable.  Stomach/Bowel: The appendix has been surgically removed. No obstructive changes are seen. Scattered mild diverticular changes noted without diverticulitis.  Vascular/Lymphatic: Aortic atherosclerosis. No enlarged abdominal or pelvic lymph nodes.  Reproductive: Status post hysterectomy. No adnexal masses.  Other: No abdominal wall hernia or abnormality.  No abdominopelvic ascites.  Musculoskeletal: Degenerative change of the lumbar spine is noted.  IMPRESSION: Chronic changes without acute abnormality.  Electronically Signed: By: Inez Catalina M.D. On: 07/24/2016 09:38      MR LUMBAR SPINE WO CONTRAST (Accession [id number]) (Order [id number])  Imaging  Date: 07/24/2016 [facility]: [facility] EMERGENCY Richards Released By/Authorizing: Orbie Pyo, MD (auto-released)  Exam Information   Status Exam Begun  Exam Ended   Final [99] 07/24/2016 12:06 PM 07/24/2016 12:17 PM  PACS Images   Show images for MR LUMBAR SPINE WO CONTRAST  Study Result   CLINICAL DATA:  82 year old female with lower abdominal pain, weakness and back pain. Urinary frequency. Subsequent encounter.  EXAM: MRI LUMBAR SPINE WITHOUT CONTRAST  TECHNIQUE: Multiplanar, multisequence MR imaging of the lumbar spine was performed. No intravenous contrast was administered.  COMPARISON:  None.  FINDINGS: Segmentation: Last fully open disk space is labeled L5-S1. Present examination incorporates from T10-11 disc space through the lower sacrum.  Alignment: Scoliosis lumbar spine convex left centered at the L4 level.  Vertebrae:  Acute right sacral fracture  Conus medullaris: Extends to the just below the T12-L1 disc space level and appears normal.  Paraspinal and other soft  tissues: Atherosclerotic changes aorta and iliac arteries.  Disc levels:  T10-11 through T12-L1 unremarkable.  L1-2:  Mild facet bony overgrowth.  L2-3: Facet bony overgrowth. Ligamentum flavum hypertrophy. Short pedicles. Bulge. Multifactorial severe spinal stenosis.  L3-4: Prior surgery. Facet degenerative changes. 3 mm anterior slip L3. Bulge. Short pedicles. Multifactorial moderate spinal stenosis. Foraminal extension of disc greater on the left. Moderate foraminal narrowing greater on left with mild encroachment upon exiting left L3 nerve root.  L4-5: Prior laminectomy. Facet degenerative changes. Prominent disc degeneration with disc space narrowing greater on the right. Bulge/ osteophyte with lateral extension greater to the right. Marked right-sided and moderate left-sided foraminal narrowing with encroachment upon exiting L4 nerve roots greater on the right. Marked left-sided and moderate to marked right-sided lateral recess stenosis. Mild thecal sac narrowing.  L5-S1: Bilateral L5 pars defects. Bulge/broad-based protrusion with cephalad extension. Foraminal extension greater on the right. Slight encroachment upon but not compression of the exiting right L5 nerve root. Mild right lateral recess narrowing. Thecal sac narrowed by prominent epidural fat.  IMPRESSION: Acute right sacral fracture with edema/hemorrhage.  L2-3 multifactorial severe spinal stenosis.  L3-4 prior surgery. Multifactorial moderate spinal stenosis. Moderate foraminal narrowing greater on left with mild encroachment upon exiting left L3 nerve root.  L4-5 prior laminectomy. Multifactorial marked right-sided and moderate left-sided foraminal narrowing with encroachment upon exiting L4 nerve roots greater on the right. Marked left-sided and moderate to marked right-sided lateral recess stenosis. Mild thecal sac narrowing.  Bilateral L5 pars defects. L5-S1 bulge/broad-based  protrusion. Foraminal extension greater on the right. Slight encroachment upon but not compression of the exiting right L5 nerve root. Mild right lateral recess narrowing. Thecal sac narrowed by prominent epidural fat.  Please see above for further detail.   Electronically Signed   By: Genia Del M.D.   On: 07/24/2016 12:53    ____________________________________________   PROCEDURES  Procedure(s) performed:   Procedures  Critical Care performed:   ____________________________________________   INITIAL IMPRESSION / ASSESSMENT AND PLAN / ED COURSE  Pertinent labs & imaging results that were available during my care of the patient were reviewed by me and considered in my medical decision making (see chart for details).  ----------------------------------------- 1:44 PM on 07/24/2016 -----------------------------------------  Patient resting comfortably  at this time in bed. Prior to the MRI, I had attempted to walk the patient and she was able to stand but not take any steps. Upon discovery of the right-sided sacral fracture, I asked the patient and daughter again about any recent falls and the most recent was 3 weeks ago when she had fallen in her home on her bottom and also scraped her left lateral calf.  Discussed the fracture with Dr. Sabra Heck and he says that he will see the patient in consult. The patient will be admitted to the medicine service. I explained the imaging findings with the family as well as the patient and they're understanding of the plan and willing to comply. Patient also with spinal stenosis but acute injury is most likely the cause for the patient's current complaints.  Clinical Course     ____________________________________________   FINAL CLINICAL IMPRESSION(S) / ED DIAGNOSES  Acute sacral fracture.    NEW MEDICATIONS STARTED DURING THIS VISIT:  New Prescriptions   No medications on file     Note:  This document was prepared  using Dragon voice recognition software and may include unintentional dictation errors.    Orbie Pyo, MD 07/24/16 1347  Patient and family would rather be discharged to home because of insurance not covering the admission. They have a walker already at home. Home health process was initiated in consort with the medicine service as well as care management. The patient and family are aware that home health will be calling them to arrange visits.  Patient says that she has tolerated Percocet in the past without adverse effect. We'll give prescription for Percocet for home use. Also discussed this with Dr. Sabra Heck orthopedics and he had a recommendation that the patient be "bed to chair." I discussed this with the patient as well as the family who understand that the patient should be out of her bed at least 2-3 times a day and sitting in a chair. The patient will follow up with Dr. Marry Guan, who she has seen before as an outpatient.      Orbie Pyo, MD 07/24/16 [phone]

## 2016-07-24 NOTE — ED Notes (Signed)
Admitting MD at bedside.

## 2016-07-24 NOTE — Care Management Note (Signed)
Case Management Note  Patient Details  Name: ROLONDA EBRIGHT MRN: [id number] Date of Birth: [birth year]  Subjective/Objective:      Currently active with [facility] .               Action/Plan:wishes to continue services w [facility]   Expected Discharge Date:                  Expected Discharge Plan:     In-House Referral:     Discharge planning Services     Post Acute Care Choice:    Choice offered to:     DME Arranged:    DME Agency:     HH Arranged: PT/OT/ Home health aide   [facility] Agency:   [place]  Status of Service:     If discussed at Secretary of Stay Meetings, dates discussed:    Additional Comments:Patient and daughter in agreement with plan. Information faxed to [facility].  Ival Bible, RN 07/24/2016, 3:51 PM

## 2016-07-24 NOTE — ED Notes (Signed)
Patient transported to CT 

## 2016-07-25 ENCOUNTER — Emergency Department
Admission: EM | Admit: 2016-07-25 | Discharge: 2016-07-26 | Disposition: A | Discharge status: Home / Self Care | Payer: Medicare Other | Attending: Emergency Medicine | Admitting: Emergency Medicine

## 2016-07-25 ENCOUNTER — Encounter: Payer: Self-pay | Admitting: Emergency Medicine

## 2016-07-25 DIAGNOSIS — S3210XA Unspecified fracture of sacrum, initial encounter for closed fracture: Secondary | ICD-10-CM | POA: Diagnosis not present

## 2016-07-25 DIAGNOSIS — S34139A Unspecified injury to sacral spinal cord, initial encounter: Secondary | ICD-10-CM | POA: Diagnosis present

## 2016-07-25 DIAGNOSIS — E119 Type 2 diabetes mellitus without complications: Secondary | ICD-10-CM | POA: Insufficient documentation

## 2016-07-25 DIAGNOSIS — X58XXXA Exposure to other specified factors, initial encounter: Secondary | ICD-10-CM | POA: Diagnosis not present

## 2016-07-25 DIAGNOSIS — Z9181 History of falling: Secondary | ICD-10-CM

## 2016-07-25 DIAGNOSIS — E039 Hypothyroidism, unspecified: Secondary | ICD-10-CM | POA: Diagnosis not present

## 2016-07-25 DIAGNOSIS — Z7982 Long term (current) use of aspirin: Secondary | ICD-10-CM | POA: Insufficient documentation

## 2016-07-25 DIAGNOSIS — I10 Essential (primary) hypertension: Secondary | ICD-10-CM | POA: Diagnosis not present

## 2016-07-25 DIAGNOSIS — R1030 Lower abdominal pain, unspecified: Secondary | ICD-10-CM | POA: Diagnosis not present

## 2016-07-25 DIAGNOSIS — Z87891 Personal history of nicotine dependence: Secondary | ICD-10-CM | POA: Diagnosis not present

## 2016-07-25 DIAGNOSIS — Y939 Activity, unspecified: Secondary | ICD-10-CM | POA: Insufficient documentation

## 2016-07-25 DIAGNOSIS — Z7984 Long term (current) use of oral hypoglycemic drugs: Secondary | ICD-10-CM | POA: Insufficient documentation

## 2016-07-25 DIAGNOSIS — J449 Chronic obstructive pulmonary disease, unspecified: Secondary | ICD-10-CM | POA: Insufficient documentation

## 2016-07-25 DIAGNOSIS — Z79899 Other long term (current) drug therapy: Secondary | ICD-10-CM | POA: Insufficient documentation

## 2016-07-25 DIAGNOSIS — Y929 Unspecified place or not applicable: Secondary | ICD-10-CM | POA: Insufficient documentation

## 2016-07-25 DIAGNOSIS — Y999 Unspecified external cause status: Secondary | ICD-10-CM | POA: Diagnosis not present

## 2016-07-25 DIAGNOSIS — J45909 Unspecified asthma, uncomplicated: Secondary | ICD-10-CM | POA: Diagnosis not present

## 2016-07-25 DIAGNOSIS — M7918 Myalgia, other site: Secondary | ICD-10-CM

## 2016-07-25 MED ORDER — FLUTICASONE FUROATE-VILANTEROL 200-25 MCG/INH IN AEPB
1.0000 | INHALATION_SPRAY | Freq: Every day | RESPIRATORY_TRACT | Status: DC
  Administered 2016-07-25: 1 via RESPIRATORY_TRACT
  Filled 2016-07-25: qty 28

## 2016-07-25 MED ORDER — POTASSIUM CHLORIDE ER 10 MEQ PO TBCR
10.0000 meq | EXTENDED_RELEASE_TABLET | Freq: Every day | ORAL | Status: DC
  Administered 2016-07-25 – 2016-07-26 (×2): 10 meq via ORAL
  Filled 2016-07-25: qty 1

## 2016-07-25 MED ORDER — PANTOPRAZOLE SODIUM 40 MG PO TBEC
40.0000 mg | DELAYED_RELEASE_TABLET | Freq: Every day | ORAL | Status: DC
  Administered 2016-07-25 – 2016-07-26 (×2): 40 mg via ORAL
  Filled 2016-07-25 (×2): qty 1

## 2016-07-25 MED ORDER — POTASSIUM CHLORIDE CRYS ER 20 MEQ PO TBCR
EXTENDED_RELEASE_TABLET | ORAL | Status: AC
  Administered 2016-07-25: 10 meq via ORAL
  Filled 2016-07-25: qty 1

## 2016-07-25 MED ORDER — DILTIAZEM HCL ER 180 MG PO CP24
180.0000 mg | ORAL_CAPSULE | Freq: Every day | ORAL | Status: DC
  Administered 2016-07-26: 180 mg via ORAL
  Filled 2016-07-25: qty 1

## 2016-07-25 MED ORDER — LEVOTHYROXINE SODIUM 50 MCG PO TABS
100.0000 ug | ORAL_TABLET | Freq: Every day | ORAL | Status: DC
  Administered 2016-07-26: 100 ug via ORAL
  Filled 2016-07-25: qty 2

## 2016-07-25 MED ORDER — LOSARTAN POTASSIUM 50 MG PO TABS: 100.0000 mg | ORAL_TABLET | Freq: Every day | ORAL | Status: DC

## 2016-07-25 MED ORDER — ACETAMINOPHEN 500 MG PO TABS
1000.0000 mg | ORAL_TABLET | Freq: Three times a day (TID) | ORAL | Status: DC
  Administered 2016-07-25: 1000 mg via ORAL
  Filled 2016-07-25: qty 2

## 2016-07-25 MED ORDER — MECLIZINE HCL 25 MG PO TABS
31.2500 mg | ORAL_TABLET | Freq: Three times a day (TID) | ORAL | Status: DC | PRN
  Filled 2016-07-25: qty 0.5

## 2016-07-25 MED ORDER — GABAPENTIN 600 MG PO TABS
600.0000 mg | ORAL_TABLET | Freq: Three times a day (TID) | ORAL | Status: DC
  Administered 2016-07-25 – 2016-07-26 (×3): 600 mg via ORAL
  Filled 2016-07-25 (×3): qty 1

## 2016-07-25 MED ORDER — BACITRACIN ZINC 500 UNIT/GM EX OINT
TOPICAL_OINTMENT | CUTANEOUS | Status: AC
Start: 2016-07-25 — End: 2016-07-25
  Administered 2016-07-25: 1 via TOPICAL
  Filled 2016-07-25: qty 0.9

## 2016-07-25 MED ORDER — METOPROLOL TARTRATE 25 MG PO TABS
25.0000 mg | ORAL_TABLET | Freq: Two times a day (BID) | ORAL | Status: DC
  Administered 2016-07-26: 25 mg via ORAL
  Filled 2016-07-25: qty 1

## 2016-07-25 MED ORDER — MORPHINE SULFATE (PF) 2 MG/ML IV SOLN
2.0000 mg | Freq: Once | INTRAVENOUS | Status: AC
  Administered 2016-07-25: 2 mg via INTRAVENOUS
  Filled 2016-07-25: qty 1

## 2016-07-25 MED ORDER — METFORMIN HCL ER 500 MG PO TB24
1000.0000 mg | ORAL_TABLET | Freq: Every day | ORAL | Status: DC
  Administered 2016-07-25: 1000 mg via ORAL
  Filled 2016-07-25 (×4): qty 2

## 2016-07-25 MED ORDER — DILTIAZEM HCL ER 180 MG PO CP24: 180.0000 mg | ORAL_CAPSULE | Freq: Every day | ORAL | Status: DC

## 2016-07-25 MED ORDER — ACETAMINOPHEN 500 MG PO TABS
1000.0000 mg | ORAL_TABLET | Freq: Once | ORAL | Status: AC
  Administered 2016-07-25: 1000 mg via ORAL
  Filled 2016-07-25: qty 2

## 2016-07-25 MED ORDER — BACITRACIN ZINC 500 UNIT/GM EX OINT
TOPICAL_OINTMENT | Freq: Two times a day (BID) | CUTANEOUS | Status: DC
  Administered 2016-07-25: 1 via TOPICAL
  Administered 2016-07-26: 10:00:00 via TOPICAL
  Filled 2016-07-25: qty 0.9

## 2016-07-25 MED ORDER — ATORVASTATIN CALCIUM 20 MG PO TABS
40.0000 mg | ORAL_TABLET | Freq: Every day | ORAL | Status: DC
  Administered 2016-07-25: 40 mg via ORAL
  Filled 2016-07-25: qty 2

## 2016-07-25 MED ORDER — SENNA 8.6 MG PO TABS
1.0000 | ORAL_TABLET | Freq: Every day | ORAL | Status: DC
  Administered 2016-07-26: 8.6 mg via ORAL
  Filled 2016-07-25: qty 1

## 2016-07-25 MED ORDER — LEVOTHYROXINE SODIUM 50 MCG PO TABS: 100.0000 ug | ORAL_TABLET | Freq: Every day | ORAL | Status: DC

## 2016-07-25 MED ORDER — OXYCODONE HCL 5 MG PO TABS: 5.0000 mg | ORAL_TABLET | ORAL | Status: DC | PRN

## 2016-07-25 MED ORDER — METFORMIN HCL 500 MG PO TABS
ORAL_TABLET | ORAL | Status: AC
  Filled 2016-07-25: qty 2

## 2016-07-25 NOTE — Clinical Social Work Note (Signed)
Clinical Social Work Assessment  Patient Details  Name: Tracy Richards MRN: [id number] Date of Birth: Feb 05, 1929  Date of referral:  07/25/16               Reason for consult:  Facility Placement                Permission sought to share information with:  Family Supports, Chartered certified accountant granted to share information::  Yes, Verbal Permission Granted  Name::        Agency::     Relationship::     Contact Information:     Housing/Transportation Living arrangements for the past 2 months:  Single Family Home Source of Information:  Patient, Adult Children Patient Interpreter Needed:  None Criminal Activity/Legal Involvement Pertinent to Current Situation/Hospitalization:  No - Comment as needed Significant Relationships:  Adult Children Lives with:  Self Do you feel safe going back to the place where you live?  No Need for family participation in patient care:  Yes (Comment)  Care giving concerns: Pt is currently unable to care for herself independently.   Social Worker assessment / plan:  CSW received consult for possible SNF placement. CSW engaged with pt at her bedside with two of pt's daughters present at the time of the assessment. CSW introduced herself and her role as a Education officer, museum. Pt is currently dependent with mobility and PT has recommended SNF placement. CSW explained that Medicare would require a 3 day inpt stay for SNF to be covered. 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 CSW has completed FL2 via the Hub and sent out referrals to local facilities. The family has a preference for [facility] and [facility]. CSW called [employer] to see if they would be able to cover SNF stay even though they are secondary insurance. Awaiting call back and will check back again in the morning. CSW has explained that if BCBS will not cover the SNF stay the family does have the option to pay privately. Pt's family has expressed that would be difficult for  them to do. As a back-up plan, CSW has contacted Consulting civil engineer about [facility] and OT. CSW will monitor the Hub for referrals and present bed offers to the family when appropriate.   Employment status:  Retired Forensic scientist:  Information systems manager, Other (Comment Required) Nurse, mental health) PT Recommendations:  [place] / Referral to community resources:  [place]  Patient/Family's Response to care: Pt will d/c to SNF or home health PT.  Patient/Family's Understanding of and Emotional Response to Diagnosis, Current Treatment, and Prognosis:  Pt and pt's family are appreciative of the care provided by CSW at this time.  Emotional Assessment Appearance:  Appears stated age Attitude/Demeanor/Rapport:  Other (Cooperative) Affect (typically observed):  Pleasant, Accepting Orientation:  Oriented to Self, Oriented to Place, Oriented to  Time, Oriented to Situation Alcohol / Substance use:  Not Applicable Psych involvement (Current and /or in the community):  No (Comment)  Discharge Needs  Concerns to be addressed:  Care Coordination, Discharge Planning Concerns Readmission within the last 30 days:  No Current discharge risk:  Dependent with Mobility Barriers to Discharge:  Ship broker, Continued Medical Work up   [employer], [place] 07/25/2016, 5:58 PM

## 2016-07-25 NOTE — ED Notes (Signed)
PT at bedside.

## 2016-07-25 NOTE — Progress Notes (Signed)
Chaplain rounding the Ed was asked by the Pt's daughter who visit with them. Pt daughter told chaplain that the mother had broken her hip and had other medical complication and needed medical attention. During the conversation with the daughters, Tracy Richards noticed that the one of the daughter looked exhausted. The daughter's wanted to make sure their mother is admitted and gets treatment for medical problems issues and requested Chaplain for prayers and support which was provided.    07/25/16 1600  Clinical Encounter Type  Visited With Patient and family together  Visit Type Initial;Spiritual support  Consult/Referral To Other (Comment)  Spiritual Encounters  Spiritual Needs Prayer;Emotional  Stress Factors  Patient Stress Factors Exhausted  Family Stress Factors Exhausted

## 2016-07-25 NOTE — ED Notes (Signed)
Spoke with Jeb, Pharmacy Tech and asked if medication reconciliation could be done regarding when patient took medications last. Family states that pt took morning medications but "not fluid pill or evening meds". Pt given dinner tray and coffee.

## 2016-07-25 NOTE — ED Triage Notes (Signed)
Pt presents to ED with reports of pelvis, hip and low back pain. Pt seen yesterday and diagnosed with fractured pelvis. Pt states she can not handle the pain. Pt states tried to get a prescription filled yesterday for pain medication and was told they would not fill it due to she is allergic to what was prescribed.

## 2016-07-25 NOTE — ED Notes (Signed)
Pt unable to walk today. [facility] would not fill patient prescription yesterday. Pt has only had tylenol for pain. Pt c/o 10/10 pain. Family at bedside.

## 2016-07-25 NOTE — ED Provider Notes (Signed)
[facility] Emergency Department Provider Note  ____________________________________________  Time seen: Approximately 3:47 PM  I have reviewed the triage vital signs and the nursing notes.   HISTORY  Chief Complaint Fractured Pelvis   HPI Tracy Richards is a 82 y.o. female with history is documented below who presents for evaluation of suprapubic pain. Patient seen here for similar complaint and found to have acute right sacral fracture on MRI. According to the family patient lives alone and has been unable to ambulate since Saturday. The pain started Friday evening when she was doing dishes and has gotten progressively worse. Yesterday ED physician consulted the hospitalist for admission. Patient was evaluated by the hospitalist service and discharged home on Percocet. Family try to fill the prescription at [facility] however the prescription was flagged due to patient's possible allergy and was not filled. Patient has been taking Tylenol at home. Family brings her back because they have been unable to take care of her. She cannot walk even with her walker and she lives alone. She is complaining of severe sharp pain located in her lower abdomen, pain is worse with minimal movement. No new falls.   Past Medical History:  Diagnosis Date  . Asthma   . Benign paroxysmal vertigo   . COPD (chronic obstructive pulmonary disease) ([place])   . Depression   . Diabetes ([place])   . GERD (gastroesophageal reflux disease)   . Gout   . Hyperlipidemia   . Hypertension   . Hypothyroid   . Osteoporosis   . Peripheral neuropathy ([place])   . PVD (peripheral vascular disease) ([place])    blochage in Right leg -- had stent placement  . Renal artery stenosis ([place])   . Spinal stenosis   . Spinal stenosis   . Unspecified hereditary and idiopathic peripheral neuropathy     Patient Active Problem List   Diagnosis Date Noted  . Sacral fracture, closed ([place]) 07/24/2016  . Chronic respiratory  failure with hypoxia ([place]) 03/13/2015  . COPD bronchitis 10/20/2014  . Other specified hypothyroidism 04/01/2013  . HBP (high blood pressure) 03/19/2013    Past Surgical History:  Procedure Laterality Date  . ABDOMINAL HYSTERECTOMY    . APPENDECTOMY    . BACK SURGERY    . CATARACT EXTRACTION W/PHACO    . ILIAC ARTERY STENT    . KIDNEY SURGERY     ballooned multiple times  . TONSILLECTOMY    . VEIN SURGERY     VEIN STRIPPED    Prior to Admission medications   Medication Sig Start Date End Date Taking? Authorizing Provider  acetaminophen (TYLENOL) 500 MG tablet Take 500 mg by mouth every 6 (six) hours as needed for moderate pain or fever.   Yes Historical Provider, MD  aspirin EC 81 MG tablet Take 81 mg by mouth daily.   Yes Historical Provider, MD  atorvastatin (LIPITOR) 40 MG tablet Take 40 mg by mouth at bedtime.    Yes Historical Provider, MD  diltiazem (DILACOR XR) 180 MG 24 hr capsule Take 180 mg by mouth daily.   Yes Historical Provider, MD  Fluticasone-Salmeterol (ADVAIR DISKUS) 250-50 MCG/DOSE AEPB Inhale 1 puff into the lungs 2 (two) times daily. 03/16/15  Yes Tanda Rockers, MD  gabapentin (NEURONTIN) 600 MG tablet Take 600 mg by mouth 3 (three) times daily.   Yes Historical Provider, MD  levothyroxine (SYNTHROID, LEVOTHROID) 100 MCG tablet Take 100 mcg by mouth daily. 10/19/14  Yes Historical Provider, MD  losartan (COZAAR)  100 MG tablet Take 100 mg by mouth daily.   Yes Historical Provider, MD  meclizine (ANTIVERT) 32 MG tablet Take 32 mg by mouth 3 (three) times daily as needed for dizziness.   Yes Historical Provider, MD  metFORMIN (GLUCOPHAGE-XR) 500 MG 24 hr tablet Take 1,000 mg by mouth daily.   Yes Historical Provider, MD  metoprolol tartrate (LOPRESSOR) 25 MG tablet Take 25 mg by mouth 2 (two) times daily. 10/04/14  Yes Historical Provider, MD  oxyCODONE-acetaminophen (ROXICET) 5-325 MG tablet Take 1-2 tablets by mouth every 6 (six) hours as needed. 07/24/16  Yes Orbie Pyo, MD  pantoprazole (PROTONIX) 40 MG tablet Take 40 mg by mouth daily.   Yes Historical Provider, MD  potassium chloride (K-DUR) 10 MEQ tablet Take 10 mEq by mouth daily.   Yes Historical Provider, MD  predniSONE (DELTASONE) 10 MG tablet Take 10-50 mg by mouth daily. 50 mg daily for 2 days, then 40 mg daily for 2 days, then 30 mg daily for 2 days, then 20 mg daily for 2 days, then 10 mg daily for 2 days, then stop 07/20/16 07/30/16 Yes Historical Provider, MD    Allergies Amlodipine; Avelox [moxifloxacin hcl in nacl]; Carafate [sucralfate]; Celecoxib; Diazepam; Doxazosin; Klonopin [clonazepam]; Levofloxacin; Norvasc [amlodipine besylate]; Pantoprazole sodium; Ranitidine; Rofecoxib; Sulfa antibiotics; Sulfa drugs cross reactors; and Codeine  Family History  Problem Relation Age of Onset  . Sudden death Father     unknown causes  . Hypertension Mother   . Sudden death Mother     unknown causes  . Kidney disease Brother     Brights disease  . Bone cancer Brother   . Ovarian cancer Daughter     Social History Social History  Substance Use Topics  . Smoking status: Former Smoker    Packs/day: 0.25    Years: 20.00    Types: Cigarettes    Quit date: 10/03/1978  . Smokeless tobacco: Never Used  . Alcohol use No    Review of Systems  Constitutional: Negative for fever. Eyes: Negative for visual changes. ENT: Negative for sore throat. Cardiovascular: Negative for chest pain. Respiratory: Negative for shortness of breath. Gastrointestinal: +lower suprapubic pain. No abdominal pain, vomiting or diarrhea. Genitourinary: Negative for dysuria. Musculoskeletal: Negative for back pain.  Skin: Negative for rash. Neurological: Negative for headaches, weakness or numbness.  ____________________________________________   PHYSICAL EXAM:  VITAL SIGNS: ED Triage Vitals  Enc Vitals Group     BP 07/25/16 1259 (!) 160/96     Pulse Rate 07/25/16 1259 93     Resp 07/25/16  1259 18     Temp 07/25/16 1259 98.1 F (36.7 C)     Temp Source 07/25/16 1259 Oral     SpO2 07/25/16 1259 93 %     Weight 07/25/16 1259 125 lb (56.7 kg)     Height 07/25/16 1259 5' (1.524 m)     Head Circumference --      Peak Flow --      Pain Score 07/25/16 1300 10     Pain Loc --      Pain Edu? --      Excl. in [place]? --     Constitutional: Alert and oriented, patient with extreme difficulty moving from wheelchair to bed upon arrival in obvious distress due to pain. HEENT:      Head: Normocephalic and atraumatic.         Eyes: Conjunctivae are normal. Sclera is non-icteric. EOMI. PERRL  Mouth/Throat: Mucous membranes are moist.       Neck: Supple with no signs of meningismus. Cardiovascular: Regular rate and rhythm. No murmurs, gallops, or rubs. 2+ symmetrical distal pulses are present in all extremities. No JVD. Respiratory: Normal respiratory effort. Lungs are clear to auscultation bilaterally. No wheezes, crackles, or rhonchi.  Gastrointestinal: Soft, non tender, and non distended with positive bowel sounds. No rebound or guarding. Musculoskeletal: Patient has ttp over the pubis and R pelvis. Able to elevate both legs from the bed with pain.  Neurologic: Normal speech and language. Face is symmetric. Moving all extremities. No gross focal neurologic deficits are appreciated. DTR 2+ patella b/l Skin: Skin is warm, dry and intact. No rash noted. Psychiatric: Mood and affect are normal. Speech and behavior are normal.  ____________________________________________   LABS (all labs ordered are listed, but only abnormal results are displayed)  Labs Reviewed - No data to display ____________________________________________  EKG  none ____________________________________________  RADIOLOGY  none  ____________________________________________   PROCEDURES  Procedure(s) performed: None Procedures Critical Care performed:   None ____________________________________________   INITIAL IMPRESSION / ASSESSMENT AND PLAN / ED COURSE  82 y.o. female with history is documented below who presents for evaluation of worsening suprapubic pain from acute R pelvis fracture seen on MRI yesterday. DC from ED by Hospitalist service on percocet and unable to fill prescription due to possible allergy. Patient unaware of any allergic reaction to Percocet in the past. Patient does have documented anxiety as an allergic reaction to codeine. Patient is in significant distress due to pain at this time. We'll give her a dose of IV morphine. No indication for labs or imaging as they were done yesterday. Plan to consult PT to determine placement either admit to Hospitalist for inpatient pain control/PT or placement at rehab facility from ED.   Clinical Course  Comment By Time  Patient responded well to IV morphine 2mg  and now only has pain when she stands. She was evaluated by PT at the bedside who recommended short term inpatient rehab. Since patient has no significant pain at rest, I will switch patient to PO meds. Family adamant that they do not feel comfortable taking patient home. I spoke with Jeani Hawking, SW who will send requests to rehab facilities. Will start patient on standing tylenol and PRN oxycodone for pain. Rudene Re, MD 10/23 1656  Family unable to provide me with patient's home meds. I requested pharmacy tech to verify patient's meds so I order them while she is in the ED. Rudene Re, MD 10/23 1819   Meds confirmed by pharmacy and ordered. Losartan cancelled as patient's BP has been in the low 100s.  Pertinent labs & imaging results that were available during my care of the patient were reviewed by me and considered in my medical decision making (see chart for details).    ____________________________________________   FINAL CLINICAL IMPRESSION(S) / ED DIAGNOSES  Final diagnoses:  Closed fracture of sacrum,  unspecified portion of sacrum, initial encounter ([place])      NEW MEDICATIONS STARTED DURING THIS VISIT:  New Prescriptions   No medications on file     Note:  This document was prepared using Dragon voice recognition software and may include unintentional dictation errors.    Rudene Re, MD 07/25/16 2256

## 2016-07-25 NOTE — ED Notes (Signed)
Family at bedside. Pt family and patient given coffee.

## 2016-07-25 NOTE — ED Notes (Signed)
Social worker at bedside.

## 2016-07-25 NOTE — NC FL2 (Signed)
[place] LEVEL OF CARE SCREENING TOOL     IDENTIFICATION  Patient Name: Tracy Richards Birthdate: April 17, 1929 Sex: female Admission Date (Current Location): 07/25/2016  [place] and [place] Number:  Engineering geologist and Address:  [facility], [address] [place], [place] [address]      Provider Number: [id number]  Attending Physician Name and Address:  Rudene Re, MD  Relative Name and Phone Number:       Current Level of Care: Hospital Recommended Level of Care: [place] Prior Approval Number: [id number]  Date Approved/Denied: 07/25/16 PASRR Number:    Discharge Plan: SNF    Current Diagnoses: Patient Active Problem List   Diagnosis Date Noted  . Sacral fracture, closed ([place]) 07/24/2016  . Chronic respiratory failure with hypoxia ([place]) 03/13/2015  . COPD bronchitis 10/20/2014  . Other specified hypothyroidism 04/01/2013  . HBP (high blood pressure) 03/19/2013    Orientation RESPIRATION BLADDER Height & Weight     Self, Time, Situation, Place  Normal Incontinent Weight: 125 lb (56.7 kg) Height:  5' (152.4 cm)  BEHAVIORAL SYMPTOMS/MOOD NEUROLOGICAL BOWEL NUTRITION STATUS      Continent Diet (Heart Healthy)  AMBULATORY STATUS COMMUNICATION OF NEEDS Skin   Extensive Assist Verbally                         Personal Care Assistance Level of Assistance  Bathing, Feeding, Dressing Bathing Assistance: Limited assistance Feeding assistance: Independent Dressing Assistance: Limited assistance     Functional Limitations Info  Sight, Hearing, Speech Sight Info: Adequate Hearing Info: Adequate Speech Info: Adequate    SPECIAL CARE FACTORS FREQUENCY  PT (By licensed PT), OT (By licensed OT)     PT Frequency: 5x OT Frequency: 5x            Contractures Contractures Info: Not present    Additional Factors Info  Code Status, Allergies Code Status Info: Not on file Allergies Info:  Amlodipine, Avelox Moxifloxacin Hcl In Nacl, Carafate Sucralfate, Celecoxib, Diazepam, Doxazosin, Klonopin Clonazepam, Levofloxacin, Norvasc Amlodipine Besylate, Pantoprazole Sodium, Ranitidine, Rofecoxib, Sulfa Antibiotics, Sulfa Drugs Cross Reactors, Codeine           Current Medications (07/25/2016):  This is the current hospital active medication list Current Facility-Administered Medications  Medication Dose Route Frequency Provider Last Rate Last Dose  . acetaminophen (TYLENOL) tablet 1,000 mg  1,000 mg Oral Once [place], MD      . acetaminophen (TYLENOL) tablet 1,000 mg  1,000 mg Oral Q8H Rudene Re, MD      . bacitracin ointment   Topical BID Rudene Re, MD   1 application at [id number]  . oxyCODONE (Oxy IR/ROXICODONE) immediate release tablet 5 mg  5 mg Oral Q3H PRN Rudene Re, MD      . senna [facility]) tablet 8.6 mg  1 tablet Oral Daily Rudene Re, MD       Current Outpatient Prescriptions  Medication Sig Dispense Refill  . acetaminophen (TYLENOL) 500 MG tablet Take 500 mg by mouth every 6 (six) hours as needed for moderate pain or fever.    . ALPRAZolam (XANAX) 0.5 MG tablet Take 0.5 mg by mouth 2 (two) times daily as needed for anxiety.    Marland Kitchen aspirin EC 81 MG tablet Take 81 mg by mouth daily.    Marland Kitchen atorvastatin (LIPITOR) 40 MG tablet Take 40 mg by mouth at bedtime.     Marland Kitchen diltiazem (DILACOR XR) 180 MG  24 hr capsule Take 180 mg by mouth daily.    . Fluticasone-Salmeterol (ADVAIR DISKUS) 250-50 MCG/DOSE AEPB Inhale 1 puff into the lungs 2 (two) times daily. 60 each 0  . gabapentin (NEURONTIN) 600 MG tablet Take 600 mg by mouth 3 (three) times daily.    Marland Kitchen levothyroxine (SYNTHROID, LEVOTHROID) 100 MCG tablet Take 100 mcg by mouth daily.  4  . losartan (COZAAR) 100 MG tablet Take 100 mg by mouth daily.    . meclizine (ANTIVERT) 32 MG tablet Take 32 mg by mouth 3 (three) times daily as needed for dizziness.    . metoprolol tartrate (LOPRESSOR) 25  MG tablet Take 25 mg by mouth 2 (two) times daily.  4  . oxyCODONE-acetaminophen (ROXICET) 5-325 MG tablet Take 1-2 tablets by mouth every 6 (six) hours as needed. 15 tablet 0  . pantoprazole (PROTONIX) 40 MG tablet Take 40 mg by mouth daily.    . potassium chloride (K-DUR) 10 MEQ tablet Take 10 mEq by mouth daily.    . predniSONE (DELTASONE) 10 MG tablet Take 10-50 mg by mouth daily. 50 mg daily for 2 days, then 40 mg daily for 2 days, then 30 mg daily for 2 days, then 20 mg daily for 2 days, then 10 mg daily for 2 days, then stop    . Tiotropium Bromide Monohydrate (SPIRIVA RESPIMAT) 2.5 MCG/ACT AERS Inhale 2 puffs into the lungs daily as needed. For wheezing/shortness of breath    . torsemide (DEMADEX) 20 MG tablet Take 20 mg by mouth daily.        Discharge Medications: Please see discharge summary for a list of discharge medications.  Relevant Imaging Results:  Relevant Lab Results:   Additional Information SSN: [id number]  Georga Kaufmann, LCSWA

## 2016-07-25 NOTE — ED Notes (Signed)
Md at bedside

## 2016-07-25 NOTE — ED Notes (Addendum)
Md at bedside

## 2016-07-25 NOTE — ED Notes (Addendum)
Spoke with family after 1900 report,

## 2016-07-25 NOTE — ED Notes (Signed)
Pt given denture cup and cup to place hearing aids in. Family continues to sit at bedside.

## 2016-07-25 NOTE — Evaluation (Signed)
Physical Therapy Evaluation Patient Details Name: Tracy Richards MRN: [id number] DOB: 14-Dec-1928 Today's Date: 07/25/2016   History of Present Illness  Pt is an 82 y.o female who presented to the ED yesterday (07/24/16) with LE weakness and lower abdominal pain.  Pt's last fall Sept 23rd per pt's family.  Pt found to have acute R sacral fx with edema/hemmorrhage and was discharged home yesterday (per ED note, MD Sabra Heck from Ortho recommending bed to chair and plan to follow-up with MD Hooten).  Pt unable to walk today (and significant difficulty with transfers) and family brought pt back to the ED.  PMH includes hernia L lower quadrant, R sided sciatica, BPPV, COPD, and peripheral neuropathy.  Clinical Impression  Prior to recent ED visits, pt was modified independent with functional mobility (use of SPC or RW).  Pt lives alone in 1 level home with stairs to enter without railings.  Currently pt is mod assist supine to/from sit, and min to mod assist x2 with sit to/from stand with RW.  Pt min to mod assist x2 to attempt ambulation with RW (pt able to shuffle L LE a little to take a step laterally but unable to lift R LE to take a step; both limited d/t pain).  Pt would benefit from skilled PT to address noted impairments and functional limitations.  Recommend pt discharge to STR when medically appropriate.    Follow Up Recommendations SNF    Equipment Recommendations   (TBD)    Recommendations for Other Services       Precautions / Restrictions Precautions Precautions: Fall Restrictions Other Position/Activity Restrictions: No WB'ing restrictions noted in chart; acute R sacral fx      Mobility  Bed Mobility Overal bed mobility: Needs Assistance Bed Mobility: Supine to Sit;Sit to Supine     Supine to sit: Mod assist Sit to supine: Mod assist   General bed mobility comments: assist for trunk and L LE supine to/from sit; vc's for technique required  Transfers Overall transfer level:  Needs assistance Equipment used: Rolling walker (2 wheeled) Transfers: Sit to/from Stand Sit to Stand: Min assist;Mod assist;+2 physical assistance;+2 safety/equipment         General transfer comment: vc's required for hand and feet placement; increased effort and time to stand secondary to pain; step stool used (and stabilized for safety with 2 person assist) d/t pt's height and ED bed too tall to stand from safely  Ambulation/Gait Ambulation/Gait assistance: Min assist;Mod assist;+2 physical assistance;+2 safety/equipment Ambulation Distance (Feet):  (pt attempted a step with each foot) Assistive device: Rolling walker (2 wheeled)   Gait velocity: decreased   General Gait Details: pt able to shuffle L foot towards the R foot and barely able to clear the floor; pt unable to move R foot secondary to pain; vc's required to increased UE support through [employer] Mobility    Modified Rankin (Stroke Patients Only)       Balance Overall balance assessment: Needs assistance;History of Falls Sitting-balance support: Bilateral upper extremity supported;Feet unsupported Sitting balance-Leahy Scale: Good     Standing balance support: Bilateral upper extremity supported (on RW) Standing balance-Leahy Scale: Fair Standing balance comment: static standing                             Pertinent Vitals/Pain Pain Assessment: 0-10 Pain Score: 10-Worst pain ever (10/10 with functional activities;  6/10 resting in bed end of session (MD notified)) Pain Location: R part of her "bottom" Pain Descriptors / Indicators: Sore;Guarding;Sharp;Tender Pain Intervention(s): Limited activity within patient's tolerance;Monitored during session;Premedicated before session;Repositioned  See flow sheet for vitals.    Home Living Family/patient expects to be discharged to:: Private residence Living Arrangements: Alone Available Help at Discharge: Family Type of  Home: House Home Access: Stairs to enter Entrance Stairs-Rails: None Entrance Stairs-Number of Steps: 4-5 Home Layout: One level Home Equipment: Environmental consultant - 2 wheels;Cane - single point      Prior Function Level of Independence: Independent         Comments: Pt was independent (was independent after last fall on Sept 23rd) but has required increasing assist since late last week and having more difficulty with walking every day; pt on 2 L/min home O2     Hand Dominance        Extremity/Trunk Assessment   Upper Extremity Assessment: Generalized weakness           Lower Extremity Assessment: Generalized weakness         Communication   Communication: HOH  Cognition Arousal/Alertness: Awake/alert Behavior During Therapy: WFL for tasks assessed/performed Overall Cognitive Status: Within Functional Limits for tasks assessed                      General Comments General comments (skin integrity, edema, etc.): Pt's 2 daughter's present for PT session.  Nursing called PT staff for physical therapy eval in ED.  Pt agreeable to PT session.    Exercises     Assessment/Plan    PT Assessment Patient needs continued PT services  PT Problem List Decreased strength;Decreased activity tolerance;Decreased balance;Decreased mobility;Decreased knowledge of use of DME;Decreased knowledge of precautions;Pain          PT Treatment Interventions DME instruction;Gait training;Stair training;Functional mobility training;Therapeutic activities;Therapeutic exercise;Balance training;Patient/family education    PT Goals (Current goals can be found in the Care Plan section)  Acute Rehab PT Goals Patient Stated Goal: to have less pain with mobility PT Goal Formulation: With patient Time For Goal Achievement: 08/08/16 Potential to Achieve Goals: Fair    Frequency 7X/week   Barriers to discharge Decreased caregiver support      Co-evaluation               End of  Session Equipment Utilized During Treatment: Gait belt;Oxygen (2 L/min O2 via nasal cannula) Activity Tolerance: Patient limited by pain Patient left: in bed;with call bell/phone within reach;with family/visitor present (B railings on ED bed raised) Nurse Communication: Mobility status;Precautions (MD notified of pt's pain status)    Functional Assessment Tool Used: AM-PAC without stairs Functional Limitation: Mobility: Walking and moving around Mobility: Walking and Moving Around Current Status [id number]): At least 60 percent but less than 80 percent impaired, limited or restricted Mobility: Walking and Moving Around Goal Status [phone]): At least 1 percent but less than 20 percent impaired, limited or restricted    Time: 1615-1640 PT Time Calculation (min) (ACUTE ONLY): 25 min   Charges:   PT Evaluation $PT Eval Low Complexity: 1 Procedure     PT G Codes:   PT G-Codes **NOT FOR INPATIENT CLASS** Functional Assessment Tool Used: AM-PAC without stairs Functional Limitation: Mobility: Walking and moving around Mobility: Walking and Moving Around Current Status [id number]): At least 60 percent but less than 80 percent impaired, limited or restricted Mobility: Walking and Moving Around Goal Status [phone]): At least 1 percent but less  than 20 percent impaired, limited or restricted    Leitha Bleak 07/25/2016, 5:40 PM Leitha Bleak, [place]

## 2016-07-26 DIAGNOSIS — S3210XA Unspecified fracture of sacrum, initial encounter for closed fracture: Secondary | ICD-10-CM | POA: Diagnosis not present

## 2016-07-26 DIAGNOSIS — Z7401 Bed confinement status: Secondary | ICD-10-CM | POA: Diagnosis not present

## 2016-07-26 DIAGNOSIS — R102 Pelvic and perineal pain: Secondary | ICD-10-CM | POA: Diagnosis not present

## 2016-07-26 MED ORDER — ACETAMINOPHEN 500 MG PO TABS: 1000.0000 mg | ORAL_TABLET | Freq: Three times a day (TID) | ORAL | 2 refills | Status: AC

## 2016-07-26 MED ORDER — OXYCODONE HCL 5 MG PO TABS: 5.0000 mg | ORAL_TABLET | Freq: Four times a day (QID) | ORAL | 0 refills | Status: DC | PRN

## 2016-07-26 MED ORDER — SENNOSIDES 8.6 MG PO TABS: 1.0000 | ORAL_TABLET | Freq: Every day | ORAL | 1 refills | Status: DC

## 2016-07-26 NOTE — ED Notes (Signed)
Social work at bedside for update on placement and plan and care, pt resp even and unlabored, pt given lunch tray

## 2016-07-26 NOTE — Progress Notes (Addendum)
10:21 AM Daughter called and given rates. She requests a rate from [facility] and information sent. LCSW spoke with Heather:  Room and board:  288.00/night with payment upfront: 2304.00.   LCSW assisting ED SW with case this morning. LCSW followed up with daughter: Horris Latino via phone.  SNF: [facility] has made a bed offer and daughter is interested.  Rates for room:  Semi private:  385/night (only room and board) Rehab and supplies all extra  Private room:  415/night (same as above with rehab being extra)  There is a required payment of a week for patient to admit.   Thus:  For semi private:  2695.00 Private:  2905.00.  LCSW will notify patient's daughter in the next few minutes. She requests time to get ito work.  Will follow up with team once decision has been made. Bed is available if family wants to accept.  Lane Hacker, MSW Clinical Social Work: [employer] Coverage for :  [phone]

## 2016-07-26 NOTE — ED Provider Notes (Signed)
-----------------------------------------   6:37 AM on 07/26/2016 -----------------------------------------  No events overnight. Patient sleeping in no acute distress. Disposition pending clinical social work team.   Paulette Blanch, MD 07/26/16 [phone]

## 2016-07-26 NOTE — ED Notes (Signed)
Pt resting in bed, family at bedside, pt given juice and breakfast tray ordered, pt updated on plan of care awaiting placement

## 2016-07-26 NOTE — Clinical Social Work Placement (Signed)
   CLINICAL SOCIAL WORK PLACEMENT  NOTE  Date:  07/26/2016  Patient Details  Name: Tracy Richards MRN: [id number] Date of Birth: 03-23-1929  Clinical Social Work is seeking post-discharge placement for this patient at the [place] level of care (*CSW will initial, date and re-position this form in  chart as items are completed):  Yes   Patient/family provided with [facility] Work Department's list of facilities offering this level of care within the geographic area requested by the patient (or if unable, by the patient's family).  Yes   Patient/family informed of their freedom to choose among providers that offer the needed level of care, that participate in Medicare, Medicaid or managed care program needed by the patient, have an available bed and are willing to accept the patient.  Yes   Patient/family informed of [facility]'s ownership interest in [facility] and [facility], as well as of the fact that they are under no obligation to receive care at these facilities.  PASRR submitted to EDS on       PASRR number received on       Existing PASRR number confirmed on 07/25/16     FL2 transmitted to all facilities in geographic area requested by pt/family on 07/25/16     FL2 transmitted to all facilities within larger geographic area on       Patient informed that his/her managed care company has contracts with or will negotiate with certain facilities, including the following:   ([facility], [employer], [facility], [facility] )     Yes   Patient/family informed of bed offers received.  Patient chooses bed at  ([facility])     Physician recommends and patient chooses bed at      Patient to be transferred to  ([place]) on 07/26/16.  Patient to be transferred to facility by [facility]     Patient family notified on 07/26/16 of transfer.  Name of family member notified:  Horris Latino (daughter and HPOA) [phone]      PHYSICIAN Please prepare priority discharge summary, including medications     Additional Comment:    _______________________________________________ Georga Kaufmann, LCSWA 07/26/2016, 3:44 PM

## 2016-07-26 NOTE — ED Notes (Signed)
Pt resting in bed, eyes closed, resp even and unlabored

## 2016-07-26 NOTE — ED Provider Notes (Signed)
-----------------------------------------   2:26 PM on 07/26/2016 -----------------------------------------  Patient has been seen and evaluated by physical therapy and they recommend acute rehabilitation. Patient remains calm and cooperative in the emergency department. We're currently working with Education officer, museum for appropriate outpatient placement to rehabilitation facility. Patient will be discharged appropriate facility once this has been finalized.   Harvest Dark, MD 07/26/16 [phone]

## 2016-07-26 NOTE — Progress Notes (Signed)
While visiting with the trauma patient in ED, chaplain make a follow-up visit with a patient in ED02 who he had seen before and wanted to see how she was doing. Patient appeared to be distressed with the fact that she was not hospitalized. And she couldn't imagine being moved to other facilities prior to being properly examined here. She was also concerned about the financial burden that treatment in other facilities would have on her pocket if the insurance doesn't pick the tab. Chaplain listen to the patient's concerns and provided the patient with consoling words and prayers.    07/26/16 1400  Clinical Encounter Type  Visited With Patient  Visit Type Follow-up;Spiritual support  Consult/Referral To Other (Comment)  Spiritual Encounters  Spiritual Needs Prayer;Emotional;Other (Comment)  Stress Factors  Patient Stress Factors Other (Comment)  Family Stress Factors Other (Comment)  Advance Directives (For Healthcare)  Does patient have an advance directive? No

## 2016-07-26 NOTE — ED Notes (Signed)
Pt resting in bed, family at bedside.

## 2016-07-26 NOTE — ED Notes (Signed)
Pt moved to 1H, pt updated on plan of care awaiting social work, pt irritated she is in hallway, daughter states "why cant we just be admitted?", case manager spoke to pt and family, charge RN aware

## 2016-07-26 NOTE — ED Notes (Signed)
Report given to Marcie Bal at [facility], family aware of pending discharge transfer

## 2016-07-26 NOTE — Progress Notes (Signed)
Pt and pt's family have chosen bed at [facility]. Pt will be transported via [facility]. Pt will be in room 701B. RN can call report to [phone]. ED secretary and EDP have been notified.   Pt and pt's family are aware of above and agreeable to the plan. CSW will continue to follow pt and assist as needed.  Georga Kaufmann, MSW, [place]

## 2016-07-26 NOTE — Care Management Note (Signed)
Case Management Note  Patient Details  Name: Tracy Richards MRN: [id number] Date of Birth: 11-27-1928  Subjective/Objective:  Spoke at length to daughter Tracy Richards at bedside with patient participating. Re-iterated that Medicare only pays for placement when the 3 IP day is met. The patient does not meet this requirement.  After much discussion the daughter agrees to asking CSW to looking for a bed where they pay out of pocket.  The lady has a Actuary that is not reliable at home/ family friend.The daughter fears that the patient would be beside herself.    Daughter enroute to work, and has left her number [phone] for contact. E-mail to CSW to let her know details of decision.               Action/Plan:   Expected Discharge Date:                  Expected Discharge Plan:     In-House Referral:     Discharge planning Services     Post Acute Care Choice:    Choice offered to:     DME Arranged:    DME Agency:     HH Arranged:    HH Agency:     Status of Service:     If discussed at [employer] of Stay Meetings, dates discussed:    Additional Comments:  Beau Fanny, RN 07/26/2016, 9:07 AM

## 2016-07-26 NOTE — ED Notes (Signed)
Pt given breakfast tray, resting in bed eating 

## 2016-07-27 DIAGNOSIS — Z4789 Encounter for other orthopedic aftercare: Secondary | ICD-10-CM | POA: Diagnosis not present

## 2016-07-27 DIAGNOSIS — J9611 Chronic respiratory failure with hypoxia: Secondary | ICD-10-CM | POA: Diagnosis not present

## 2016-07-27 DIAGNOSIS — M6281 Muscle weakness (generalized): Secondary | ICD-10-CM | POA: Diagnosis not present

## 2016-07-27 DIAGNOSIS — W19XXXD Unspecified fall, subsequent encounter: Secondary | ICD-10-CM | POA: Diagnosis not present

## 2016-07-27 DIAGNOSIS — R2681 Unsteadiness on feet: Secondary | ICD-10-CM | POA: Diagnosis not present

## 2016-07-27 DIAGNOSIS — R41841 Cognitive communication deficit: Secondary | ICD-10-CM | POA: Diagnosis not present

## 2016-07-27 DIAGNOSIS — R279 Unspecified lack of coordination: Secondary | ICD-10-CM | POA: Diagnosis not present

## 2016-07-28 DIAGNOSIS — M6281 Muscle weakness (generalized): Secondary | ICD-10-CM | POA: Diagnosis not present

## 2016-07-28 DIAGNOSIS — R279 Unspecified lack of coordination: Secondary | ICD-10-CM | POA: Diagnosis not present

## 2016-07-28 DIAGNOSIS — W19XXXD Unspecified fall, subsequent encounter: Secondary | ICD-10-CM | POA: Diagnosis not present

## 2016-07-28 DIAGNOSIS — R2681 Unsteadiness on feet: Secondary | ICD-10-CM | POA: Diagnosis not present

## 2016-07-28 DIAGNOSIS — R41841 Cognitive communication deficit: Secondary | ICD-10-CM | POA: Diagnosis not present

## 2016-07-28 DIAGNOSIS — Z4789 Encounter for other orthopedic aftercare: Secondary | ICD-10-CM | POA: Diagnosis not present

## 2016-07-29 DIAGNOSIS — W19XXXD Unspecified fall, subsequent encounter: Secondary | ICD-10-CM | POA: Diagnosis not present

## 2016-07-29 DIAGNOSIS — Z4789 Encounter for other orthopedic aftercare: Secondary | ICD-10-CM | POA: Diagnosis not present

## 2016-07-29 DIAGNOSIS — R41841 Cognitive communication deficit: Secondary | ICD-10-CM | POA: Diagnosis not present

## 2016-07-29 DIAGNOSIS — R2681 Unsteadiness on feet: Secondary | ICD-10-CM | POA: Diagnosis not present

## 2016-07-29 DIAGNOSIS — M6281 Muscle weakness (generalized): Secondary | ICD-10-CM | POA: Diagnosis not present

## 2016-07-29 DIAGNOSIS — R279 Unspecified lack of coordination: Secondary | ICD-10-CM | POA: Diagnosis not present

## 2016-08-01 DIAGNOSIS — M6281 Muscle weakness (generalized): Secondary | ICD-10-CM | POA: Diagnosis not present

## 2016-08-01 DIAGNOSIS — Z4789 Encounter for other orthopedic aftercare: Secondary | ICD-10-CM | POA: Diagnosis not present

## 2016-08-01 DIAGNOSIS — R2681 Unsteadiness on feet: Secondary | ICD-10-CM | POA: Diagnosis not present

## 2016-08-01 DIAGNOSIS — R279 Unspecified lack of coordination: Secondary | ICD-10-CM | POA: Diagnosis not present

## 2016-08-01 DIAGNOSIS — R41841 Cognitive communication deficit: Secondary | ICD-10-CM | POA: Diagnosis not present

## 2016-08-01 DIAGNOSIS — W19XXXD Unspecified fall, subsequent encounter: Secondary | ICD-10-CM | POA: Diagnosis not present

## 2016-08-02 DIAGNOSIS — R279 Unspecified lack of coordination: Secondary | ICD-10-CM | POA: Diagnosis not present

## 2016-08-02 DIAGNOSIS — Z4789 Encounter for other orthopedic aftercare: Secondary | ICD-10-CM | POA: Diagnosis not present

## 2016-08-02 DIAGNOSIS — R41841 Cognitive communication deficit: Secondary | ICD-10-CM | POA: Diagnosis not present

## 2016-08-02 DIAGNOSIS — M6281 Muscle weakness (generalized): Secondary | ICD-10-CM | POA: Diagnosis not present

## 2016-08-02 DIAGNOSIS — R2681 Unsteadiness on feet: Secondary | ICD-10-CM | POA: Diagnosis not present

## 2016-08-02 DIAGNOSIS — W19XXXD Unspecified fall, subsequent encounter: Secondary | ICD-10-CM | POA: Diagnosis not present

## 2016-08-03 DIAGNOSIS — R2681 Unsteadiness on feet: Secondary | ICD-10-CM | POA: Diagnosis not present

## 2016-08-03 DIAGNOSIS — M6281 Muscle weakness (generalized): Secondary | ICD-10-CM | POA: Diagnosis not present

## 2016-08-03 DIAGNOSIS — Z4789 Encounter for other orthopedic aftercare: Secondary | ICD-10-CM | POA: Diagnosis not present

## 2016-08-03 DIAGNOSIS — W19XXXD Unspecified fall, subsequent encounter: Secondary | ICD-10-CM | POA: Diagnosis not present

## 2016-08-03 DIAGNOSIS — R279 Unspecified lack of coordination: Secondary | ICD-10-CM | POA: Diagnosis not present

## 2016-08-04 DIAGNOSIS — M6281 Muscle weakness (generalized): Secondary | ICD-10-CM | POA: Diagnosis not present

## 2016-08-04 DIAGNOSIS — Z4789 Encounter for other orthopedic aftercare: Secondary | ICD-10-CM | POA: Diagnosis not present

## 2016-08-04 DIAGNOSIS — R2681 Unsteadiness on feet: Secondary | ICD-10-CM | POA: Diagnosis not present

## 2016-08-04 DIAGNOSIS — W19XXXD Unspecified fall, subsequent encounter: Secondary | ICD-10-CM | POA: Diagnosis not present

## 2016-08-04 DIAGNOSIS — R279 Unspecified lack of coordination: Secondary | ICD-10-CM | POA: Diagnosis not present

## 2016-08-07 DIAGNOSIS — N390 Urinary tract infection, site not specified: Secondary | ICD-10-CM | POA: Diagnosis not present

## 2016-08-07 DIAGNOSIS — R1111 Vomiting without nausea: Secondary | ICD-10-CM | POA: Diagnosis not present

## 2016-08-07 DIAGNOSIS — Z79899 Other long term (current) drug therapy: Secondary | ICD-10-CM | POA: Diagnosis not present

## 2016-08-08 DIAGNOSIS — Z4789 Encounter for other orthopedic aftercare: Secondary | ICD-10-CM | POA: Diagnosis not present

## 2016-08-08 DIAGNOSIS — R279 Unspecified lack of coordination: Secondary | ICD-10-CM | POA: Diagnosis not present

## 2016-08-08 DIAGNOSIS — W19XXXD Unspecified fall, subsequent encounter: Secondary | ICD-10-CM | POA: Diagnosis not present

## 2016-08-08 DIAGNOSIS — M6281 Muscle weakness (generalized): Secondary | ICD-10-CM | POA: Diagnosis not present

## 2016-08-08 DIAGNOSIS — R2681 Unsteadiness on feet: Secondary | ICD-10-CM | POA: Diagnosis not present

## 2016-08-09 DIAGNOSIS — R279 Unspecified lack of coordination: Secondary | ICD-10-CM | POA: Diagnosis not present

## 2016-08-09 DIAGNOSIS — M6281 Muscle weakness (generalized): Secondary | ICD-10-CM | POA: Diagnosis not present

## 2016-08-09 DIAGNOSIS — R2681 Unsteadiness on feet: Secondary | ICD-10-CM | POA: Diagnosis not present

## 2016-08-09 DIAGNOSIS — Z4789 Encounter for other orthopedic aftercare: Secondary | ICD-10-CM | POA: Diagnosis not present

## 2016-08-09 DIAGNOSIS — W19XXXD Unspecified fall, subsequent encounter: Secondary | ICD-10-CM | POA: Diagnosis not present

## 2016-08-10 ENCOUNTER — Other Ambulatory Visit (HOSPITAL_COMMUNITY): Payer: Self-pay | Admitting: Internal Medicine

## 2016-08-10 DIAGNOSIS — W19XXXD Unspecified fall, subsequent encounter: Secondary | ICD-10-CM | POA: Diagnosis not present

## 2016-08-10 DIAGNOSIS — R112 Nausea with vomiting, unspecified: Secondary | ICD-10-CM

## 2016-08-10 DIAGNOSIS — R279 Unspecified lack of coordination: Secondary | ICD-10-CM | POA: Diagnosis not present

## 2016-08-10 DIAGNOSIS — R2681 Unsteadiness on feet: Secondary | ICD-10-CM | POA: Diagnosis not present

## 2016-08-10 DIAGNOSIS — Z4789 Encounter for other orthopedic aftercare: Secondary | ICD-10-CM | POA: Diagnosis not present

## 2016-08-10 DIAGNOSIS — M6281 Muscle weakness (generalized): Secondary | ICD-10-CM | POA: Diagnosis not present

## 2016-08-11 DIAGNOSIS — R2681 Unsteadiness on feet: Secondary | ICD-10-CM | POA: Diagnosis not present

## 2016-08-11 DIAGNOSIS — R279 Unspecified lack of coordination: Secondary | ICD-10-CM | POA: Diagnosis not present

## 2016-08-11 DIAGNOSIS — Z4789 Encounter for other orthopedic aftercare: Secondary | ICD-10-CM | POA: Diagnosis not present

## 2016-08-11 DIAGNOSIS — M6281 Muscle weakness (generalized): Secondary | ICD-10-CM | POA: Diagnosis not present

## 2016-08-11 DIAGNOSIS — W19XXXD Unspecified fall, subsequent encounter: Secondary | ICD-10-CM | POA: Diagnosis not present

## 2016-08-12 DIAGNOSIS — M6281 Muscle weakness (generalized): Secondary | ICD-10-CM | POA: Diagnosis not present

## 2016-08-12 DIAGNOSIS — R2681 Unsteadiness on feet: Secondary | ICD-10-CM | POA: Diagnosis not present

## 2016-08-12 DIAGNOSIS — Z4789 Encounter for other orthopedic aftercare: Secondary | ICD-10-CM | POA: Diagnosis not present

## 2016-08-12 DIAGNOSIS — R0602 Shortness of breath: Secondary | ICD-10-CM | POA: Diagnosis not present

## 2016-08-12 DIAGNOSIS — R05 Cough: Secondary | ICD-10-CM | POA: Diagnosis not present

## 2016-08-12 DIAGNOSIS — R279 Unspecified lack of coordination: Secondary | ICD-10-CM | POA: Diagnosis not present

## 2016-08-12 DIAGNOSIS — W19XXXD Unspecified fall, subsequent encounter: Secondary | ICD-10-CM | POA: Diagnosis not present

## 2016-08-15 DIAGNOSIS — Z4789 Encounter for other orthopedic aftercare: Secondary | ICD-10-CM | POA: Diagnosis not present

## 2016-08-15 DIAGNOSIS — W19XXXD Unspecified fall, subsequent encounter: Secondary | ICD-10-CM | POA: Diagnosis not present

## 2016-08-15 DIAGNOSIS — M6281 Muscle weakness (generalized): Secondary | ICD-10-CM | POA: Diagnosis not present

## 2016-08-15 DIAGNOSIS — R2681 Unsteadiness on feet: Secondary | ICD-10-CM | POA: Diagnosis not present

## 2016-08-15 DIAGNOSIS — R279 Unspecified lack of coordination: Secondary | ICD-10-CM | POA: Diagnosis not present

## 2016-08-16 DIAGNOSIS — W19XXXD Unspecified fall, subsequent encounter: Secondary | ICD-10-CM | POA: Diagnosis not present

## 2016-08-16 DIAGNOSIS — R279 Unspecified lack of coordination: Secondary | ICD-10-CM | POA: Diagnosis not present

## 2016-08-16 DIAGNOSIS — R2681 Unsteadiness on feet: Secondary | ICD-10-CM | POA: Diagnosis not present

## 2016-08-16 DIAGNOSIS — Z4789 Encounter for other orthopedic aftercare: Secondary | ICD-10-CM | POA: Diagnosis not present

## 2016-08-16 DIAGNOSIS — M6281 Muscle weakness (generalized): Secondary | ICD-10-CM | POA: Diagnosis not present

## 2016-08-17 ENCOUNTER — Ambulatory Visit (HOSPITAL_COMMUNITY): Admission: RE | Admit: 2016-08-17 | Payer: Medicare Other | Source: Ambulatory Visit

## 2016-08-17 DIAGNOSIS — Z4789 Encounter for other orthopedic aftercare: Secondary | ICD-10-CM | POA: Diagnosis not present

## 2016-08-17 DIAGNOSIS — R2681 Unsteadiness on feet: Secondary | ICD-10-CM | POA: Diagnosis not present

## 2016-08-17 DIAGNOSIS — R279 Unspecified lack of coordination: Secondary | ICD-10-CM | POA: Diagnosis not present

## 2016-08-17 DIAGNOSIS — W19XXXD Unspecified fall, subsequent encounter: Secondary | ICD-10-CM | POA: Diagnosis not present

## 2016-08-17 DIAGNOSIS — M6281 Muscle weakness (generalized): Secondary | ICD-10-CM | POA: Diagnosis not present

## 2016-08-18 DIAGNOSIS — R2681 Unsteadiness on feet: Secondary | ICD-10-CM | POA: Diagnosis not present

## 2016-08-18 DIAGNOSIS — W19XXXD Unspecified fall, subsequent encounter: Secondary | ICD-10-CM | POA: Diagnosis not present

## 2016-08-18 DIAGNOSIS — R279 Unspecified lack of coordination: Secondary | ICD-10-CM | POA: Diagnosis not present

## 2016-08-18 DIAGNOSIS — Z4789 Encounter for other orthopedic aftercare: Secondary | ICD-10-CM | POA: Diagnosis not present

## 2016-08-18 DIAGNOSIS — M6281 Muscle weakness (generalized): Secondary | ICD-10-CM | POA: Diagnosis not present

## 2016-08-19 DIAGNOSIS — Z4789 Encounter for other orthopedic aftercare: Secondary | ICD-10-CM | POA: Diagnosis not present

## 2016-08-19 DIAGNOSIS — W19XXXD Unspecified fall, subsequent encounter: Secondary | ICD-10-CM | POA: Diagnosis not present

## 2016-08-19 DIAGNOSIS — R279 Unspecified lack of coordination: Secondary | ICD-10-CM | POA: Diagnosis not present

## 2016-08-19 DIAGNOSIS — M6281 Muscle weakness (generalized): Secondary | ICD-10-CM | POA: Diagnosis not present

## 2016-08-19 DIAGNOSIS — R2681 Unsteadiness on feet: Secondary | ICD-10-CM | POA: Diagnosis not present

## 2016-08-22 DIAGNOSIS — R2681 Unsteadiness on feet: Secondary | ICD-10-CM | POA: Diagnosis not present

## 2016-08-22 DIAGNOSIS — R279 Unspecified lack of coordination: Secondary | ICD-10-CM | POA: Diagnosis not present

## 2016-08-22 DIAGNOSIS — W19XXXD Unspecified fall, subsequent encounter: Secondary | ICD-10-CM | POA: Diagnosis not present

## 2016-08-22 DIAGNOSIS — Z4789 Encounter for other orthopedic aftercare: Secondary | ICD-10-CM | POA: Diagnosis not present

## 2016-08-22 DIAGNOSIS — M6281 Muscle weakness (generalized): Secondary | ICD-10-CM | POA: Diagnosis not present

## 2016-09-01 DIAGNOSIS — J9611 Chronic respiratory failure with hypoxia: Secondary | ICD-10-CM | POA: Diagnosis not present

## 2016-09-01 DIAGNOSIS — Z7982 Long term (current) use of aspirin: Secondary | ICD-10-CM | POA: Diagnosis not present

## 2016-09-01 DIAGNOSIS — Z95828 Presence of other vascular implants and grafts: Secondary | ICD-10-CM | POA: Diagnosis not present

## 2016-09-01 DIAGNOSIS — M810 Age-related osteoporosis without current pathological fracture: Secondary | ICD-10-CM | POA: Diagnosis not present

## 2016-09-01 DIAGNOSIS — Z9981 Dependence on supplemental oxygen: Secondary | ICD-10-CM | POA: Diagnosis not present

## 2016-09-01 DIAGNOSIS — Z7951 Long term (current) use of inhaled steroids: Secondary | ICD-10-CM | POA: Diagnosis not present

## 2016-09-01 DIAGNOSIS — I10 Essential (primary) hypertension: Secondary | ICD-10-CM | POA: Diagnosis not present

## 2016-09-01 DIAGNOSIS — H8110 Benign paroxysmal vertigo, unspecified ear: Secondary | ICD-10-CM | POA: Diagnosis not present

## 2016-09-01 DIAGNOSIS — F329 Major depressive disorder, single episode, unspecified: Secondary | ICD-10-CM | POA: Diagnosis not present

## 2016-09-01 DIAGNOSIS — Z87891 Personal history of nicotine dependence: Secondary | ICD-10-CM | POA: Diagnosis not present

## 2016-09-01 DIAGNOSIS — J449 Chronic obstructive pulmonary disease, unspecified: Secondary | ICD-10-CM | POA: Diagnosis not present

## 2016-09-01 DIAGNOSIS — E1142 Type 2 diabetes mellitus with diabetic polyneuropathy: Secondary | ICD-10-CM | POA: Diagnosis not present

## 2016-09-01 DIAGNOSIS — S3210XD Unspecified fracture of sacrum, subsequent encounter for fracture with routine healing: Secondary | ICD-10-CM | POA: Diagnosis not present

## 2016-09-03 DIAGNOSIS — N390 Urinary tract infection, site not specified: Secondary | ICD-10-CM | POA: Diagnosis not present

## 2016-09-05 DIAGNOSIS — J449 Chronic obstructive pulmonary disease, unspecified: Secondary | ICD-10-CM | POA: Diagnosis not present

## 2016-09-05 DIAGNOSIS — I10 Essential (primary) hypertension: Secondary | ICD-10-CM | POA: Diagnosis not present

## 2016-09-05 DIAGNOSIS — H8110 Benign paroxysmal vertigo, unspecified ear: Secondary | ICD-10-CM | POA: Diagnosis not present

## 2016-09-05 DIAGNOSIS — S3210XD Unspecified fracture of sacrum, subsequent encounter for fracture with routine healing: Secondary | ICD-10-CM | POA: Diagnosis not present

## 2016-09-05 DIAGNOSIS — J9611 Chronic respiratory failure with hypoxia: Secondary | ICD-10-CM | POA: Diagnosis not present

## 2016-09-05 DIAGNOSIS — E1142 Type 2 diabetes mellitus with diabetic polyneuropathy: Secondary | ICD-10-CM | POA: Diagnosis not present

## 2016-09-07 DIAGNOSIS — S3210XD Unspecified fracture of sacrum, subsequent encounter for fracture with routine healing: Secondary | ICD-10-CM | POA: Diagnosis not present

## 2016-09-07 DIAGNOSIS — E1142 Type 2 diabetes mellitus with diabetic polyneuropathy: Secondary | ICD-10-CM | POA: Diagnosis not present

## 2016-09-07 DIAGNOSIS — J9611 Chronic respiratory failure with hypoxia: Secondary | ICD-10-CM | POA: Diagnosis not present

## 2016-09-07 DIAGNOSIS — H8110 Benign paroxysmal vertigo, unspecified ear: Secondary | ICD-10-CM | POA: Diagnosis not present

## 2016-09-07 DIAGNOSIS — I10 Essential (primary) hypertension: Secondary | ICD-10-CM | POA: Diagnosis not present

## 2016-09-07 DIAGNOSIS — J449 Chronic obstructive pulmonary disease, unspecified: Secondary | ICD-10-CM | POA: Diagnosis not present

## 2016-09-12 DIAGNOSIS — J449 Chronic obstructive pulmonary disease, unspecified: Secondary | ICD-10-CM | POA: Diagnosis not present

## 2016-09-12 DIAGNOSIS — S3210XD Unspecified fracture of sacrum, subsequent encounter for fracture with routine healing: Secondary | ICD-10-CM | POA: Diagnosis not present

## 2016-09-12 DIAGNOSIS — I10 Essential (primary) hypertension: Secondary | ICD-10-CM | POA: Diagnosis not present

## 2016-09-12 DIAGNOSIS — J9611 Chronic respiratory failure with hypoxia: Secondary | ICD-10-CM | POA: Diagnosis not present

## 2016-09-12 DIAGNOSIS — H8110 Benign paroxysmal vertigo, unspecified ear: Secondary | ICD-10-CM | POA: Diagnosis not present

## 2016-09-12 DIAGNOSIS — E1142 Type 2 diabetes mellitus with diabetic polyneuropathy: Secondary | ICD-10-CM | POA: Diagnosis not present

## 2016-09-13 DIAGNOSIS — N390 Urinary tract infection, site not specified: Secondary | ICD-10-CM | POA: Diagnosis not present

## 2016-09-13 DIAGNOSIS — Z79899 Other long term (current) drug therapy: Secondary | ICD-10-CM | POA: Diagnosis not present

## 2016-09-13 DIAGNOSIS — R413 Other amnesia: Secondary | ICD-10-CM | POA: Diagnosis not present

## 2016-09-13 DIAGNOSIS — E119 Type 2 diabetes mellitus without complications: Secondary | ICD-10-CM | POA: Diagnosis not present

## 2016-09-13 DIAGNOSIS — I5032 Chronic diastolic (congestive) heart failure: Secondary | ICD-10-CM | POA: Diagnosis not present

## 2016-09-13 DIAGNOSIS — S3210XA Unspecified fracture of sacrum, initial encounter for closed fracture: Secondary | ICD-10-CM | POA: Diagnosis not present

## 2016-09-13 DIAGNOSIS — J9610 Chronic respiratory failure, unspecified whether with hypoxia or hypercapnia: Secondary | ICD-10-CM | POA: Diagnosis not present

## 2016-09-13 DIAGNOSIS — E039 Hypothyroidism, unspecified: Secondary | ICD-10-CM | POA: Diagnosis not present

## 2016-09-15 DIAGNOSIS — I10 Essential (primary) hypertension: Secondary | ICD-10-CM | POA: Diagnosis not present

## 2016-09-15 DIAGNOSIS — S3210XD Unspecified fracture of sacrum, subsequent encounter for fracture with routine healing: Secondary | ICD-10-CM | POA: Diagnosis not present

## 2016-09-15 DIAGNOSIS — H8110 Benign paroxysmal vertigo, unspecified ear: Secondary | ICD-10-CM | POA: Diagnosis not present

## 2016-09-15 DIAGNOSIS — J9611 Chronic respiratory failure with hypoxia: Secondary | ICD-10-CM | POA: Diagnosis not present

## 2016-09-15 DIAGNOSIS — J449 Chronic obstructive pulmonary disease, unspecified: Secondary | ICD-10-CM | POA: Diagnosis not present

## 2016-09-15 DIAGNOSIS — E1142 Type 2 diabetes mellitus with diabetic polyneuropathy: Secondary | ICD-10-CM | POA: Diagnosis not present

## 2016-09-19 DIAGNOSIS — E1142 Type 2 diabetes mellitus with diabetic polyneuropathy: Secondary | ICD-10-CM | POA: Diagnosis not present

## 2016-09-19 DIAGNOSIS — J449 Chronic obstructive pulmonary disease, unspecified: Secondary | ICD-10-CM | POA: Diagnosis not present

## 2016-09-19 DIAGNOSIS — J9611 Chronic respiratory failure with hypoxia: Secondary | ICD-10-CM | POA: Diagnosis not present

## 2016-09-19 DIAGNOSIS — H8110 Benign paroxysmal vertigo, unspecified ear: Secondary | ICD-10-CM | POA: Diagnosis not present

## 2016-09-19 DIAGNOSIS — I10 Essential (primary) hypertension: Secondary | ICD-10-CM | POA: Diagnosis not present

## 2016-09-19 DIAGNOSIS — S3210XD Unspecified fracture of sacrum, subsequent encounter for fracture with routine healing: Secondary | ICD-10-CM | POA: Diagnosis not present

## 2016-09-22 DIAGNOSIS — H8110 Benign paroxysmal vertigo, unspecified ear: Secondary | ICD-10-CM | POA: Diagnosis not present

## 2016-09-22 DIAGNOSIS — J9611 Chronic respiratory failure with hypoxia: Secondary | ICD-10-CM | POA: Diagnosis not present

## 2016-09-22 DIAGNOSIS — I10 Essential (primary) hypertension: Secondary | ICD-10-CM | POA: Diagnosis not present

## 2016-09-22 DIAGNOSIS — J449 Chronic obstructive pulmonary disease, unspecified: Secondary | ICD-10-CM | POA: Diagnosis not present

## 2016-09-22 DIAGNOSIS — E1142 Type 2 diabetes mellitus with diabetic polyneuropathy: Secondary | ICD-10-CM | POA: Diagnosis not present

## 2016-09-22 DIAGNOSIS — S3210XD Unspecified fracture of sacrum, subsequent encounter for fracture with routine healing: Secondary | ICD-10-CM | POA: Diagnosis not present

## 2016-09-28 DIAGNOSIS — J449 Chronic obstructive pulmonary disease, unspecified: Secondary | ICD-10-CM | POA: Diagnosis not present

## 2016-09-28 DIAGNOSIS — S3210XD Unspecified fracture of sacrum, subsequent encounter for fracture with routine healing: Secondary | ICD-10-CM | POA: Diagnosis not present

## 2016-09-28 DIAGNOSIS — E1142 Type 2 diabetes mellitus with diabetic polyneuropathy: Secondary | ICD-10-CM | POA: Diagnosis not present

## 2016-09-28 DIAGNOSIS — J9611 Chronic respiratory failure with hypoxia: Secondary | ICD-10-CM | POA: Diagnosis not present

## 2016-09-28 DIAGNOSIS — H8110 Benign paroxysmal vertigo, unspecified ear: Secondary | ICD-10-CM | POA: Diagnosis not present

## 2016-09-28 DIAGNOSIS — I10 Essential (primary) hypertension: Secondary | ICD-10-CM | POA: Diagnosis not present

## 2016-10-04 DIAGNOSIS — E1142 Type 2 diabetes mellitus with diabetic polyneuropathy: Secondary | ICD-10-CM | POA: Diagnosis not present

## 2016-10-04 DIAGNOSIS — I10 Essential (primary) hypertension: Secondary | ICD-10-CM | POA: Diagnosis not present

## 2016-10-04 DIAGNOSIS — J449 Chronic obstructive pulmonary disease, unspecified: Secondary | ICD-10-CM | POA: Diagnosis not present

## 2016-10-04 DIAGNOSIS — S3210XD Unspecified fracture of sacrum, subsequent encounter for fracture with routine healing: Secondary | ICD-10-CM | POA: Diagnosis not present

## 2016-10-04 DIAGNOSIS — H8110 Benign paroxysmal vertigo, unspecified ear: Secondary | ICD-10-CM | POA: Diagnosis not present

## 2016-10-04 DIAGNOSIS — J9611 Chronic respiratory failure with hypoxia: Secondary | ICD-10-CM | POA: Diagnosis not present

## 2016-10-07 DIAGNOSIS — J9611 Chronic respiratory failure with hypoxia: Secondary | ICD-10-CM | POA: Diagnosis not present

## 2016-10-07 DIAGNOSIS — S3210XD Unspecified fracture of sacrum, subsequent encounter for fracture with routine healing: Secondary | ICD-10-CM | POA: Diagnosis not present

## 2016-10-07 DIAGNOSIS — I10 Essential (primary) hypertension: Secondary | ICD-10-CM | POA: Diagnosis not present

## 2016-10-07 DIAGNOSIS — J449 Chronic obstructive pulmonary disease, unspecified: Secondary | ICD-10-CM | POA: Diagnosis not present

## 2016-10-07 DIAGNOSIS — E1142 Type 2 diabetes mellitus with diabetic polyneuropathy: Secondary | ICD-10-CM | POA: Diagnosis not present

## 2016-10-07 DIAGNOSIS — H8110 Benign paroxysmal vertigo, unspecified ear: Secondary | ICD-10-CM | POA: Diagnosis not present

## 2016-10-08 ENCOUNTER — Emergency Department (HOSPITAL_COMMUNITY): Payer: Medicare Other

## 2016-10-08 ENCOUNTER — Encounter (HOSPITAL_COMMUNITY): Payer: Self-pay

## 2016-10-08 ENCOUNTER — Inpatient Hospital Stay (HOSPITAL_COMMUNITY)
Admission: EM | Admit: 2016-10-08 | Discharge: 2016-10-12 | DRG: 193 | Disposition: A | Discharge status: Skilled Nursing Facility | Payer: Medicare Other | Attending: Family Medicine | Admitting: Family Medicine

## 2016-10-08 DIAGNOSIS — J440 Chronic obstructive pulmonary disease with acute lower respiratory infection: Secondary | ICD-10-CM | POA: Diagnosis present

## 2016-10-08 DIAGNOSIS — I110 Hypertensive heart disease with heart failure: Secondary | ICD-10-CM | POA: Diagnosis present

## 2016-10-08 DIAGNOSIS — I5032 Chronic diastolic (congestive) heart failure: Secondary | ICD-10-CM | POA: Diagnosis present

## 2016-10-08 DIAGNOSIS — Z888 Allergy status to other drugs, medicaments and biological substances status: Secondary | ICD-10-CM | POA: Diagnosis not present

## 2016-10-08 DIAGNOSIS — Z79899 Other long term (current) drug therapy: Secondary | ICD-10-CM | POA: Diagnosis not present

## 2016-10-08 DIAGNOSIS — I5020 Unspecified systolic (congestive) heart failure: Secondary | ICD-10-CM | POA: Diagnosis not present

## 2016-10-08 DIAGNOSIS — I272 Pulmonary hypertension, unspecified: Secondary | ICD-10-CM | POA: Diagnosis present

## 2016-10-08 DIAGNOSIS — E039 Hypothyroidism, unspecified: Secondary | ICD-10-CM | POA: Diagnosis present

## 2016-10-08 DIAGNOSIS — Z7984 Long term (current) use of oral hypoglycemic drugs: Secondary | ICD-10-CM | POA: Diagnosis not present

## 2016-10-08 DIAGNOSIS — Z8041 Family history of malignant neoplasm of ovary: Secondary | ICD-10-CM

## 2016-10-08 DIAGNOSIS — J181 Lobar pneumonia, unspecified organism: Secondary | ICD-10-CM

## 2016-10-08 DIAGNOSIS — R069 Unspecified abnormalities of breathing: Secondary | ICD-10-CM | POA: Diagnosis not present

## 2016-10-08 DIAGNOSIS — Z79891 Long term (current) use of opiate analgesic: Secondary | ICD-10-CM | POA: Diagnosis not present

## 2016-10-08 DIAGNOSIS — R402252 Coma scale, best verbal response, oriented, at arrival to emergency department: Secondary | ICD-10-CM | POA: Diagnosis present

## 2016-10-08 DIAGNOSIS — Z885 Allergy status to narcotic agent status: Secondary | ICD-10-CM

## 2016-10-08 DIAGNOSIS — Z9981 Dependence on supplemental oxygen: Secondary | ICD-10-CM | POA: Diagnosis not present

## 2016-10-08 DIAGNOSIS — G609 Hereditary and idiopathic neuropathy, unspecified: Secondary | ICD-10-CM | POA: Diagnosis present

## 2016-10-08 DIAGNOSIS — R0600 Dyspnea, unspecified: Secondary | ICD-10-CM | POA: Diagnosis not present

## 2016-10-08 DIAGNOSIS — R0602 Shortness of breath: Secondary | ICD-10-CM | POA: Diagnosis not present

## 2016-10-08 DIAGNOSIS — R748 Abnormal levels of other serum enzymes: Secondary | ICD-10-CM | POA: Diagnosis not present

## 2016-10-08 DIAGNOSIS — E1151 Type 2 diabetes mellitus with diabetic peripheral angiopathy without gangrene: Secondary | ICD-10-CM | POA: Diagnosis present

## 2016-10-08 DIAGNOSIS — F419 Anxiety disorder, unspecified: Secondary | ICD-10-CM | POA: Diagnosis present

## 2016-10-08 DIAGNOSIS — J189 Pneumonia, unspecified organism: Principal | ICD-10-CM | POA: Diagnosis present

## 2016-10-08 DIAGNOSIS — M810 Age-related osteoporosis without current pathological fracture: Secondary | ICD-10-CM | POA: Diagnosis present

## 2016-10-08 DIAGNOSIS — I481 Persistent atrial fibrillation: Secondary | ICD-10-CM | POA: Diagnosis present

## 2016-10-08 DIAGNOSIS — E785 Hyperlipidemia, unspecified: Secondary | ICD-10-CM | POA: Diagnosis present

## 2016-10-08 DIAGNOSIS — E873 Alkalosis: Secondary | ICD-10-CM | POA: Diagnosis present

## 2016-10-08 DIAGNOSIS — R402362 Coma scale, best motor response, obeys commands, at arrival to emergency department: Secondary | ICD-10-CM | POA: Diagnosis present

## 2016-10-08 DIAGNOSIS — Z808 Family history of malignant neoplasm of other organs or systems: Secondary | ICD-10-CM

## 2016-10-08 DIAGNOSIS — Z881 Allergy status to other antibiotic agents status: Secondary | ICD-10-CM | POA: Diagnosis not present

## 2016-10-08 DIAGNOSIS — E038 Other specified hypothyroidism: Secondary | ICD-10-CM | POA: Diagnosis present

## 2016-10-08 DIAGNOSIS — Z7982 Long term (current) use of aspirin: Secondary | ICD-10-CM | POA: Diagnosis not present

## 2016-10-08 DIAGNOSIS — Z87891 Personal history of nicotine dependence: Secondary | ICD-10-CM | POA: Diagnosis not present

## 2016-10-08 DIAGNOSIS — I959 Hypotension, unspecified: Secondary | ICD-10-CM | POA: Diagnosis present

## 2016-10-08 DIAGNOSIS — J9621 Acute and chronic respiratory failure with hypoxia: Secondary | ICD-10-CM | POA: Diagnosis present

## 2016-10-08 DIAGNOSIS — E1140 Type 2 diabetes mellitus with diabetic neuropathy, unspecified: Secondary | ICD-10-CM | POA: Diagnosis not present

## 2016-10-08 DIAGNOSIS — K219 Gastro-esophageal reflux disease without esophagitis: Secondary | ICD-10-CM | POA: Diagnosis present

## 2016-10-08 DIAGNOSIS — I4891 Unspecified atrial fibrillation: Secondary | ICD-10-CM | POA: Diagnosis not present

## 2016-10-08 DIAGNOSIS — I739 Peripheral vascular disease, unspecified: Secondary | ICD-10-CM | POA: Diagnosis not present

## 2016-10-08 DIAGNOSIS — Z841 Family history of disorders of kidney and ureter: Secondary | ICD-10-CM

## 2016-10-08 DIAGNOSIS — R402142 Coma scale, eyes open, spontaneous, at arrival to emergency department: Secondary | ICD-10-CM | POA: Diagnosis present

## 2016-10-08 DIAGNOSIS — Z7951 Long term (current) use of inhaled steroids: Secondary | ICD-10-CM

## 2016-10-08 DIAGNOSIS — J9601 Acute respiratory failure with hypoxia: Secondary | ICD-10-CM | POA: Diagnosis not present

## 2016-10-08 DIAGNOSIS — J9600 Acute respiratory failure, unspecified whether with hypoxia or hypercapnia: Secondary | ICD-10-CM | POA: Diagnosis not present

## 2016-10-08 DIAGNOSIS — Z8249 Family history of ischemic heart disease and other diseases of the circulatory system: Secondary | ICD-10-CM

## 2016-10-08 DIAGNOSIS — Z882 Allergy status to sulfonamides status: Secondary | ICD-10-CM

## 2016-10-08 DIAGNOSIS — Z66 Do not resuscitate: Secondary | ICD-10-CM | POA: Diagnosis present

## 2016-10-08 DIAGNOSIS — M4800 Spinal stenosis, site unspecified: Secondary | ICD-10-CM | POA: Diagnosis present

## 2016-10-08 DIAGNOSIS — Z90710 Acquired absence of both cervix and uterus: Secondary | ICD-10-CM

## 2016-10-08 DIAGNOSIS — Z9849 Cataract extraction status, unspecified eye: Secondary | ICD-10-CM

## 2016-10-08 DIAGNOSIS — Z9181 History of falling: Secondary | ICD-10-CM

## 2016-10-08 LAB — COMPREHENSIVE METABOLIC PANEL
ALBUMIN: 3.4 g/dL — AB (ref 3.5–5.0)
ALT: 10 U/L — ABNORMAL LOW (ref 14–54)
ANION GAP: 11 (ref 5–15)
AST: 14 U/L — ABNORMAL LOW (ref 15–41)
Alkaline Phosphatase: 100 U/L (ref 38–126)
BILIRUBIN TOTAL: 1.1 mg/dL (ref 0.3–1.2)
BUN: 9 mg/dL (ref 6–20)
CO2: 24 mmol/L (ref 22–32)
Calcium: 8.3 mg/dL — ABNORMAL LOW (ref 8.9–10.3)
Chloride: 103 mmol/L (ref 101–111)
Creatinine, Ser: 0.73 mg/dL (ref 0.44–1.00)
GFR calc Af Amer: >60 mL/min (ref >60)
GFR calc non Af Amer: >60 mL/min (ref >60)
GLUCOSE: 148 mg/dL — AB (ref 65–99)
POTASSIUM: 3.1 mmol/L — AB (ref 3.5–5.1)
Sodium: 138 mmol/L (ref 135–145)
TOTAL PROTEIN: 6.5 g/dL (ref 6.5–8.1)

## 2016-10-08 LAB — URINALYSIS, ROUTINE W REFLEX MICROSCOPIC
Bilirubin Urine: NEGATIVE
GLUCOSE, UA: 50 mg/dL — AB
Hgb urine dipstick: NEGATIVE
Ketones, ur: NEGATIVE mg/dL
LEUKOCYTES UA: NEGATIVE
Nitrite: NEGATIVE
PH: 7 (ref 5.0–8.0)
Protein, ur: 30 mg/dL — AB
SPECIFIC GRAVITY, URINE: 1.011 (ref 1.005–1.030)

## 2016-10-08 LAB — CBC WITH DIFFERENTIAL/PLATELET
BASOS ABS: 0 10*3/uL (ref 0.0–0.1)
Basophils Relative: 0 %
Eosinophils Absolute: 0 10*3/uL (ref 0.0–0.7)
Eosinophils Relative: 0 %
HEMATOCRIT: 35.2 % — AB (ref 36.0–46.0)
Hemoglobin: 11.5 g/dL — ABNORMAL LOW (ref 12.0–15.0)
LYMPHS PCT: 8 %
Lymphs Abs: 0.9 10*3/uL (ref 0.7–4.0)
MCH: 31 pg (ref 26.0–34.0)
MCHC: 32.7 g/dL (ref 30.0–36.0)
MCV: 94.9 fL (ref 78.0–100.0)
Monocytes Absolute: 1 10*3/uL (ref 0.1–1.0)
Monocytes Relative: 9 %
NEUTROS ABS: 9.7 10*3/uL — AB (ref 1.7–7.7)
Neutrophils Relative %: 83 %
Platelets: 166 10*3/uL (ref 150–400)
RBC: 3.71 MIL/uL — AB (ref 3.87–5.11)
RDW: 13.5 % (ref 11.5–15.5)
WBC: 11.6 10*3/uL — AB (ref 4.0–10.5)

## 2016-10-08 LAB — I-STAT CG4 LACTIC ACID, ED: LACTIC ACID, VENOUS: 1.1 mmol/L (ref 0.5–1.9)

## 2016-10-08 LAB — GLUCOSE, CAPILLARY
Glucose-Capillary: 145 mg/dL — ABNORMAL HIGH (ref 65–99)
Glucose-Capillary: 213 mg/dL — ABNORMAL HIGH (ref 65–99)

## 2016-10-08 LAB — I-STAT TROPONIN, ED: Troponin i, poc: 0.03 ng/mL (ref 0.00–0.08)

## 2016-10-08 LAB — TROPONIN I: TROPONIN I: 0.06 ng/mL — AB (ref <0.03)

## 2016-10-08 LAB — I-STAT ARTERIAL BLOOD GAS, ED
Acid-Base Excess: 1 mmol/L (ref 0.0–2.0)
Bicarbonate: 24.3 mmol/L (ref 20.0–28.0)
O2 SAT: 85 %
PCO2 ART: 33.2 mmHg (ref 32.0–48.0)
PH ART: 7.476 — AB (ref 7.350–7.450)
Patient temperature: 100.7
TCO2: 25 mmol/L (ref 0–100)
pO2, Arterial: 49 mmHg — ABNORMAL LOW (ref 83.0–108.0)

## 2016-10-08 LAB — BRAIN NATRIURETIC PEPTIDE: B Natriuretic Peptide: 1073.7 pg/mL — ABNORMAL HIGH (ref 0.0–100.0)

## 2016-10-08 MED ORDER — SODIUM CHLORIDE 0.9% FLUSH: 3.0000 mL | INTRAVENOUS | Status: DC | PRN

## 2016-10-08 MED ORDER — MOMETASONE FURO-FORMOTEROL FUM 200-5 MCG/ACT IN AERO
2.0000 | INHALATION_SPRAY | Freq: Two times a day (BID) | RESPIRATORY_TRACT | Status: DC
  Administered 2016-10-09 – 2016-10-12 (×6): 2 via RESPIRATORY_TRACT
  Filled 2016-10-08: qty 8.8

## 2016-10-08 MED ORDER — SODIUM CHLORIDE 0.9 % IV BOLUS (SEPSIS)
1000.0000 mL | Freq: Once | INTRAVENOUS | Status: AC
  Administered 2016-10-08: 1000 mL via INTRAVENOUS

## 2016-10-08 MED ORDER — DEXTROSE 5 % IV SOLN
250.0000 mg | INTRAVENOUS | Status: DC
  Administered 2016-10-09: 250 mg via INTRAVENOUS
  Filled 2016-10-08 (×2): qty 250

## 2016-10-08 MED ORDER — DEXTROSE 5 % IV SOLN
1.0000 g | Freq: Once | INTRAVENOUS | Status: AC
  Administered 2016-10-08: 1 g via INTRAVENOUS
  Filled 2016-10-08: qty 10

## 2016-10-08 MED ORDER — PANTOPRAZOLE SODIUM 40 MG PO TBEC
40.0000 mg | DELAYED_RELEASE_TABLET | Freq: Every day | ORAL | Status: DC
  Administered 2016-10-08 – 2016-10-12 (×5): 40 mg via ORAL
  Filled 2016-10-08 (×5): qty 1

## 2016-10-08 MED ORDER — LOSARTAN POTASSIUM 50 MG PO TABS: 100.0000 mg | ORAL_TABLET | Freq: Every day | ORAL | Status: DC

## 2016-10-08 MED ORDER — ORAL CARE MOUTH RINSE
15.0000 mL | Freq: Two times a day (BID) | OROMUCOSAL | Status: DC
  Administered 2016-10-08 – 2016-10-12 (×8): 15 mL via OROMUCOSAL

## 2016-10-08 MED ORDER — ACETAMINOPHEN 650 MG RE SUPP: 650.0000 mg | Freq: Four times a day (QID) | RECTAL | Status: DC | PRN

## 2016-10-08 MED ORDER — SENNA 8.6 MG PO TABS
1.0000 | ORAL_TABLET | Freq: Two times a day (BID) | ORAL | Status: DC
  Administered 2016-10-08 – 2016-10-12 (×8): 8.6 mg via ORAL
  Filled 2016-10-08 (×8): qty 1

## 2016-10-08 MED ORDER — SODIUM CHLORIDE 0.9 % IV SOLN: 250.0000 mL | INTRAVENOUS | Status: DC | PRN

## 2016-10-08 MED ORDER — ACETAMINOPHEN 325 MG PO TABS
650.0000 mg | ORAL_TABLET | Freq: Once | ORAL | Status: AC
  Administered 2016-10-08: 650 mg via ORAL
  Filled 2016-10-08: qty 2

## 2016-10-08 MED ORDER — SODIUM CHLORIDE 0.9% FLUSH
3.0000 mL | Freq: Two times a day (BID) | INTRAVENOUS | Status: DC
  Administered 2016-10-08 – 2016-10-12 (×8): 3 mL via INTRAVENOUS

## 2016-10-08 MED ORDER — DILTIAZEM HCL ER 180 MG PO CP24
180.0000 mg | ORAL_CAPSULE | Freq: Every day | ORAL | Status: DC
  Filled 2016-10-08: qty 1

## 2016-10-08 MED ORDER — ATORVASTATIN CALCIUM 40 MG PO TABS
40.0000 mg | ORAL_TABLET | Freq: Every day | ORAL | Status: DC
  Administered 2016-10-08 – 2016-10-11 (×4): 40 mg via ORAL
  Filled 2016-10-08 (×4): qty 1

## 2016-10-08 MED ORDER — METOPROLOL TARTRATE 25 MG PO TABS: 25.0000 mg | ORAL_TABLET | Freq: Two times a day (BID) | ORAL | Status: DC

## 2016-10-08 MED ORDER — METOPROLOL TARTRATE 12.5 MG HALF TABLET
12.5000 mg | ORAL_TABLET | Freq: Two times a day (BID) | ORAL | Status: DC
  Administered 2016-10-09 – 2016-10-11 (×7): 12.5 mg via ORAL
  Filled 2016-10-08 (×6): qty 1

## 2016-10-08 MED ORDER — INSULIN ASPART 100 UNIT/ML ~~LOC~~ SOLN
0.0000 [IU] | Freq: Every day | SUBCUTANEOUS | Status: DC
  Administered 2016-10-08: 2 [IU] via SUBCUTANEOUS

## 2016-10-08 MED ORDER — DEXTROSE 5 % IV SOLN
500.0000 mg | Freq: Once | INTRAVENOUS | Status: AC
  Administered 2016-10-08: 500 mg via INTRAVENOUS
  Filled 2016-10-08: qty 500

## 2016-10-08 MED ORDER — FUROSEMIDE 10 MG/ML IJ SOLN
20.0000 mg | Freq: Every day | INTRAMUSCULAR | Status: DC
  Administered 2016-10-08 – 2016-10-10 (×3): 20 mg via INTRAVENOUS
  Filled 2016-10-08 (×3): qty 2

## 2016-10-08 MED ORDER — INSULIN ASPART 100 UNIT/ML ~~LOC~~ SOLN
0.0000 [IU] | Freq: Three times a day (TID) | SUBCUTANEOUS | Status: DC
  Administered 2016-10-09: 1 [IU] via SUBCUTANEOUS
  Administered 2016-10-09 – 2016-10-10 (×2): 2 [IU] via SUBCUTANEOUS
  Administered 2016-10-11 (×2): 1 [IU] via SUBCUTANEOUS

## 2016-10-08 MED ORDER — ALPRAZOLAM 0.25 MG PO TABS
0.2500 mg | ORAL_TABLET | Freq: Two times a day (BID) | ORAL | Status: DC | PRN
  Administered 2016-10-09 – 2016-10-11 (×2): 0.25 mg via ORAL
  Filled 2016-10-08 (×3): qty 1

## 2016-10-08 MED ORDER — DEXTROSE 5 % IV SOLN
1.0000 g | INTRAVENOUS | Status: DC
  Administered 2016-10-09: 1 g via INTRAVENOUS
  Filled 2016-10-08 (×2): qty 10

## 2016-10-08 MED ORDER — GABAPENTIN 600 MG PO TABS
600.0000 mg | ORAL_TABLET | Freq: Three times a day (TID) | ORAL | Status: DC
  Administered 2016-10-08 – 2016-10-12 (×11): 600 mg via ORAL
  Filled 2016-10-08 (×11): qty 1

## 2016-10-08 MED ORDER — ASPIRIN EC 81 MG PO TBEC
81.0000 mg | DELAYED_RELEASE_TABLET | Freq: Every day | ORAL | Status: DC
  Administered 2016-10-08 – 2016-10-12 (×5): 81 mg via ORAL
  Filled 2016-10-08 (×5): qty 1

## 2016-10-08 MED ORDER — ENSURE ENLIVE PO LIQD
237.0000 mL | Freq: Two times a day (BID) | ORAL | Status: DC
  Administered 2016-10-09 – 2016-10-10 (×4): 237 mL via ORAL

## 2016-10-08 MED ORDER — POLYETHYLENE GLYCOL 3350 17 G PO PACK: 17.0000 g | PACK | Freq: Every day | ORAL | Status: DC | PRN

## 2016-10-08 MED ORDER — ACETAMINOPHEN 325 MG PO TABS
650.0000 mg | ORAL_TABLET | Freq: Four times a day (QID) | ORAL | Status: DC | PRN
  Administered 2016-10-08 – 2016-10-11 (×3): 650 mg via ORAL
  Filled 2016-10-08 (×3): qty 2

## 2016-10-08 MED ORDER — LEVOTHYROXINE SODIUM 100 MCG PO TABS
100.0000 ug | ORAL_TABLET | Freq: Every day | ORAL | Status: DC
  Administered 2016-10-09 – 2016-10-12 (×4): 100 ug via ORAL
  Filled 2016-10-08 (×4): qty 1

## 2016-10-08 MED ORDER — ENOXAPARIN SODIUM 40 MG/0.4ML ~~LOC~~ SOLN
40.0000 mg | SUBCUTANEOUS | Status: DC
  Administered 2016-10-08 – 2016-10-11 (×4): 40 mg via SUBCUTANEOUS
  Filled 2016-10-08 (×4): qty 0.4

## 2016-10-08 MED ORDER — DEXTROSE 5 % IV SOLN: 500.0000 mg | INTRAVENOUS | Status: DC

## 2016-10-08 NOTE — Progress Notes (Signed)
Pharmacy Antibiotic Note  Tracy Richards is a 83 y.o. female admitted on 10/08/2016 with increased SOB.  Pharmacy has been consulted for azithromycin and ceftriaxone dosing for CAP.  Plan: Azithromycin 500 mg IV q24h Ceftriaxone 1 g IV q24h Recommend 7 days of treatment No renal adjustment needed Pharmacy signing off  Height: 5\' 3"  (160 cm) Weight: 130 lb (59 kg) IBW/kg (Calculated) : 52.4  Temp (24hrs), Avg:100.8 F (38.2 C), Min:100.8 F (38.2 C), Max:100.8 F (38.2 C)  No results for input(s): WBC, CREATININE, LATICACIDVEN, VANCOTROUGH, VANCOPEAK, VANCORANDOM, GENTTROUGH, GENTPEAK, GENTRANDOM, TOBRATROUGH, TOBRAPEAK, TOBRARND, AMIKACINPEAK, AMIKACINTROU, AMIKACIN in the last 168 hours.  CrCl cannot be calculated (Patient's most recent lab result is older than the maximum 21 days allowed.).    Allergies  Allergen Reactions  . Amlodipine Swelling  . Avelox [Moxifloxacin Hcl In Nacl] Other (See Comments)    G.I. Upset   . Carafate [Sucralfate] Itching    ulcers ulcers  . Celecoxib Other (See Comments)  . Diazepam Hives  . Doxazosin     Other reaction(s): Unknown  . Klonopin [Clonazepam] Hives  . Levofloxacin Cough    Choking Sensation  . Norvasc [Amlodipine Besylate] Swelling  . Pantoprazole Sodium Itching  . Ranitidine Itching  . Rofecoxib     Other reaction(s): Unknown  . Sulfa Antibiotics     Other reaction(s): Unknown Mouth Sores  . Sulfa Drugs Cross Reactors Other (See Comments)    Mouth Sores  . Codeine Anxiety    Other reaction(s): Unknown     Thank you for allowing pharmacy to be a part of this patient's care.  Renold Genta, PharmD, BCPS Clinical Pharmacist Phone for today - [place] - [phone] 10/08/2016 12:52 PM

## 2016-10-08 NOTE — ED Provider Notes (Signed)
[facility] DEPT Provider Note   CSN: [id number] Arrival date & time: 10/08/16  1214     History   Chief Complaint Chief Complaint  Patient presents with  . Shortness of Breath/BiPap    HPI Tracy Richards is a 83 y.o. female.  The history is provided by the patient.  Shortness of Breath  This is a new problem. The average episode lasts 2 hours. The problem occurs continuously.The current episode started 1 to 2 hours ago. The problem has been gradually improving. Associated symptoms include a fever (noted on arrival), cough and sputum production. It is unknown what precipitated the problem. She has tried nothing for the symptoms. She has had no prior hospitalizations. She has had no prior ED visits. Associated medical issues include heart failure.    Past Medical History:  Diagnosis Date  . Asthma   . Benign paroxysmal vertigo   . COPD (chronic obstructive pulmonary disease) ([place])   . Depression   . Diabetes ([place])   . GERD (gastroesophageal reflux disease)   . Gout   . Hyperlipidemia   . Hypertension   . Hypothyroid   . Osteoporosis   . Peripheral neuropathy ([place])   . PVD (peripheral vascular disease) ([place])    blochage in Right leg -- had stent placement  . Renal artery stenosis ([place])   . Spinal stenosis   . Spinal stenosis   . Unspecified hereditary and idiopathic peripheral neuropathy     Patient Active Problem List   Diagnosis Date Noted  . Sacral fracture, closed ([place]) 07/24/2016  . Chronic respiratory failure with hypoxia ([place]) 03/13/2015  . COPD bronchitis 10/20/2014  . Other specified hypothyroidism 04/01/2013  . HBP (high blood pressure) 03/19/2013    Past Surgical History:  Procedure Laterality Date  . ABDOMINAL HYSTERECTOMY    . APPENDECTOMY    . BACK SURGERY    . CATARACT EXTRACTION W/PHACO    . ILIAC ARTERY STENT    . KIDNEY SURGERY     ballooned multiple times  . TONSILLECTOMY    . VEIN SURGERY     VEIN STRIPPED    OB History    No  data available       Home Medications    Prior to Admission medications   Medication Sig Start Date End Date Taking? Authorizing Provider  acetaminophen (TYLENOL) 500 MG tablet Take 2 tablets (1,000 mg total) by mouth 3 (three) times daily. 07/26/16 07/26/17  Rudene Re, MD  aspirin EC 81 MG tablet Take 81 mg by mouth daily.    Historical Provider, MD  atorvastatin (LIPITOR) 40 MG tablet Take 40 mg by mouth at bedtime.     Historical Provider, MD  diltiazem (DILACOR XR) 180 MG 24 hr capsule Take 180 mg by mouth daily.    Historical Provider, MD  Fluticasone-Salmeterol (ADVAIR DISKUS) 250-50 MCG/DOSE AEPB Inhale 1 puff into the lungs 2 (two) times daily. 03/16/15   Tanda Rockers, MD  gabapentin (NEURONTIN) 600 MG tablet Take 600 mg by mouth 3 (three) times daily.    Historical Provider, MD  levothyroxine (SYNTHROID, LEVOTHROID) 100 MCG tablet Take 100 mcg by mouth daily. 10/19/14   Historical Provider, MD  losartan (COZAAR) 100 MG tablet Take 100 mg by mouth daily.    Historical Provider, MD  meclizine (ANTIVERT) 32 MG tablet Take 32 mg by mouth 3 (three) times daily as needed for dizziness.    Historical Provider, MD  metFORMIN (GLUCOPHAGE-XR) 500 MG 24 hr tablet Take 1,000 mg  by mouth daily.    Historical Provider, MD  metoprolol tartrate (LOPRESSOR) 25 MG tablet Take 25 mg by mouth 2 (two) times daily. 10/04/14   Historical Provider, MD  oxyCODONE (ROXICODONE) 5 MG immediate release tablet Take 1 tablet (5 mg total) by mouth every 6 (six) hours as needed for moderate pain, severe pain or breakthrough pain. 07/26/16 07/26/17  Rudene Re, MD  oxyCODONE-acetaminophen (ROXICET) 5-325 MG tablet Take 1-2 tablets by mouth every 6 (six) hours as needed. 07/24/16   Orbie Pyo, MD  pantoprazole (PROTONIX) 40 MG tablet Take 40 mg by mouth daily.    Historical Provider, MD  potassium chloride (K-DUR) 10 MEQ tablet Take 10 mEq by mouth daily.    Historical Provider, MD  senna  (SENOKOT) 8.6 MG tablet Take 1 tablet (8.6 mg total) by mouth daily. 07/26/16   Rudene Re, MD    Family History Family History  Problem Relation Age of Onset  . Sudden death Father     unknown causes  . Hypertension Mother   . Sudden death Mother     unknown causes  . Kidney disease Brother     Brights disease  . Bone cancer Brother   . Ovarian cancer Daughter     Social History Social History  Substance Use Topics  . Smoking status: Former Smoker    Packs/day: 0.25    Years: 20.00    Types: Cigarettes    Quit date: 10/03/1978  . Smokeless tobacco: Never Used  . Alcohol use No     Allergies   Amlodipine; Avelox [moxifloxacin hcl in nacl]; Carafate [sucralfate]; Celecoxib; Diazepam; Doxazosin; Klonopin [clonazepam]; Levofloxacin; Norvasc [amlodipine besylate]; Pantoprazole sodium; Ranitidine; Rofecoxib; Sulfa antibiotics; Sulfa drugs cross reactors; and Codeine   Review of Systems Review of Systems  Constitutional: Positive for fever (noted on arrival).  Respiratory: Positive for cough, sputum production and shortness of breath.   All other systems reviewed and are negative.    Physical Exam Updated Vital Signs BP 109/70 (BP Location: Right Arm)   Pulse 102   Temp 100.8 F (38.2 C) (Oral)   Resp 22   Ht 5\' 3"  (1.6 m)   Wt 130 lb (59 kg)   SpO2 96%   BMI 23.03 kg/m   Physical Exam  Constitutional: She is oriented to person, place, and time. She appears well-developed and well-nourished. She appears listless. She appears toxic. She appears distressed.  HENT:  Head: Normocephalic.  Nose: Nose normal.  Eyes: Conjunctivae are normal.  Neck: Neck supple. No tracheal deviation present.  Cardiovascular: Normal heart sounds.  An irregularly irregular rhythm present. Tachycardia present.   Pulmonary/Chest: Tachypnea noted. She is in respiratory distress. She has rhonchi (on left). She has rales (bilateral).  Abdominal: Soft. She exhibits no distension.    Neurological: She is oriented to person, place, and time. She appears listless. GCS eye subscore is 4. GCS verbal subscore is 5. GCS motor subscore is 6.  Skin: Skin is warm and dry.  Psychiatric: She has a normal mood and affect.     ED Treatments / Results  Labs (all labs ordered are listed, but only abnormal results are displayed) Labs Reviewed  CULTURE, BLOOD (ROUTINE X 2)  CULTURE, BLOOD (ROUTINE X 2)  URINE CULTURE  COMPREHENSIVE METABOLIC PANEL  CBC WITH DIFFERENTIAL/PLATELET  URINALYSIS, ROUTINE W REFLEX MICROSCOPIC  BRAIN NATRIURETIC PEPTIDE  I-STAT CG4 LACTIC ACID, ED  Randolm Idol, ED    EKG  EKG Interpretation  Date/Time:  Saturday October 08 2016 12:27:11 EST Ventricular Rate:  101 PR Interval:    QRS Duration: 93 QT Interval:  341 QTC Calculation: 442 R Axis:   104 Text Interpretation:  Atrial fibrillation Right axis deviation Anteroseptal infarct, old Nonspecific repol abnormality, lateral leads New since previous tracing Otherwise no significant change Confirmed by Tydarius Yawn MD, Maydelin Deming [phone]) on 10/08/2016 12:41:29 PM       Radiology Dg Chest Portable 1 View  Result Date: 10/08/2016 CLINICAL DATA:  Shortness of Breath EXAM: PORTABLE CHEST 1 VIEW COMPARISON:  07/02/2016 FINDINGS: Cardiac shadow is again enlarged in size. Aortic calcifications are seen. Lungs are well aerated bilaterally. Infiltrate is noted within the left mid and lower lung. No other focal infiltrate is seen. No bony abnormality is noted. IMPRESSION: Left-sided pneumonia Electronically Signed   By: Inez Catalina M.D.   On: 10/08/2016 12:52    Procedures Procedures (including critical care time)  CRITICAL CARE Performed by: Leo Grosser Total critical care time: 30 minutes Critical care time was exclusive of separately billable procedures and treating other patients. Critical care was necessary to treat or prevent imminent or life-threatening deterioration. Critical care was time spent  personally by me on the following activities: development of treatment plan with patient and/or surrogate as well as nursing, discussions with consultants, evaluation of patient's response to treatment, examination of patient, obtaining history from patient or surrogate, ordering and performing treatments and interventions, ordering and review of laboratory studies, ordering and review of radiographic studies, pulse oximetry and re-evaluation of patient's condition.   Emergency Focused Ultrasound Exam Limited Ultrasound of the Heart and Pericardium  Performed and interpreted by Dr. Laneta Simmers Indication: shortness of breath Multiple views of the heart, pericardium, and IVC are obtained with a multi frequency probe.  Findings: nml contractility, no anechoic fluid, partial IVC collapse Interpretation: nml ejection fraction, no pericardial effusion, no depressed CVP Images archived electronically.  CPT Code: [id number]   Medications Ordered in ED Medications  sodium chloride 0.9 % bolus 1,000 mL (not administered)    And  sodium chloride 0.9 % bolus 1,000 mL (not administered)  cefTRIAXone (ROCEPHIN) 1 g in dextrose 5 % 50 mL IVPB (not administered)  azithromycin (ZITHROMAX) 500 mg in dextrose 5 % 250 mL IVPB (not administered)  azithromycin (ZITHROMAX) 500 mg in dextrose 5 % 250 mL IVPB (not administered)  cefTRIAXone (ROCEPHIN) 1 g in dextrose 5 % 50 mL IVPB (not administered)     Initial Impression / Assessment and Plan / ED Course  I have reviewed the triage vital signs and the nursing notes.  Pertinent labs & imaging results that were available during my care of the patient were reviewed by me and considered in my medical decision making (see chart for details).  Clinical Course     83 y.o. female presents with cough and acute shortness of breath found obtunded and hypoxemic by EMS. Febrile and tachycardic on arrival, sepsis protocol with IV ABx and fluid resuscitation started with  presumed pneumonia confirmed on CXR. Started on BiPap for respiratory failure here, gas shows improvement from clinical status on arrival, weaned to nasal cannula. Family practice was consulted for admission and will see the patient in the emergency department.   Final Clinical Impressions(s) / ED Diagnoses   Final diagnoses:  Community acquired pneumonia of left upper lobe of lung ([place])  Acute respiratory failure with hypoxia [facility])    New Prescriptions New Prescriptions   No medications on file     Leo Grosser, MD 10/08/16  2057  

## 2016-10-08 NOTE — ED Notes (Signed)
Report to [facility]

## 2016-10-08 NOTE — Progress Notes (Signed)
Received from ED via stretcher. Tele applied. Family with patient.

## 2016-10-08 NOTE — Progress Notes (Signed)
New Admission Note:   Arrival Method: Via stretcher from the ED Mental Orientation:  A & O x 4 Telemetry: Tele Box [phone] Assessment: Completed Skin: IV: Pain: Denies Tubes: None Safety Measures: Safety Fall Prevention Plan has been given, discussed and signed Admission: Completed [facility] [place] Orientation: Patient has been orientated to the room, unit and staff.  Family:  Daughter, [place] Ravens, and son-in-law at bedside  Upper partials, eye glasses, and left hearing aid were left at home.  Her watch and bracelet were sent home with daughter.   Orders have been reviewed and implemented. Will continue to monitor the patient. Call light has been placed within reach and bed alarm has been activated.   Earleen Reaper RN- [place], [place] Phone number: [phone]

## 2016-10-08 NOTE — ED Triage Notes (Signed)
Patient arrived from home where she lives alone with increased shortness of breath this am. EMS reports that they found patient on oxygen at 4L n/c sats 76%. Had vomited x 1 at home and received zofran 4mg  IV pta. On arrival alert and oriented, denies pain. States that she has been feeling poorly with chills and increased cough x 3 days. Atrial fib on monitor, hx of same. RT at bedside on arrival and placed on Bi-Pap.

## 2016-10-08 NOTE — ED Notes (Signed)
BiPap discontinued by RT and switched to 4L n/c. Patient maintaining sats and states that she feels as if it is easier to breath. Denies pain. Family remains at bedside

## 2016-10-08 NOTE — Progress Notes (Signed)
RT took pt off Bipap after speaking to the MD due to agitation and increased RR.  Pt placed on nasal canula 4 L, sat 97%. Family at bedside.

## 2016-10-08 NOTE — Progress Notes (Signed)
Pt. States she does not wear cpap or bipap at [facility]. Pt. States she only wears her O2.

## 2016-10-08 NOTE — H&P (Signed)
[facility] Admission History and Physical Service Pager: [phone]  Patient name: Tracy Richards record number: [id number] Date of birth: Jul 01, 1929 Age: 83 y.o. Gender: female  Primary Care Provider: Cyndi Bender, PA-C Consultants: none Code Status: DO NOT RESUSCITATE (patient prefers to be DO NOT RESUSCITATE but she states her daughters may not like this)  Chief Complaint: cough  Assessment and Plan: Tracy Richards is a 83 y.o. female presenting with productive cough and dyspnea. PMH is significant for COPD, atrial fibrillation, hypertension, hypothyroidism, hyperlipidemia, PVD  Dyspnea and cough: Likely due to community-acquired pneumonia. No recent antibiotic exposure or health care contact. Exam with bibasilar crackles, left greater than right . No significant increased work of breathing on nasal cannula. CXR with a LLL pneumonia. Also increased reticular opacity in RML but no sign of fluid overload or effusion. Cannot rule out CHF with BNP in 1000. ACS unlikely without ischemic EKG changes. Point of care troponin negative as well. PE unlikely with risk score 0. Can't rule out COPD exacerbation also ABG suggestive for respiratory alkalosis. Opiates listed on her medication list but denies taking this.  -We will admit to telemetry. Attending Dr. Andria Frames. -Serial troponin and a.m. EKG to rule out ACS -Continue ceftriaxone and azithromycin. No QTC prolongation on EKG -CHF as below -Follow up blood and urine culture obtained in ED -Oxygen as needed -Monitor respiratory status  HFpEF: No sign of fluid overload. CXR without pulmonary edema. Last echo in 05/2007 with EF of 55-60% and no significant structural or functional abnormality. Dyspnea and cough likely due to pneumonia. However, patient of elevated BNP to 1000. Patient on torsemide 20-40 mg as needed -IV Lasix 20 mg daily. We will titrate as needed -Hold other home blood pressure medications for soft  blood pressure -Daily BNP -Echocardiogram  COPD: No formal diagnosis for this. Seen Dr. Christinia Gully in 03/2015 and started on trial of Advair. He was not convinced if she had COPD at that time -Hold home Advair -Start [facility] inpatient -Albuterol nebulizers every 4 hours as needed for shortness of breath -Monitor respiratory status -Oxygen as needed  Atrial fibrillation: Not in RVR. [place] Vas score 4 (8.5% risk). Has Bled 2 (4.1% risk of major bleeding). Rate controlled on diltiazem and metoprolol at home. Not on anticoagulation except baby aspirin.  - Continue metoprolol. Hold diltiazem for low blood pressure - Continue aspirin for now. Need discussion about anticoagulation given risk for fall  Hypertension: Normotensive -Continue home metoprolol. Hold home diltiazem and losartan for low blood pressure  DM-2: A1c 11.5. Doesn't appear to be on any medication for this.  -SSI-thin -Continue on gabapentin  Hypothyroidism: On Synthroid 100 mcg daily at home -Continue on Synthroid  Anxiety: On Xanax 0.5 mg twice a day as needed for anxiety -Continue Xanax at 0.25 mg twice a day when necessary  Chronic pain: On Percocet and oxycodone at home but denies taking -Tylenol when necessary  FEN/GI:  -Car modified diet -Protonix  Prophylaxis: Lovenox  Disposition: Telemetry pending clinical improvement. PT consult  History of Present Illness:  Tracy Richards is a 83 y.o. female presenting with cough and shortness of breath  Patient is a poor historian. No family member at bedside. She states that she doesn't remember feeling sick. However, she endorses productive cough with yellowish sputum recently. She denies hemoptysis or chest pain. She is not oriented to date to tell me how long this was going on. However, she says that her daughters were  concerned about her symptoms and decided to bring her to ED for evaluation.   In ED, she she was initially put on BiPAP for increased work of  breathing, and transitioned to nasal cannula. ABG consistent with respiratory alkalosis. CBC not significant for infectious etiology. Lactic acid negative. CMP normal except for hypokalemia. BNP 100K. point-of-care troponin negative. UA negative. CXR was left lobe pneumonia and also reticular opacity in RML. EKG was asked to fibrillation (old). Blood culture urine culture were drawn. She was given ceftriaxone and azithromycin. Family medicine was called to admit for pneumonia and respiratory distress. Review Of Systems:   Review of Systems  Constitutional: Negative for fever.  Respiratory: Positive for cough, sputum production and shortness of breath. Negative for hemoptysis.   Cardiovascular: Negative for chest pain.  Gastrointestinal: Negative for nausea and vomiting.  Genitourinary: Negative for dysuria.  Musculoskeletal: Positive for back pain.  Neurological: Negative for sensory change, speech change and headaches.  Psychiatric/Behavioral: Negative for substance abuse.    Patient Active Problem List   Diagnosis Date Noted  . CAP (community acquired pneumonia) 10/08/2016  . Shortness of breath 10/08/2016  . Sacral fracture, closed ([place]) 07/24/2016  . Chronic respiratory failure with hypoxia ([place]) 03/13/2015  . COPD bronchitis 10/20/2014  . Other specified hypothyroidism 04/01/2013  . HBP (high blood pressure) 03/19/2013    Past Medical History: Past Medical History:  Diagnosis Date  . Asthma   . Benign paroxysmal vertigo   . COPD (chronic obstructive pulmonary disease) ([place])   . Depression   . Diabetes ([place])   . GERD (gastroesophageal reflux disease)   . Gout   . Hyperlipidemia   . Hypertension   . Hypothyroid   . Osteoporosis   . Peripheral neuropathy ([place])   . PVD (peripheral vascular disease) ([place])    blochage in Right leg -- had stent placement  . Renal artery stenosis ([place])   . Spinal stenosis   . Spinal stenosis   . Unspecified hereditary and idiopathic  peripheral neuropathy     Past Surgical History: Past Surgical History:  Procedure Laterality Date  . ABDOMINAL HYSTERECTOMY    . APPENDECTOMY    . BACK SURGERY    . CATARACT EXTRACTION W/PHACO    . ILIAC ARTERY STENT    . KIDNEY SURGERY     ballooned multiple times  . TONSILLECTOMY    . VEIN SURGERY     VEIN STRIPPED    Social History: Social History  Substance Use Topics  . Smoking status: Former Smoker    Packs/day: 0.25    Years: 20.00    Types: Cigarettes    Quit date: 10/03/1978  . Smokeless tobacco: Never Used  . Alcohol use No   Additional social history: Please also refer to relevant sections of EMR.  Family History: Family History  Problem Relation Age of Onset  . Sudden death Father     unknown causes  . Hypertension Mother   . Sudden death Mother     unknown causes  . Kidney disease Brother     Brights disease  . Bone cancer Brother   . Ovarian cancer Daughter    (If not completed, MUST add something in)  Allergies and Medications: Allergies  Allergen Reactions  . Amlodipine Swelling  . Avelox [Moxifloxacin Hcl In Nacl] Other (See Comments)    G.I. Upset   . Carafate [Sucralfate] Itching    Ulcers also   . Celecoxib Other (See Comments)    Reaction unknown  .  Diazepam Hives  . Doxazosin Other (See Comments)    Reaction unknown  . Klonopin [Clonazepam] Hives  . Levofloxacin Cough    Choking Sensation  . Norvasc [Amlodipine Besylate] Swelling  . Pantoprazole Sodium Itching  . Ranitidine Itching  . Rofecoxib Other (See Comments)    Reaction unknown  . Shellfish Allergy Diarrhea  . Sulfa Antibiotics Other (See Comments)    Mouth Sores  . Sulfa Drugs Cross Reactors Other (See Comments)    Mouth Sores  . Codeine Anxiety    "Makes me crazy"  . Tape Rash and Other (See Comments)    SKIN IS VERY THIN; TEARS AND BRUISES VERY EASILY!!   No current facility-administered medications on file prior to encounter.    Current Outpatient  Prescriptions on File Prior to Encounter  Medication Sig Dispense Refill  . acetaminophen (TYLENOL) 500 MG tablet Take 2 tablets (1,000 mg total) by mouth 3 (three) times daily. (Patient taking differently: Take 500 mg by mouth every 8 (eight) hours as needed for mild pain, fever or headache. ) 100 tablet 2  . aspirin EC 81 MG tablet Take 81 mg by mouth daily.    Marland Kitchen atorvastatin (LIPITOR) 40 MG tablet Take 40 mg by mouth at bedtime.     Marland Kitchen diltiazem (DILACOR XR) 180 MG 24 hr capsule Take 180 mg by mouth daily.    . Fluticasone-Salmeterol (ADVAIR DISKUS) 250-50 MCG/DOSE AEPB Inhale 1 puff into the lungs 2 (two) times daily. 60 each 0  . gabapentin (NEURONTIN) 600 MG tablet Take 600 mg by mouth See admin instructions. Three to four times a day for neuropathy    . levothyroxine (SYNTHROID, LEVOTHROID) 100 MCG tablet Take 100 mcg by mouth daily.  4  . losartan (COZAAR) 100 MG tablet Take 100 mg by mouth daily.    . metoprolol tartrate (LOPRESSOR) 25 MG tablet Take 25 mg by mouth 2 (two) times daily.  4  . pantoprazole (PROTONIX) 40 MG tablet Take 40 mg by mouth daily.    Marland Kitchen senna (SENOKOT) 8.6 MG tablet Take 1 tablet (8.6 mg total) by mouth daily. 30 tablet 1    Objective: BP (!) 110/48 (BP Location: Left Arm)   Pulse 76   Temp 98.2 F (36.8 C) (Oral)   Resp 18   Ht 5\' 3"  (1.6 m)   Wt 59 kg (130 lb)   SpO2 96%   BMI 23.03 kg/m  Exam: GEN: frail appearing lady, lying in bed with nasal cannula in place, no apparent distress, able to sit up in bed with his little help. Head: normocephalic and atraumatic  Eyes: conjunctiva without injection, sclera anicteric CVS: RRR, s1 & s2 normal, no murmurs, no edema, cap refills < 2 secs RESP: On 4 L by NCno increased work of breathing, good air movement bilaterally, bibasilar crackles,  left greater than right, no wheezes GI: Bowel sounds present and normal, soft, non-tender, non-distended, no guarding, no rebound, no mass GU: no suprapubic or CVA  tenderness MSK: No focal tenderness or swelling SKIN: no apparent skin lesion NEURO: alert, follows commands, and oiented to self, person and place and year but not date and month, able to describe the president of [place] by his facial features, CN nerves, motor and sensory intact PSYCH: euthymic mood with congruent affect  Labs and Imaging: CBC BMET   Recent Labs Lab 10/08/16 1236  WBC 11.6*  HGB 11.5*  HCT 35.2*  PLT 166    Recent Labs Lab 10/08/16 1236  NA 138  K 3.1*  CL 103  CO2 24  BUN 9  CREATININE 0.73  GLUCOSE 148*  CALCIUM 8.3*      Mercy Riding, MD 10/08/2016, 7:07 PM PGY-2, [place] Intern pager: [phone], text pages welcome

## 2016-10-09 DIAGNOSIS — J181 Lobar pneumonia, unspecified organism: Secondary | ICD-10-CM

## 2016-10-09 DIAGNOSIS — R748 Abnormal levels of other serum enzymes: Secondary | ICD-10-CM

## 2016-10-09 DIAGNOSIS — J9601 Acute respiratory failure with hypoxia: Secondary | ICD-10-CM

## 2016-10-09 DIAGNOSIS — J189 Pneumonia, unspecified organism: Secondary | ICD-10-CM

## 2016-10-09 LAB — BASIC METABOLIC PANEL
Anion gap: 7 (ref 5–15)
BUN: 9 mg/dL (ref 6–20)
CHLORIDE: 105 mmol/L (ref 101–111)
CO2: 27 mmol/L (ref 22–32)
Calcium: 8.4 mg/dL — ABNORMAL LOW (ref 8.9–10.3)
Creatinine, Ser: 0.77 mg/dL (ref 0.44–1.00)
Glucose, Bld: 101 mg/dL — ABNORMAL HIGH (ref 65–99)
POTASSIUM: 2.9 mmol/L — AB (ref 3.5–5.1)
SODIUM: 139 mmol/L (ref 135–145)

## 2016-10-09 LAB — TROPONIN I
Troponin I: 0.04 ng/mL (ref <0.03)
Troponin I: 0.08 ng/mL (ref <0.03)

## 2016-10-09 LAB — HEMOGLOBIN A1C
Hgb A1c MFr Bld: 5.8 % — ABNORMAL HIGH (ref 4.8–5.6)
MEAN PLASMA GLUCOSE: 120 mg/dL

## 2016-10-09 LAB — CBC
HEMATOCRIT: 30.7 % — AB (ref 36.0–46.0)
Hemoglobin: 10 g/dL — ABNORMAL LOW (ref 12.0–15.0)
MCH: 31.3 pg (ref 26.0–34.0)
MCHC: 32.6 g/dL (ref 30.0–36.0)
MCV: 95.9 fL (ref 78.0–100.0)
Platelets: 132 10*3/uL — ABNORMAL LOW (ref 150–400)
RBC: 3.2 MIL/uL — AB (ref 3.87–5.11)
RDW: 14 % (ref 11.5–15.5)
WBC: 11.9 10*3/uL — AB (ref 4.0–10.5)

## 2016-10-09 LAB — GLUCOSE, CAPILLARY
GLUCOSE-CAPILLARY: 97 mg/dL (ref 65–99)
GLUCOSE-CAPILLARY: 128 mg/dL — AB (ref 65–99)
Glucose-Capillary: 127 mg/dL — ABNORMAL HIGH (ref 65–99)
Glucose-Capillary: 184 mg/dL — ABNORMAL HIGH (ref 65–99)

## 2016-10-09 LAB — URINE CULTURE: Culture: NO GROWTH

## 2016-10-09 LAB — TSH: TSH: 0.603 u[IU]/mL (ref 0.350–4.500)

## 2016-10-09 MED ORDER — POTASSIUM CHLORIDE CRYS ER 20 MEQ PO TBCR
40.0000 meq | EXTENDED_RELEASE_TABLET | Freq: Two times a day (BID) | ORAL | Status: AC
  Administered 2016-10-09 – 2016-10-10 (×3): 40 meq via ORAL
  Filled 2016-10-09 (×3): qty 2

## 2016-10-09 NOTE — Consult Note (Signed)
Reason for Consult:elevated troponin  Referring Physician: Dr. Hensel/teaching service  Tracy Richards is an 83 y.o. female.   HPI: The patient is an 83 yo woman with persistent atrial fib, peripheral vascular disease, HTN, and dyslipidemia who was admitted with worsening sob and found to have a left sided pneumonia. Troponins were checked and found to be slightly elevated and we are consulted for additional evaluation. The patient has pleuritic chest pain on the left. She notes a productive cough and fever. She has been started on IV anti-biotics. She denies syncope. She is not anti-coagulated. Dr. Saralyn Pilar is her cardiologist and documented in 2016 that she was too high a fall risk for systemic anti-coagulation.   PMH: Past Medical History:  Diagnosis Date  . Asthma   . Benign paroxysmal vertigo   . COPD (chronic obstructive pulmonary disease) ([place])   . Depression   . Diabetes ([place])   . GERD (gastroesophageal reflux disease)   . Gout   . Hyperlipidemia   . Hypertension   . Hypothyroid   . Osteoporosis   . Peripheral neuropathy ([place])   . PVD (peripheral vascular disease) ([place])    blochage in Right leg -- had stent placement  . Renal artery stenosis ([place])   . Spinal stenosis   . Spinal stenosis   . Unspecified hereditary and idiopathic peripheral neuropathy     PSHX: Past Surgical History:  Procedure Laterality Date  . ABDOMINAL HYSTERECTOMY    . APPENDECTOMY    . BACK SURGERY    . CATARACT EXTRACTION W/PHACO    . ILIAC ARTERY STENT    . KIDNEY SURGERY     ballooned multiple times  . TONSILLECTOMY    . VEIN SURGERY     VEIN STRIPPED    FAMHX: Family History  Problem Relation Age of Onset  . Sudden death Father     unknown causes  . Hypertension Mother   . Sudden death Mother     unknown causes  . Kidney disease Brother     Brights disease  . Bone cancer Brother   . Ovarian cancer Daughter     Social History:  reports that she quit smoking about 38 years  ago. Her smoking use included Cigarettes. She has a 5.00 pack-year smoking history. She has never used smokeless tobacco. She reports that she does not drink alcohol or use drugs.  Allergies:  Allergies  Allergen Reactions  . Amlodipine Swelling  . Avelox [Moxifloxacin Hcl In Nacl] Other (See Comments)    G.I. Upset   . Carafate [Sucralfate] Itching    Ulcers also   . Celecoxib Other (See Comments)    Reaction unknown  . Diazepam Hives  . Doxazosin Other (See Comments)    Reaction unknown  . Klonopin [Clonazepam] Hives  . Levofloxacin Cough    Choking Sensation  . Norvasc [Amlodipine Besylate] Swelling  . Pantoprazole Sodium Itching  . Ranitidine Itching  . Rofecoxib Other (See Comments)    Reaction unknown  . Shellfish Allergy Diarrhea  . Sulfa Antibiotics Other (See Comments)    Mouth Sores  . Sulfa Drugs Cross Reactors Other (See Comments)    Mouth Sores  . Codeine Anxiety    "Makes me crazy"  . Tape Rash and Other (See Comments)    SKIN IS VERY THIN; TEARS AND BRUISES VERY EASILY!!    Medications: I have reviewed the patient's current medications.  Dg Chest Portable 1 View  Result Date: 10/08/2016 CLINICAL DATA:  Shortness of Breath  EXAM: PORTABLE CHEST 1 VIEW COMPARISON:  07/02/2016 FINDINGS: Cardiac shadow is again enlarged in size. Aortic calcifications are seen. Lungs are well aerated bilaterally. Infiltrate is noted within the left mid and lower lung. No other focal infiltrate is seen. No bony abnormality is noted. IMPRESSION: Left-sided pneumonia Electronically Signed   By: Inez Catalina M.D.   On: 10/08/2016 12:52    ROS  As stated in the HPI and negative for all other systems.  Physical Exam  Vitals:Blood pressure 108/66, pulse (!) 59, temperature 97.4 F (36.3 C), temperature source Oral, resp. rate 18, height 5\' 3"  (1.6 m), weight 130 lb (59 kg), SpO2 95 %.  ill appearing elderly woman, NAD HEENT: Unremarkable Neck:  6 cm JVD, no  thyromegally Lymphatics:  No adenopathy Back:  No CVA tenderness Lungs:  Rales and rhonchi with egophony on the left side HEART:  IRegular rate rhythm, no murmurs, no rubs, no clicks Abd:  soft, positive bowel sounds, no organomegally, no rebound, no guarding Ext:  2 plus pulses, no edema, no cyanosis, no clubbing Skin:  No rashes no nodules Neuro:  CN II through XII intact, motor grossly intact  ECG - reviewed. Atrial fib with Nonspecific STT changes.  Labs reviewed  CXR - reviewed. Left sided infiltrates  Assessment/Plan: 1. Pneumonia - she will receive IV anti-biotics as per teaching service 2. Elevated troponin - this is a consequence of her underlying illness in the setting of atrial fib. No additional workup indicated. I would not order an echo unless she has persistent bacteremia. 3. Atrial fib - control her ventricular rate. No anti-coagulation as noted in the past by her primary cardiologist. 4. Disp. - please call for additional questions or issues.  Carleene Overlie TaylorMD 10/09/2016, 3:20 PM

## 2016-10-09 NOTE — Progress Notes (Signed)
Family Medicine Teaching Service Daily Progress Note Intern Pager: [phone]  Patient name: Tracy Richards record number: [id number] Date of birth: 1929-04-05 Age: 83 y.o. Gender: female  Primary Care Provider: Cyndi Bender, PA-C Consultants: Cards Code Status: DNR  Pt Overview and Major Events to Date:  1/6: Admitted for shortness of breath  Assessment and Plan: Tracy Richards is a 83 y.o. female presenting with productive cough and dyspnea. PMH is significant for COPD, atrial fibrillation, hypertension, hypothyroidism, hyperlipidemia, PVD  CAP: Noted on CXR, left sided. No significant increased work of breathing on nasal cannula. No sign of fluid overload or effusion. However, BNP 1000 making CHF a possibility. ACS unlikely without ischemic EKG changes.  - Continue supplemental O2 as needed  - Patient is currently on her home requirement 2L - Continue ceftriaxone and azithromycin; transition to PO in 24-48hrs - Follow up blood and urine culture obtained in ED - Monitor respiratory status  HFpEF: No sign of fluid overload. CXR without pulmonary edema. Last echo in 05/2007 with EF of 55-60%. However, patient of elevated BNP to 1000. Patient on torsemide 20-40 mg as needed at home. - IV Lasix 20 mg daily. We will titrate as needed - Hold other home blood pressure medications for soft blood pressure - Echocardiogram - Troponin 0.06>>0.04>>0.08 (likely 2/2 stress from PNA) - Repeat EKG >> pending - Consult to cards w/ Trop elevation  COPD: No formal diagnosis for this. Seen Dr. Christinia Gully in 03/2015 and started on trial of Advair. He was not convinced if she had COPD at that time -Hold home Advair -Start [facility] inpatient -Albuterol nebulizers every 4 hours as needed for shortness of breath -Monitor respiratory status -Oxygen as needed  Atrial fibrillation: Not in RVR. [place] Vas score 4 (8.5% risk). Has Bled 2 (4.1% risk of major bleeding). Rate controlled on diltiazem and  metoprolol at home. Not on anticoagulation except baby aspirin.  - Continue metoprolol. Hold diltiazem for low blood pressure - Continue aspirin for now. Need discussion about anticoagulation given risk for fall  Hypertension: Normotensive - Continue home metoprolol. Hold home diltiazem and losartan for low blood pressure  DM-2: A1c 11.5. Doesn't appear to be on any medication for this.  - SSI-thin - Continue on gabapentin  Hypothyroidism: On Synthroid 100 mcg daily at home - Continue on Synthroid - Repeat TSH >> wnl  Anxiety: On Xanax 0.5 mg twice a day as needed for anxiety -Continue Xanax at 0.25 mg twice a day when necessary  Chronic pain: On Percocet and oxycodone at home but denies taking -Tylenol when necessary  FEN/GI:  -Car modified diet -Protonix  Prophylaxis: Lovenox   Disposition: pending improvement  Subjective:  Feels slightly better than yesterday. Still some SOB and cough. No CP or fevers.  Objective: Temp:  [97.4 F (36.3 C)-103 F (39.4 C)] 97.4 F (36.3 C) (01/07 0923) Pulse Rate:  [59-107] 59 (01/07 0923) Resp:  [18-31] 18 (01/07 0923) BP: (97-153)/(48-86) 108/66 (01/07 0923) SpO2:  [94 %-100 %] 95 % (01/07 0923) Physical Exam: Gen: NAD, alert, cooperative, and pleasant. CV: RRR, no murmur Resp: Left-sided rhonchi/crackles noted throughout. Right side without wheezes, crackles, or rhonchi. Good air flow. Nonlabored. Abd: SNTND, BS present, no guarding or organomegaly Ext: No edema, warm Neuro: Alert and oriented, Speech clear (but hoarse), No gross deficits   Laboratory:  Recent Labs Lab 10/08/16 1236 10/09/16 0737  WBC 11.6* 11.9*  HGB 11.5* 10.0*  HCT 35.2* 30.7*  PLT 166 132*  Recent Labs Lab 10/08/16 1236 10/09/16 0737  NA 138 139  K 3.1* 2.9*  CL 103 105  CO2 24 27  BUN 9 9  CREATININE 0.73 0.77  CALCIUM 8.3* 8.4*  PROT 6.5  --   BILITOT 1.1  --   ALKPHOS 100  --   ALT 10*  --   AST 14*  --   GLUCOSE 148*  101*    Imaging/Diagnostic Tests: 1/6: CXR IMPRESSION: Left-sided pneumonia   Elberta Leatherwood, MD 10/09/2016, 2:08 PM PGY-3, [place] Intern pager: [phone], text pages welcome

## 2016-10-10 ENCOUNTER — Other Ambulatory Visit (HOSPITAL_COMMUNITY): Payer: Medicare Other

## 2016-10-10 ENCOUNTER — Inpatient Hospital Stay (HOSPITAL_COMMUNITY): Payer: Medicare Other

## 2016-10-10 DIAGNOSIS — R0600 Dyspnea, unspecified: Secondary | ICD-10-CM

## 2016-10-10 LAB — GLUCOSE, CAPILLARY
GLUCOSE-CAPILLARY: 163 mg/dL — AB (ref 65–99)
Glucose-Capillary: 98 mg/dL (ref 65–99)
Glucose-Capillary: 116 mg/dL — ABNORMAL HIGH (ref 65–99)
Glucose-Capillary: 125 mg/dL — ABNORMAL HIGH (ref 65–99)

## 2016-10-10 LAB — CBC WITH DIFFERENTIAL/PLATELET
Basophils Absolute: 0 10*3/uL (ref 0.0–0.1)
Basophils Relative: 0 %
Eosinophils Absolute: 0.3 10*3/uL (ref 0.0–0.7)
Eosinophils Relative: 3 %
HEMATOCRIT: 30.5 % — AB (ref 36.0–46.0)
HEMOGLOBIN: 10 g/dL — AB (ref 12.0–15.0)
LYMPHS ABS: 1.5 10*3/uL (ref 0.7–4.0)
LYMPHS PCT: 15 %
MCH: 31.3 pg (ref 26.0–34.0)
MCHC: 32.8 g/dL (ref 30.0–36.0)
MCV: 95.3 fL (ref 78.0–100.0)
MONOS PCT: 9 %
Monocytes Absolute: 0.9 10*3/uL (ref 0.1–1.0)
NEUTROS ABS: 7.2 10*3/uL (ref 1.7–7.7)
NEUTROS PCT: 73 %
Platelets: 154 10*3/uL (ref 150–400)
RBC: 3.2 MIL/uL — ABNORMAL LOW (ref 3.87–5.11)
RDW: 13.5 % (ref 11.5–15.5)
WBC: 9.8 10*3/uL (ref 4.0–10.5)

## 2016-10-10 LAB — TROPONIN I: Troponin I: 0.03 ng/mL (ref <0.03)

## 2016-10-10 LAB — ECHOCARDIOGRAM COMPLETE
HEIGHTINCHES: 63 in
Weight: 2134.05 oz

## 2016-10-10 LAB — BASIC METABOLIC PANEL
Anion gap: 5 (ref 5–15)
BUN: 11 mg/dL (ref 6–20)
CHLORIDE: 103 mmol/L (ref 101–111)
CO2: 26 mmol/L (ref 22–32)
CREATININE: 0.74 mg/dL (ref 0.44–1.00)
Calcium: 8.4 mg/dL — ABNORMAL LOW (ref 8.9–10.3)
GFR calc Af Amer: >60 mL/min (ref >60)
GFR calc non Af Amer: >60 mL/min (ref >60)
Glucose, Bld: 129 mg/dL — ABNORMAL HIGH (ref 65–99)
Potassium: 4.2 mmol/L (ref 3.5–5.1)
Sodium: 134 mmol/L — ABNORMAL LOW (ref 135–145)

## 2016-10-10 MED ORDER — GLUCERNA SHAKE PO LIQD
237.0000 mL | Freq: Two times a day (BID) | ORAL | Status: DC
  Administered 2016-10-11: 237 mL via ORAL

## 2016-10-10 MED ORDER — SODIUM CHLORIDE 0.9 % IV BOLUS (SEPSIS)
500.0000 mL | Freq: Once | INTRAVENOUS | Status: AC
  Administered 2016-10-10: 500 mL via INTRAVENOUS

## 2016-10-10 MED ORDER — AMOXICILLIN-POT CLAVULANATE 875-125 MG PO TABS
1.0000 | ORAL_TABLET | Freq: Two times a day (BID) | ORAL | Status: DC
  Administered 2016-10-10 – 2016-10-12 (×5): 1 via ORAL
  Filled 2016-10-10 (×5): qty 1

## 2016-10-10 NOTE — Progress Notes (Signed)
Initial Nutrition Assessment  DOCUMENTATION CODES:   Not applicable  INTERVENTION:  Discontinue Ensure.   Provide Glucerna Shake po BID, each supplement provides 220 kcal and 10 grams of protein  Encourage adequate PO intake.   NUTRITION DIAGNOSIS:   Increased nutrient needs related to chronic illness as evidenced by estimated needs.  GOAL:   Patient will meet greater than or equal to 90% of their needs  MONITOR:   PO intake, Supplement acceptance, Labs, Weight trends, Skin, I & O's  REASON FOR ASSESSMENT:   Malnutrition Screening Tool    ASSESSMENT:   83 y.o. female presenting with productive cough and dyspnea. PMH is significant for COPD, atrial fibrillation, hypertension, hypothyroidism, hyperlipidemia, PVD  Pt reports having a decreased appetite. Meal completion has been 75-80% with 80% at lunch today. Pt reports eating well PTA with usual consumption of at least 3 meals a day. Pt with no significant weight loss per records. Pt reports recently her daughter has brought her Ensure for home however pt reports disliking the taste as it was "too sweet". Noted pt currently has Ensure ordered. RD to modify orders to Glucerna Shake instead.   Nutrition-Focused physical exam completed. Findings are no fat depletion, moderate muscle depletion, and mild edema.   Labs and medications reviewed.   Diet Order:  Diet Heart Room service appropriate? Yes; Fluid consistency: Thin  Skin:  Reviewed, no issues  Last BM:  1/7  Height:   Ht Readings from Last 1 Encounters:  10/08/16 5\' 3"  (1.6 m)    Weight:   Wt Readings from Last 1 Encounters:  10/09/16 133 lb 6.1 oz (60.5 kg)    Ideal Body Weight:  52.27 kg  BMI:  Body mass index is 23.63 kg/m.  Estimated Nutritional Needs:   Kcal:  1600-1800  Protein:  65-80 grams  Fluid:  1.6 - 1.8 L/day  EDUCATION NEEDS:   No education needs identified at this time  Corrin Parker, MS, RD, LDN Pager # [phone] After  hours/ weekend pager # [phone]

## 2016-10-10 NOTE — Progress Notes (Signed)
  Echocardiogram 2D Echocardiogram has been performed.  Diamond Nickel 10/10/2016, 3:54 PM

## 2016-10-10 NOTE — Progress Notes (Signed)
At 0537, BP was 85/59 manually.  She is resting peacefully and awakens easily.  MD made aware.  Received order for 500 NS Bolus.  This was started at 0655.  Will continue to monitor patient.  [employer] RN-BC, WTA.

## 2016-10-10 NOTE — Progress Notes (Signed)
Family Medicine Teaching Service Daily Progress Note Intern Pager: [phone]  Patient name: Tracy Richards: [id number] Date of birth: [birth year] Age: 83 y.o. Gender: female  Primary Care Provider: Cyndi Bender, PA-C Consultants: Cardiology Code Status: DNR  Pt Overview and Major Events to Date:  None  Assessment and Plan: Tracy Richards a 83 y.o.femalepresenting with productive cough and dyspnea. PMH is significant for COPD, atrial fibrillation, hypertension, hypothyroidism, hyperlipidemia, PVD.  Dyspnea and Cough likely 2/2 CAP: improved. Some crackles over LLL fields consistent with CXR. Can not rule out CHF with elevated BNP to 1000 although no sign of fluid overload. Spoke with the daughter this afternoon and they would like to pursue SNF placement. -Continue home O2 via nasal canula. -Azithro and CTX 1/6-1/8 - Augmentin 875-125 mg twice a day 1/8>1/12 - Monitor respiratory status.  - IS - PT - SW for SNF  HFpEF: Crackles heard but likely due to PNA. No other signs of fluid overload but elevated BNP to 1000. Her last echo was in 05/2007 with EF of 55-60%. ACS ruled out by serial trop and EKG. Patient on torsemide 20-40 mg as needed at home. - Stopping Lasix 20 mg IV QD. - restart home torsemide at 20 mg if BP stable - Holding home blood pressure medications. - Will get echocardiogram.  COPD: stable -Hold home Advair -[facility] inpatient -Albuterol nebulizers every 4 hours as needed for shortness of breath -Monitor respiratory status - oxygen PRN. On 2L at home  Atrial fibrillation: [place] Vas score 4 (8.5% risk). Has Bled 2 (4.1% risk of major bleeding). Rate controlled on diltiazem and metoprolol at home. On baby aspirin due to risk of fall. Card agrees.  - Continue metoprolol. Hold diltiazem for low blood pressure - Continue aspirin for now.   Hypertension: Hypotensive this morning while asleep, but showed adequate response to bolus of 500 mL NS.  Now 105/83. - Continue home metoprolol. Hold home diltiazem and losartan for low blood pressure. - Stopped Lasix.   DM-2: A1c 11.5. Doesn't appear to be on any medication for this.  - SSI-thin. - Continue on home gabapentin.  Hypothyroidism: On Synthroid 100 mcg daily at home. - Continue on Synthroid.  Anxiety:On Xanax 0.5 mg twice a day as needed for anxiety. -Continue Xanax at 0.25 mg twice a day when necessary.  Chronic pain: On Percocet and oxycodone at home but denies taking -Tylenol when necessary.  FEN/GI:  -Carb modified diet. -Protonix  Prophylaxis: Lovenox  Disposition: Pending.   Subjective:  This morning she had some complaints of left sided chest pain that has been present since admission. No radiation of her chest pain. Worse with deep inspiration and coughing. No worsened shortness of breath.   Objective: Temp:  [98.3 F (36.8 C)-99.8 F (37.7 C)] 98.3 F (36.8 C) (01/08 0514) Pulse Rate:  [65-96] 96 (01/08 0514) Resp:  [18] 18 (01/08 0514) BP: (85-121)/(59-83) 85/59 (01/08 0537) SpO2:  [95 %-98 %] 98 % (01/08 0514) Weight:  [60.5 kg (133 lb 6.1 oz)] 60.5 kg (133 lb 6.1 oz) (01/07 2119) Physical Exam: GEN: frail lady, lying in bed with [place] in place, appears comfortable Oropharynx: mmm CVS: irregularly irregular, normal rate on monitior, nl s1 & s2, no murmurs, no edema RESP: [place] in place, no increased work of breathing, good air movement bilaterally, no rhonchi, bibasilar crackles left > right GI: Bowel sounds present and normal, soft, non-tender MSK: tenderness to palpation over her left chest SKIN: no apparent skin lesion  NEURO: alert and oiented appropriately, no gross defecits  PSYCH: euthymic mood with congruent affect Laboratory:  Recent Labs Lab 10/08/16 1236 10/09/16 0737  WBC 11.6* 11.9*  HGB 11.5* 10.0*  HCT 35.2* 30.7*  PLT 166 132*    Recent Labs Lab 10/08/16 1236 10/09/16 0737  NA 138 139  K 3.1* 2.9*  CL 103 105   CO2 24 27  BUN 9 9  CREATININE 0.73 0.77  CALCIUM 8.3* 8.4*  PROT 6.5  --   BILITOT 1.1  --   ALKPHOS 100  --   ALT 10*  --   AST 14*  --   GLUCOSE 148* 101*   Imaging/Diagnostic Tests: Echo pending.   Jacklynn Ganong, MS4 10/10/2016, 9:58 AM [place] Intern pager: [phone], text pages welcome

## 2016-10-10 NOTE — Discharge Summary (Signed)
[facility] Discharge Summary  Patient name: Tracy Richards record number: [id number] Date of birth: 05-07-1929 Age: 83 y.o. Gender: female Date of Admission: 10/08/2016  Date of Discharge: 10/12/16 Admitting Physician: Zenia Resides, MD  Primary Care Provider: Cyndi Bender, PA-C Consultants: Cardiology  Indication for Hospitalization: Dyspnea and Cough  Discharge Diagnoses/Problem List:  - Community Acquired Pneumonia - COPD - Diastolic Heart Failure - Pulmonary Hypertension - Hypertension - Atrial Fibrillation - Hypothyroidism - Hyperlipidemia - Diabetes - Peripheral Vascular Disease  Disposition: To SNF  Discharge Condition: Stable  Discharge Exam:  GEN: frail lady, lying in bed with [place] in place, appears comfortable Oropharynx: mmm CVS: irregularly irregular, normal rate on monitior, nl s1 & s2, no murmurs, no edema RESP: [place] in place, no increased work of breathing, good air movement bilaterally, no rhonchi, mild bibasilar crackles GI: Bowel sounds present and normal, soft, non-tender MSK: tenderness to palpation over her left chest SKIN: no apparent skin lesion NEURO: alert and oiented appropriately, no gross defecits  PSYCH: euthymic mood with congruent affect  Brief Hospital Course:  Tracy Richards is an 83 y/o female with past medical history significant for COPD on (2L oxygen by [place]) , a-fib, hypertension, hypothyroidism, hyperlipidemia, peripheral vascular disease and diabetes who presented with dyspnea and cough due to community-acquired pneumonia.   Pneumonia: on arrival to ED, patient had increased oxygen requirement, increased work of breathing and fever to 103. CXR showed a left lobe consolidation more consistent with CAP than with CHF exacerbation. She was briefly started on BiPAP. She was also started on IV ceftriaxone and azithromycin on 10/08/2016 with subsequent improvement in her symptoms. She was transitioned to nasal  cannula while in ED and admitted to telemetry floor.   On telemetry floor, patient received IV ceftriaxone and azithromycin from 10/08/2016 to 10/10/2016. She remained afebrile. Mild leukocytosis resolved. She was transitioned to oral Augmentin. She tolerated Augmentin well and remained afebrile and stable. Physical therapy was consulted for disposition recommended skilled nursing facility. This was complicated with patient's daughter by the name Horris Latino, who agreed with the plan.  Patient is discharged on Augmentin to take through 10/15/2016.   HFpEF: patient had BNP elevated to 1000 although CXR showed a left lobe consolidation more consistent with CAP than with CHF exacerbation. ACS was ruled out by serial troponin and EKG. Echocardiogram was done and showed EF of 55-60%, severely dilated left and right atrium and pulmonary arterial pressure of 74 mmHg. Patient was diuresed with IV Lasix 20 mg daily for three days with significant improvement in her clinical symptoms. She was discharged on a home torsemide. We'll recommend outpatient follow-up with cardiology for her heart failure and Echo finding.   Pulmonary Hypertension: noted on Echo with severely dilated right atrium and pulmonary arterial pressure of 74 mmHg. Recommend outpatient follow up with [id number]   Atrial fibrillation: [place] Vas score 4 (8.5% risk). Has Bled 2 (4.1% risk of major bleeding). Rate controlled on diltiazem 180 mg CD daily and metoprolol 25 mg twice a day at home. Continued on 12.5 mg of metoprolol twice a day. On baby aspirin for anticoagulation due to risk of fall. Cardiology agrees with this. She was transitioned back to her diltiazem 180 mg CD prior to discharge. Metoprolol was held given history of COPD at discharge. This can be resumed if needed for rate control.   Hypertension: Mostly normotensive on metoprolol 12.5 mg twice a day during this hospitalization.  Losartan was held the  entire hospitalization. Metoprolol  was held at discharge, and  diltiazem 180 mg CD daily was started prior to discharge given history of COPD.  Her other chronic medical conditions are stable.    Issues for Follow Up:  1. HFpEF: Echocardiogram was done and showed EF of 55-60%, severely dilated left and right atrium and pulmonary arterial pressure of 74 mmHg. need outpatient cardiology follow-up for this. Recommend repeat BMP in a week. 2. Pneumonia: Discharged on Augmentin to take through 10/15/2016. 3. Atrial fibrillation: as above 4. Hypertension: can adjust blood pressure medications as needed.   Significant Procedures: Echocardiogram  Significant Labs and Imaging:   Recent Labs Lab 10/09/16 0737 10/10/16 1023 10/11/16 1136  WBC 11.9* 9.8 8.0  HGB 10.0* 10.0* 10.5*  HCT 30.7* 30.5* 32.5*  PLT 132* 154 171    Recent Labs Lab 10/08/16 1236 10/09/16 0737 10/10/16 1023 10/11/16 1136  NA 138 139 134* 134*  K 3.1* 2.9* 4.2 4.4  CL 103 105 103 103  CO2 24 27 26 24   GLUCOSE 148* 101* 129* 135*  BUN 9 9 11 11   CREATININE 0.73 0.77 0.74 0.70  CALCIUM 8.3* 8.4* 8.4* 8.7*  ALKPHOS 100  --   --   --   AST 14*  --   --   --   ALT 10*  --   --   --   ALBUMIN 3.4*  --   --   --    Study Conclusions  - Left ventricle: The cavity size was normal. Wall thickness was   normal. Systolic function was normal. The estimated ejection   fraction was in the range of 55% to 60%. The study is not   technically sufficient to allow evaluation of LV diastolic   function. - Aortic valve: There was trivial regurgitation. - Mitral valve: There was mild regurgitation. - Left atrium: The atrium was severely dilated. - Right ventricle: The cavity size was mildly dilated. - Right atrium: The atrium was severely dilated. - Tricuspid valve: There was moderate regurgitation. - Pulmonary arteries: PA peak pressure: 74 mm Hg (S).  Results/Tests Pending at Time of Discharge: None.  Discharge Medications:  Allergies as of  10/12/2016      Reactions   Amlodipine Swelling   Avelox [moxifloxacin Hcl In Nacl] Other (See Comments)   G.I. Upset   Carafate [sucralfate] Itching   Ulcers also   Celecoxib Other (See Comments)   Reaction unknown   Diazepam Hives   Doxazosin Other (See Comments)   Reaction unknown   Klonopin [clonazepam] Hives   Levofloxacin Cough   Choking Sensation   Norvasc [amlodipine Besylate] Swelling   Pantoprazole Sodium Itching   Ranitidine Itching   Rofecoxib Other (See Comments)   Reaction unknown   Shellfish Allergy Diarrhea   Sulfa Antibiotics Other (See Comments)   Mouth Sores   Sulfa Drugs Cross Reactors Other (See Comments)   Mouth Sores   Codeine Anxiety   "Makes me crazy"   Tape Rash, Other (See Comments)   SKIN IS VERY THIN; TEARS AND BRUISES VERY EASILY!!      Medication List    STOP taking these medications   losartan 100 MG tablet Commonly known as:  COZAAR   meclizine 25 MG tablet Commonly known as:  ANTIVERT   metoprolol tartrate 25 MG tablet Commonly known as:  LOPRESSOR     TAKE these medications   acetaminophen 500 MG tablet Commonly known as:  TYLENOL Take 2 tablets (1,000 mg total) by  mouth 3 (three) times daily. What changed:  how much to take  when to take this  reasons to take this   albuterol (2.5 MG/3ML) 0.083% nebulizer solution Commonly known as:  PROVENTIL Inhale 3 mLs into the lungs every 6 (six) hours as needed for wheezing or shortness of breath.   ALPRAZolam 0.5 MG tablet Commonly known as:  XANAX Take 0.5 mg by mouth 2 (two) times daily as needed for anxiety.   amoxicillin-clavulanate 875-125 MG tablet Commonly known as:  AUGMENTIN Take 1 tablet by mouth 2 (two) times daily.   aspirin EC 81 MG tablet Take 81 mg by mouth daily.   atorvastatin 40 MG tablet Commonly known as:  LIPITOR Take 40 mg by mouth at bedtime.   diltiazem 180 MG 24 hr capsule Commonly known as:  DILACOR XR Take 180 mg by mouth daily.    feeding supplement (GLUCERNA SHAKE) Liqd Take 237 mLs by mouth 2 (two) times daily between meals.   Fluticasone-Salmeterol 250-50 MCG/DOSE Aepb Commonly known as:  ADVAIR DISKUS Inhale 1 puff into the lungs 2 (two) times daily.   gabapentin 600 MG tablet Commonly known as:  NEURONTIN Take 1 tablet (600 mg total) by mouth 2 (two) times daily. What changed:  when to take this  additional instructions   levothyroxine 100 MCG tablet Commonly known as:  SYNTHROID, LEVOTHROID Take 100 mcg by mouth daily.   nitroGLYCERIN 0.4 MG SL tablet Commonly known as:  NITROSTAT Place 0.4 mg under the tongue every 5 (five) minutes as needed.   pantoprazole 40 MG tablet Commonly known as:  PROTONIX Take 40 mg by mouth daily.   polyethylene glycol packet Commonly known as:  MIRALAX / GLYCOLAX Take 17 g by mouth daily as needed for moderate constipation.   potassium chloride 10 MEQ tablet Commonly known as:  K-DUR,KLOR-CON Take 10 mEq by mouth daily.   senna 8.6 MG tablet Commonly known as:  SENOKOT Take 1 tablet (8.6 mg total) by mouth daily.   torsemide 20 MG tablet Commonly known as:  DEMADEX Take 20-40 mg by mouth daily as needed for fluid.       Discharge Instructions: Please refer to Patient Instructions section of EMR for full details.  Patient was counseled important signs and symptoms that should prompt return to medical care, changes in medications, dietary instructions, activity restrictions, and follow up appointments.   Follow-Up Appointments: Contact information for after-discharge care    [facility].   Specialty:  [place] Why:  Office will contact you for a follow up appointment. Thank you.  Contact information: [address]              Wendee Beavers, MD.  PGY-2, [facility] Pager [phone] 10/12/16  12:12 PM

## 2016-10-11 LAB — CBC
HCT: 32.5 % — ABNORMAL LOW (ref 36.0–46.0)
HEMOGLOBIN: 10.5 g/dL — AB (ref 12.0–15.0)
MCH: 30.5 pg (ref 26.0–34.0)
MCHC: 32.3 g/dL (ref 30.0–36.0)
MCV: 94.5 fL (ref 78.0–100.0)
Platelets: 171 10*3/uL (ref 150–400)
RBC: 3.44 MIL/uL — AB (ref 3.87–5.11)
RDW: 13.8 % (ref 11.5–15.5)
WBC: 8 10*3/uL (ref 4.0–10.5)

## 2016-10-11 LAB — BASIC METABOLIC PANEL
Anion gap: 7 (ref 5–15)
BUN: 11 mg/dL (ref 6–20)
CHLORIDE: 103 mmol/L (ref 101–111)
CO2: 24 mmol/L (ref 22–32)
Calcium: 8.7 mg/dL — ABNORMAL LOW (ref 8.9–10.3)
Creatinine, Ser: 0.7 mg/dL (ref 0.44–1.00)
GFR calc Af Amer: >60 mL/min (ref >60)
GFR calc non Af Amer: >60 mL/min (ref >60)
GLUCOSE: 135 mg/dL — AB (ref 65–99)
POTASSIUM: 4.4 mmol/L (ref 3.5–5.1)
Sodium: 134 mmol/L — ABNORMAL LOW (ref 135–145)

## 2016-10-11 LAB — GLUCOSE, CAPILLARY
GLUCOSE-CAPILLARY: 117 mg/dL — AB (ref 65–99)
Glucose-Capillary: 117 mg/dL — ABNORMAL HIGH (ref 65–99)
Glucose-Capillary: 133 mg/dL — ABNORMAL HIGH (ref 65–99)
Glucose-Capillary: 128 mg/dL — ABNORMAL HIGH (ref 65–99)

## 2016-10-11 NOTE — Evaluation (Signed)
Occupational Therapy Evaluation Patient Details Name: Tracy HAGGLUND MRN: [id number] DOB: 26-Dec-1928 Today's Date: 10/11/2016    History of Present Illness 83 y.o. female presenting with productive cough and dyspnea. PMH is significant for COPD, atrial fibrillation, hypertension, hypothyroidism, hyperlipidemia, PVD. Presented to ED with CAP on 10/08/16   Clinical Impression   PT admitted with PNA. Pt currently with functional limitiations due to the deficits listed below (see OT problem list). PTA was independent with all adls and living alone. Pt reports having home PT prior to admission.  Pt will benefit from skilled OT to increase their independence and safety with adls and balance to allow discharge SNF. Pt demonstrates 3 out 4 DOE with 2 L oxygen during session.      Follow Up Recommendations  SNF    Equipment Recommendations  3 in 1 bedside commode    Recommendations for Other Services       Precautions / Restrictions Precautions Precautions: Fall Precaution Comments: wears 2 L o2 at baseline Restrictions Weight Bearing Restrictions: No      Mobility Bed Mobility               General bed mobility comments: in chair on arrival  Transfers Overall transfer level: Needs assistance Equipment used: Rolling walker (2 wheeled) Transfers: Sit to/from [facility] Sit to Stand: Min guard         General transfer comment: pt relies heavily on RW once in standing    Balance Overall balance assessment: Needs assistance Sitting-balance support: Feet supported;Single extremity supported Sitting balance-Leahy Scale: Fair Sitting balance - Comments: bil Ue used to help with advancing anteriorly   Standing balance support: Bilateral upper extremity supported;During functional activity Standing balance-Leahy Scale: Poor Standing balance comment: Reliant on BUE for standing balance                            ADL Overall ADL's : Needs  assistance/impaired Eating/Feeding: Set up;Sitting   Grooming: Wash/dry hands;Wash/dry face;Min guard;Standing Grooming Details (indicate cue type and reason): fatigues and requires UE support on sink surface         Upper Body Dressing : Minimal assistance   Lower Body Dressing: Moderate assistance   Toilet Transfer: Moderate assistance   Toileting- Clothing Manipulation and Hygiene: Moderate assistance Toileting - Clothing Manipulation Details (indicate cue type and reason): requires steady (A) for static standing to complete peri hygiene     Functional mobility during ADLs: Minimal assistance General ADL Comments: Pt with decr O2 with mobility and 3 out 4 DOE. pt requires incr rest break to rebound     Vision     Perception     Praxis      Pertinent Vitals/Pain Pain Assessment: No/denies pain     Hand Dominance Right   Extremity/Trunk Assessment Upper Extremity Assessment Upper Extremity Assessment: Generalized weakness   Lower Extremity Assessment Lower Extremity Assessment: Defer to PT evaluation   Cervical / Trunk Assessment Cervical / Trunk Assessment: Kyphotic   Communication Communication Communication: HOH (wears hearing aides)   Cognition Arousal/Alertness: Awake/alert Behavior During Therapy: WFL for tasks assessed/performed Overall Cognitive Status: Within Functional Limits for tasks assessed                     General Comments       Exercises       Shoulder Instructions      Home Living Family/patient expects to be discharged  to:: Private residence Living Arrangements: Alone Available Help at Discharge: Friend(s);Family;Available PRN/intermittently Type of Home: Apartment ([facility] home; may go home with daugther after d/c) Home Access: Level entry     [place]: One level     Bathroom Shower/Tub: Teacher, early years/pre: [place]: Environmental consultant - 2 wheels;Cane - single point;Tub  bench;Toilet riser;Wheelchair - manual          Prior Functioning/Environment Level of Independence: Independent with assistive device(s)        Comments: daughter helped pt bathed as she was fearful pt would fall        OT Problem List: Decreased strength;Decreased activity tolerance;Impaired balance (sitting and/or standing);Decreased safety awareness;Decreased knowledge of use of DME or AE;Decreased knowledge of precautions;Cardiopulmonary status limiting activity   OT Treatment/Interventions: Self-care/ADL training;Therapeutic exercise;Neuromuscular education;Energy conservation;DME and/or AE instruction;Therapeutic activities;Patient/family education;Balance training    OT Goals(Current goals can be found in the care plan section) Acute Rehab OT Goals Patient Stated Goal: to get rehab - I was getting it at home before and nearly finished OT Goal Formulation: With patient Time For Goal Achievement: 10/25/16 Potential to Achieve Goals: Good  OT Frequency: Min 2X/week   Barriers to D/C: Decreased caregiver support          Co-evaluation              End of Session Equipment Utilized During Treatment: Gait belt;Rolling walker;Oxygen Nurse Communication: Mobility status;Precautions  Activity Tolerance: Patient tolerated treatment well Patient left: in chair;with call bell/phone within reach (RN reports "she is okay without one" when asked)   Time: 1140-1200 OT Time Calculation (min): 20 min Charges:  OT Evaluation $OT Eval Moderate Complexity: 1 Procedure G-Codes:    Parke Poisson B 2016/10/28, 2:48 PM   Jeri Modena   OTR/L Pager: [phone] Office: [phone] .

## 2016-10-11 NOTE — Progress Notes (Signed)
Physical Therapy Evaluation Patient Details Name: Tracy Richards MRN: [id number] DOB: Feb 17, 1929 Today's Date: 10/11/2016   History of Present Illness  83 y.o. female presenting with productive cough and dyspnea. PMH is significant for COPD, atrial fibrillation, hypertension, hypothyroidism, hyperlipidemia, PVD. Presented to ED with CAP on 10/08/16  Clinical Impression  PTA, pt was independent with dressing and cooking and ambulated with RW about her home. Pt daughter is primary caregiver and checks in on her mother intermittently to assist with bathing. Pt plans to move in with her daughter long term after d/c from hospital but no official plans have been made as of yet. Pt unsteady with stand pivot transfer from bed to chair this AM. Pt c/o lightheadedness upon sitting EOB but vitals remained stable throughout. Pt would be a good candidate for SNF to address decreased mobility and activity tolerance before return to home. MD in session also recommended SNF. PT will continue to follow acutely.    Follow Up Recommendations SNF    Equipment Recommendations  None recommended by PT    Recommendations for Other Services       Precautions / Restrictions Precautions Precautions: Fall Restrictions Weight Bearing Restrictions: No      Mobility  Bed Mobility Overal bed mobility: Modified Independent             General bed mobility comments: Requires extra time. No physical assist required  Transfers Overall transfer level: Needs assistance Equipment used: Rolling walker (2 wheeled) Transfers: Sit to/from [facility] Sit to Stand: Min assist Stand pivot transfers: Min assist       General transfer comment: Pt unsteady upon standing. Requires Min A to power to stand and maintain balance.  Ambulation/Gait                Stairs            Wheelchair Mobility    Modified Rankin (Stroke Patients Only)       Balance Overall balance assessment: Needs  assistance Sitting-balance support: Feet supported;Single extremity supported Sitting balance-Leahy Scale: Fair Sitting balance - Comments: Able to scoot without physical assist to EOB.    Standing balance support: Bilateral upper extremity supported;During functional activity Standing balance-Leahy Scale: Poor Standing balance comment: Reliant on BUE for standing balance                             Pertinent Vitals/Pain Pain Assessment: 0-10 Pain Score: 2  Pain Location: L chest Pain Descriptors / Indicators: Constant;Discomfort Pain Intervention(s): Limited activity within patient's tolerance;Monitored during session;Repositioned  Sitting EOB: BP 140/82  Sitting in chair: BP 151/67 SpO2 95-99% throughout on 2L O2  Home Living Family/patient expects to be discharged to:: Private residence Living Arrangements: Alone Available Help at Discharge: Friend(s);Family;Available PRN/intermittently Type of Home: Apartment ([facility] home; may go home with daugther after d/c) Home Access: Level entry     [place]: One level Home Equipment: Environmental consultant - 2 wheels;Cane - single point;Tub bench;Toilet riser;Wheelchair - manual      Prior Function Level of Independence: Independent with assistive device(s)         Comments: daughter helped pt bathed as she was fearful pt would fall     Hand Dominance        Extremity/Trunk Assessment   Upper Extremity Assessment Upper Extremity Assessment: Defer to OT evaluation    Lower Extremity Assessment Lower Extremity Assessment: Generalized weakness  Communication   Communication: HOH  Cognition Arousal/Alertness: Awake/alert Behavior During Therapy: WFL for tasks assessed/performed Overall Cognitive Status: Within Functional Limits for tasks assessed                      General Comments      Exercises     Assessment/Plan    PT Assessment Patient needs continued PT services  PT Problem List  Decreased strength;Decreased range of motion;Decreased activity tolerance;Decreased balance;Decreased mobility;Decreased knowledge of use of DME;Decreased safety awareness;Decreased knowledge of precautions;Pain          PT Treatment Interventions DME instruction;Gait training;Stair training;Functional mobility training;Therapeutic activities;Therapeutic exercise;Balance training;Patient/family education    PT Goals (Current goals can be found in the Care Plan section)  Acute Rehab PT Goals Patient Stated Goal: to get stronger PT Goal Formulation: With patient Time For Goal Achievement: 10/25/16 Potential to Achieve Goals: Fair    Frequency Min 3X/week   Barriers to discharge        Co-evaluation               End of Session Equipment Utilized During Treatment: Gait belt Activity Tolerance: Patient tolerated treatment well Patient left: in chair;with call bell/phone within reach;Other (comment) (with MD) Nurse Communication: Mobility status         [id number] PT Time Calculation (min) (ACUTE ONLY): 34 min   Charges:   PT Evaluation $PT Eval Moderate Complexity: 1 Procedure PT Treatments $Therapeutic Activity: 8-22 mins   PT G Codes:        Tonia Brooms 2016-10-28, 9:49 AM Tonia Brooms, SPT [phone]

## 2016-10-11 NOTE — Clinical Social Work Placement (Addendum)
   CLINICAL SOCIAL WORK PLACEMENT  NOTE 10/12/16 - DISCHARGED TO [facility]  Date:  10/11/2016  Patient Details  Name: Tracy Richards MRN: [id number] Date of Birth: 08-10-1929  Clinical Social Work is seeking post-discharge placement for this patient at the [place] level of care (*CSW will initial, date and re-position this form in  chart as items are completed):  No   Patient/family provided with [place] Work Department's list of facilities offering this level of care within the geographic area requested by the patient (or if unable, by the patient's family).  Yes   Patient/family informed of their freedom to choose among providers that offer the needed level of care, that participate in Medicare, Medicaid or managed care program needed by the patient, have an available bed and are willing to accept the patient.  No   Patient/family informed of [place]'s ownership interest in [facility] and [facility], as well as of the fact that they are under no obligation to receive care at these facilities.  PASRR submitted to EDS on       PASRR number received on       Existing PASRR number confirmed on 10/11/16     FL2 transmitted to all facilities in geographic area requested by pt/family on 10/11/16     FL2 transmitted to all facilities within larger geographic area on       Patient informed that his/her managed care company has contracts with or will negotiate with certain facilities, including the following:        Yes   Patient/family informed of bed offers received.  Patient chooses bed at [facility]     Physician recommends and patient chooses bed at      Patient to be transferred to [facility] on 10/12/16.  Patient to be transferred to facility by       Patient family notified on 10/11/16 of transfer on 10/12/16.  Name of family member notified:  Corinda Gubler - daughter by phone  [phone]) and message left to advise her that patient had been picked up by ambulance.  PHYSICIAN Please prepare priority discharge summary, including medications     Additional Comment:    _______________________________________________ Sable Feil, LCSW 10/11/2016, 4:15 PM

## 2016-10-11 NOTE — Progress Notes (Signed)
Family Medicine Teaching Service Daily Progress Note Intern Pager: [phone]  Patient name: Tracy Richards record number: [id number] Date of birth: 04-11-1929 Age: 83 y.o. Gender: female  Primary Care Provider: Cyndi Bender, PA-C Consultants: Cardiology Code Status: DNR  Pt Overview and Major Events to Date:  None  Assessment and Plan: Tracy Richards Ashleyis a 83 y.o.femalepresenting with productive cough and dyspnea. PMH is significant for COPD, atrial fibrillation, hypertension, hypothyroidism, hyperlipidemia, PVD.  Dyspnea and Cough likely 2/2 CAP: This continues to improve symptomatically, she remains afebrile and her WBC continues to downtrend. She has bibasilar crackles which disallows [place] from ruling out CHF with elevated BNP to 1000 although no sign of fluid overload. Spoke with the daughter this afternoon and they would like to pursue SNF placement. -Continue home O2 2L via nasal canula. - Azithro and CTX 1/6-1/8 - Augmentin 875-125 mg twice a day 1/8>1/12 - Monitor respiratory status.  - IS - SW for SNF  HFpEF: Crackles heard but likely due to PNA. No other signs of fluid overload but elevated BNP to 1000. Patient on torsemide 20-40 mg as needed at home.  - Echo 10/10/16 relatively unchanged from previous (in 2008); EF 55-60% - Holding home losartan and torsemide 2/2 blood pressure.  COPD: Stable; On 2L at home - Hold home [facility] inpatient - Albuterol nebulizers every 4 hours as needed for shortness of breath - Monitor respiratory status - oxygen PRN.   Atrial fibrillation: [place] Vas score 4 (8.5% risk). Has Bled 2 (4.1% risk of major bleeding). Rate controlled on diltiazem and metoprolol at home. On baby aspirin due to risk of fall. Cardiology agrees.  - Continue metoprolol. Hold diltiazem for low blood pressure - Continue aspirin for now.   Hypertension: Normotensive. - Continue home metoprolol. Hold home diltiazem and losartan for low blood  pressure.  DM-2: A1c 11.5. Doesn't appear to be on any medication for this.  - SSI-thin. - Continue on home gabapentin. - DC summary to include recs for OP therapy for DM2  Hypothyroidism: On Synthroid 100 mcg daily at home. - Continue on Synthroid.  Anxiety:On home Xanax 0.5 mg twice a day as needed for anxiety. -Continue Xanax at 0.25 mg twice a day when necessary.  Chronic pain: On Percocet and oxycodone at home but denies taking -Tylenol when necessary.  FEN/GI:  -Carb modified diet. -Protonix  Prophylaxis: Lovenox  Disposition: Ready for discharge pending SNF placement.   Subjective:  She states that she feels better today than she did yesterday. Her shortness of breath is improved. The cough is still present and it is still productive of some yellow sputum.   Objective: Temp:  [98 F (36.7 C)-99.1 F (37.3 C)] 98 F (36.7 C) (01/09 0900) Pulse Rate:  [85-100] 85 (01/09 0900) Resp:  [18-20] 18 (01/09 0900) BP: (109-151)/(56-82) 151/67 (01/09 0944) SpO2:  [94 %-98 %] 94 % (01/09 0911) Weight:  [60.2 kg (132 lb 12.8 oz)] 60.2 kg (132 lb 12.8 oz) (01/09 0500) Physical Exam: General: Appears better than she did yesterday. Appears less sick and is sitting up in the chair waiting to eat breakfast. Cardiovascular: Regular rate and rhythm with no appreciable murmurs. No JVD. Respiratory: Lungs have crackles at the bases bilaterally. No wheezes.  Abdomen: Soft, non-tender and non-distended.  Extremities: No swelling of the extremities.   Laboratory:  Recent Labs Lab 10/09/16 0737 10/10/16 1023 10/11/16 1136  WBC 11.9* 9.8 8.0  HGB 10.0* 10.0* 10.5*  HCT 30.7* 30.5* 32.5*  PLT 132* 154 171    Recent Labs Lab 10/08/16 1236 10/09/16 0737 10/10/16 1023 10/11/16 1136  NA 138 139 134* 134*  K 3.1* 2.9* 4.2 4.4  CL 103 105 103 103  CO2 24 27 26 24   BUN 9 9 11 11   CREATININE 0.73 0.77 0.74 0.70  CALCIUM 8.3* 8.4* 8.4* 8.7*  PROT 6.5  --   --   --    BILITOT 1.1  --   --   --   ALKPHOS 100  --   --   --   ALT 10*  --   --   --   AST 14*  --   --   --   GLUCOSE 148* 101* 129* 135*    Imaging/Diagnostic Tests: Echo: Study Conclusions  - Left ventricle: The cavity size was normal. Wall thickness was   normal. Systolic function was normal. The estimated ejection   fraction was in the range of 55% to 60%. The study is not   technically sufficient to allow evaluation of LV diastolic   function. - Aortic valve: There was trivial regurgitation. - Mitral valve: There was mild regurgitation. - Left atrium: The atrium was severely dilated. - Right ventricle: The cavity size was mildly dilated. - Right atrium: The atrium was severely dilated. - Tricuspid valve: There was moderate regurgitation. - Pulmonary arteries: PA peak pressure: 74 mm Hg (S).  Jacklynn Ganong, MS4 10/11/2016, 1:26 PM [place] Intern pager: [phone], text pages welcome

## 2016-10-11 NOTE — NC FL2 (Signed)
[place] LEVEL OF CARE SCREENING TOOL     IDENTIFICATION  Patient Name: Tracy Richards Birthdate: [birth year] Sex: female Admission Date (Current Location): 10/08/2016  [facility] and [place] Number:  Herbalist and Address:  [facility]. [facility], [place] [address] [place], [place] [address]      Provider Number: [id number]  Attending Physician Name and Address:  Zenia Resides, MD  Relative Name and Phone Number:  France Ravens - daughter - [phone]    Current Level of Care: Hospital Recommended Level of Care: Tracy Prior Approval Number:    Date Approved/Denied:   PASRR Number: [id number] (Eff. 04/14/09)  Discharge Plan: SNF    Current Diagnoses: Patient Active Problem List   Diagnosis Date Noted  . Acute respiratory failure with hypoxia ([place])   . Community acquired pneumonia of left upper lobe of lung ([place])   . CAP (community acquired pneumonia) 10/08/2016  . Shortness of breath 10/08/2016  . Sacral fracture, closed ([place]) 07/24/2016  . Chronic respiratory failure with hypoxia ([place]) 03/13/2015  . COPD bronchitis 10/20/2014  . Other specified hypothyroidism 04/01/2013  . HBP (high blood pressure) 03/19/2013    Orientation RESPIRATION BLADDER Height & Weight     Self, Time, Situation, Place  O2 (2 Liters oxygen) Continent Weight: 132 lb 12.8 oz (60.2 kg) Height:  5\' 3"  (160 cm)  BEHAVIORAL SYMPTOMS/MOOD NEUROLOGICAL BOWEL NUTRITION STATUS      Continent    AMBULATORY STATUS COMMUNICATION OF NEEDS Skin   Extensive Assist (Pt unsteady upon standing. Requires Min A to power to stand and maintain balance. Patient did not ambulate with PT on1/9 eval) Verbally Other (Comment) (Ecchymosis-left/right arm and MASD to left/right sacrum)                       Personal Care Assistance Level of Assistance  Bathing, Feeding, Dressing Bathing Assistance: Limited assistance Feeding assistance: Independent Dressing  Assistance: Limited assistance     Functional Limitations Info  Sight, Hearing, Speech Sight Info: Adequate Hearing Info: Adequate Speech Info: Adequate    SPECIAL CARE FACTORS FREQUENCY  PT (By licensed PT)     PT Frequency: Evaluated 1/9 and a minimum of 3X per week therapy recommended              Contractures Contractures Info: Not present    Additional Factors Info  Code Status, Allergies, Insulin Sliding Scale Code Status Info: DNR Allergies Info: Amlodipinen, Avelox, Carafate, Celecoxib, Diazepam, Doxazosin, Klonopin, Levofloxacin, Norvasc, Pantoprazole sodium, Ranitidine, Rofecoxib, Shellfish allergy, sulfa antibiotics, Sulfa drugs cross reactors, Codenie, Tape     Insulin Sliding Scale Info: Insulin 0-9 Units 3X with meals and 0-5 Units daily at bedtime       Current Medications (10/11/2016):  This is the current hospital active medication list Current Facility-Administered Medications  Medication Dose Route Frequency Provider Last Rate Last Dose  . 0.9 %  sodium chloride infusion  250 mL Intravenous PRN Mercy Riding, MD      . acetaminophen (TYLENOL) tablet 650 mg  650 mg Oral Q6H PRN Mercy Riding, MD   650 mg at 10/09/16 0129   Or  . acetaminophen (TYLENOL) suppository 650 mg  650 mg Rectal Q6H PRN Mercy Riding, MD      . ALPRAZolam Duanne Moron) tablet 0.25 mg  0.25 mg Oral BID PRN Mercy Riding, MD   0.25 mg at 10/09/16 0129  . amoxicillin-clavulanate (AUGMENTIN) 875-125  MG per tablet 1 tablet  1 tablet Oral BID Bufford Lope, DO   1 tablet at 10/11/16 1007  . aspirin EC tablet 81 mg  81 mg Oral Daily Mercy Riding, MD   81 mg at 10/11/16 1007  . atorvastatin (LIPITOR) tablet 40 mg  40 mg Oral QHS Mercy Riding, MD   40 mg at 10/10/16 2103  . enoxaparin (LOVENOX) injection 40 mg  40 mg Subcutaneous Q24H Mercy Riding, MD   40 mg at 10/10/16 1755  . feeding supplement (GLUCERNA SHAKE) (GLUCERNA SHAKE) liquid 237 mL  237 mL Oral BID BM Zenia Resides, MD   237 mL at 10/11/16  1008  . gabapentin (NEURONTIN) tablet 600 mg  600 mg Oral TID Mercy Riding, MD   600 mg at 10/11/16 1007  . insulin aspart (novoLOG) injection 0-5 Units  0-5 Units Subcutaneous QHS Mercy Riding, MD   2 Units at 10/08/16 2215  . insulin aspart (novoLOG) injection 0-9 Units  0-9 Units Subcutaneous TID WC Mercy Riding, MD   1 Units at 10/11/16 1219  . levothyroxine (SYNTHROID, LEVOTHROID) tablet 100 mcg  100 mcg Oral QAC breakfast Mercy Riding, MD   100 mcg at 10/11/16 0810  . MEDLINE mouth rinse  15 mL Mouth Rinse BID Zenia Resides, MD   15 mL at 10/11/16 1008  . metoprolol tartrate (LOPRESSOR) tablet 12.5 mg  12.5 mg Oral BID Mercy Riding, MD   12.5 mg at 10/11/16 1007  . mometasone-formoterol (DULERA) 200-5 MCG/ACT inhaler 2 puff  2 puff Inhalation BID Mercy Riding, MD   2 puff at 10/11/16 0911  . pantoprazole (PROTONIX) EC tablet 40 mg  40 mg Oral Daily Mercy Riding, MD   40 mg at 10/11/16 1007  . polyethylene glycol (MIRALAX / GLYCOLAX) packet 17 g  17 g Oral Daily PRN Mercy Riding, MD      . senna (SENOKOT) tablet 8.6 mg  1 tablet Oral BID Mercy Riding, MD   8.6 mg at 10/11/16 1007  . sodium chloride flush (NS) 0.9 % injection 3 mL  3 mL Intravenous Q12H Mercy Riding, MD   3 mL at 10/11/16 1008  . sodium chloride flush (NS) 0.9 % injection 3 mL  3 mL Intravenous PRN Mercy Riding, MD         Discharge Medications: Please see discharge summary for a list of discharge medications.  Relevant Imaging Results:  Relevant Lab Results:   Additional Information ss$#[id number].  Sable Feil, LCSW

## 2016-10-11 NOTE — Clinical Social Work Note (Signed)
Clinical Social Work Assessment  Patient Details  Name: Tracy Richards MRN: [id number] Date of Birth: 29-Oct-1928  Date of referral:  10/11/16               Reason for consult:  Facility Placement                Permission sought to share information with:  Family Supports Permission granted to share information::  Yes, Verbal Permission Granted  Name::     France Ravens  Agency::     Relationship::  Daughter  Contact Information:  [phone]  Housing/Transportation Living arrangements for the past 2 months:  Apartment Source of Information:  Patient, Adult Children (Daughter France Ravens) Patient Interpreter Needed:  None Criminal Activity/Legal Involvement Pertinent to Current Situation/Hospitalization:  No - Comment as needed Significant Relationships:  Adult Children (Daughter Horris Latino) Lives with:  Self Do you feel safe going back to the place where you live?  Yes (Daughter agreeable to SNF. Patient initally expressed that she could go home, but later expressed agreement.) Need for family participation in patient care:  Yes (Comment)  Care giving concerns:  Daughter and patient expressed agreement with rehab. Ms. Klinck initially expressed that she wanted to go home, but later indicated that she would go to facility.  Social Worker assessment / plan:  CSW talked with daughter by phone and patient at the bedside regarding discharge plan and recommendation of ST rehab. Ms. Carman Ching expressed agreement with rehab and provided CSW with preference - [facility], as patient has been there before. Patient also in agreement and indicated that [facility] will be convenient for daughter to get to.  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  Patient/Family's Understanding of and Emotional Response to Diagnosis, Current Treatment, and Prognosis:  Not discussed.  Emotional Assessment Appearance:  Appears stated age Attitude/Demeanor/Rapport:  Other (Appropriate) Affect (typically observed):  Appropriate Orientation:  Oriented to Self, Oriented to Place, Oriented to  Time, Oriented to Situation Alcohol / Substance use:  Tobacco Use, Alcohol Use, Illicit Drugs (Patient reported that she quit smoking in 1980 and does not drink or use illicit drugs) Psych involvement (Current and /or in the community):     Discharge Needs  Concerns to be addressed:  Discharge Planning Concerns Readmission within the last 30 days:  No Current discharge risk:  None Barriers to Discharge:  No Barriers Identified   Sable Feil, LCSW 10/11/2016, 3:35 PM

## 2016-10-11 NOTE — Clinical Social Work Note (Signed)
CSW advised by admissions director Tiffany at [facility] that d/c summary needed by 4:30 in order to get patient's medications from their pharmacy. If patient comes, they will not have meds for her. MD advised that.  Meghan Warshawsky Givens, MSW, LCSW Licensed Clinical Social Worker [place] [phone]

## 2016-10-11 NOTE — Progress Notes (Deleted)
Family Medicine Teaching Service Daily Progress Note Intern Pager: [phone]  Patient name: Tracy Richards record number: [id number] Date of birth: 1929-06-02 Age: 83 y.o. Gender: female  Primary Care Provider: Cyndi Bender, PA-C Consultants: Cardiology Code Status: DNR  Pt Overview and Major Events to Date:  None  Assessment and Plan: Tracy Nesta Ashleyis a 84 y.o.femalepresenting with productive cough and dyspnea. PMH is significant for COPD, atrial fibrillation, hypertension, hypothyroidism, hyperlipidemia, PVD.  Dyspnea and Cough likely 2/2 CAP: improved. Some crackles over LLL fields consistent with CXR. Daughter this afternoon and they would like to pursue SNF placement. -Continue home O2 via nasal canula. - Azithro and CTX 1/6-1/8 - Augmentin 875-125 mg twice a day 1/8>1/12 - Monitor respiratory status.  - IS - SW for SNF  HFpEF: Echocardiogram was done and showed EF of 55-60%, severely dilated left and right atrium and pulmonary arterial pressure of 74 mmHg. No other signs of fluid overload but elevated BNP to 1000 admission.  ACS ruled out by serial trop and EKG. Patient on torsemide 20-40 mg as needed at home. - restart home torsemide at 20 mg if BP stable - Holding home blood pressure medications. - Consult cardiology on 10/12/2016 for Echo finding  COPD: stable -Hold home Advair -Dulera inpatient -Albuterol nebulizers every 4 hours as needed for shortness of breath -Monitor respiratory status -Oxygen PRN. On 2L at home  Atrial fibrillation: [place] Vas score 4 (8.5% risk). Has Bled 2 (4.1% risk of major bleeding). Rate controlled on diltiazem and metoprolol at home. On baby aspirin due to risk of fall. Card agrees.  - Continue metoprolol. Hold diltiazem for low blood pressure - Continue aspirin for now.   Hypertension: Hypotensive this morning while asleep, but showed adequate response to bolus of 500 mL NS. Now 105/83. - Continue home metoprolol. Hold home  diltiazem and losartan for low blood pressure. - Stopped Lasix.   DM-2: A1c 11.5. Doesn't appear to be on any medication for this.  - SSI-thin. - Continue on home gabapentin.  Hypothyroidism: On Synthroid 100 mcg daily at home. - Continue on Synthroid.  Anxiety:On Xanax 0.5 mg twice a day as needed for anxiety. -Continue Xanax at 0.25 mg twice a day when necessary.  Chronic pain: On Percocet and oxycodone at home but denies taking -Tylenol when necessary.  FEN/GI:  -Carb modified diet. -Protonix  Prophylaxis: Lovenox  Disposition: Patient has SNF placement but SNF couldn't accept her today  Subjective:  No complaints this morning.   Objective: Temp:  [98 F (36.7 C)-99.1 F (37.3 C)] 98 F (36.7 C) (01/09 0900) Pulse Rate:  [85-100] 85 (01/09 0900) Resp:  [18-20] 18 (01/09 0900) BP: (109-151)/(56-82) 151/67 (01/09 0944) SpO2:  [94 %-98 %] 94 % (01/09 0911) Weight:  [60.2 kg (132 lb 12.8 oz)] 60.2 kg (132 lb 12.8 oz) (01/09 0500) Physical Exam: GEN: frail lady, lying in bed with [place] in place, appears comfortable Oropharynx: mmm CVS: irregularly irregular, normal rate on monitior, nl s1 & s2, no murmurs, no edema RESP: [place] in place, no increased work of breathing, good air movement bilaterally, no rhonchi, bibasilar crackles left > right GI: Bowel sounds present and normal, soft, non-tender MSK: tenderness to palpation over her left chest SKIN: no apparent skin lesion NEURO: alert and oiented appropriately, no gross defecits  PSYCH: euthymic mood with congruent affect Laboratory:  Recent Labs Lab 10/09/16 0737 10/10/16 1023 10/11/16 1136  WBC 11.9* 9.8 8.0  HGB 10.0* 10.0* 10.5*  HCT 30.7* 30.5*  32.5*  PLT 132* 154 171    Recent Labs Lab 10/08/16 1236 10/09/16 0737 10/10/16 1023 10/11/16 1136  NA 138 139 134* 134*  K 3.1* 2.9* 4.2 4.4  CL 103 105 103 103  CO2 24 27 26 24   BUN 9 9 11 11   CREATININE 0.73 0.77 0.74 0.70  CALCIUM 8.3* 8.4* 8.4*  8.7*  PROT 6.5  --   --   --   BILITOT 1.1  --   --   --   ALKPHOS 100  --   --   --   ALT 10*  --   --   --   AST 14*  --   --   --   GLUCOSE 148* 101* 129* 135*   Imaging/Diagnostic Tests:  Wendee Beavers, MD.  PGY-2, [place] [facility] Pager [phone] 10/11/16  4:51 PM

## 2016-10-12 DIAGNOSIS — J9600 Acute respiratory failure, unspecified whether with hypoxia or hypercapnia: Secondary | ICD-10-CM | POA: Diagnosis not present

## 2016-10-12 DIAGNOSIS — I110 Hypertensive heart disease with heart failure: Secondary | ICD-10-CM | POA: Diagnosis not present

## 2016-10-12 DIAGNOSIS — E1140 Type 2 diabetes mellitus with diabetic neuropathy, unspecified: Secondary | ICD-10-CM | POA: Diagnosis not present

## 2016-10-12 DIAGNOSIS — J189 Pneumonia, unspecified organism: Secondary | ICD-10-CM | POA: Diagnosis not present

## 2016-10-12 DIAGNOSIS — I5020 Unspecified systolic (congestive) heart failure: Secondary | ICD-10-CM | POA: Diagnosis not present

## 2016-10-12 DIAGNOSIS — Z9981 Dependence on supplemental oxygen: Secondary | ICD-10-CM | POA: Diagnosis not present

## 2016-10-12 DIAGNOSIS — I509 Heart failure, unspecified: Secondary | ICD-10-CM | POA: Diagnosis not present

## 2016-10-12 DIAGNOSIS — F419 Anxiety disorder, unspecified: Secondary | ICD-10-CM | POA: Diagnosis not present

## 2016-10-12 DIAGNOSIS — K5900 Constipation, unspecified: Secondary | ICD-10-CM | POA: Diagnosis not present

## 2016-10-12 DIAGNOSIS — R110 Nausea: Secondary | ICD-10-CM | POA: Diagnosis not present

## 2016-10-12 DIAGNOSIS — J181 Lobar pneumonia, unspecified organism: Secondary | ICD-10-CM | POA: Diagnosis not present

## 2016-10-12 DIAGNOSIS — E119 Type 2 diabetes mellitus without complications: Secondary | ICD-10-CM | POA: Diagnosis not present

## 2016-10-12 DIAGNOSIS — I272 Pulmonary hypertension, unspecified: Secondary | ICD-10-CM | POA: Diagnosis not present

## 2016-10-12 DIAGNOSIS — K219 Gastro-esophageal reflux disease without esophagitis: Secondary | ICD-10-CM | POA: Diagnosis not present

## 2016-10-12 DIAGNOSIS — J440 Chronic obstructive pulmonary disease with acute lower respiratory infection: Secondary | ICD-10-CM | POA: Diagnosis not present

## 2016-10-12 DIAGNOSIS — E039 Hypothyroidism, unspecified: Secondary | ICD-10-CM | POA: Diagnosis not present

## 2016-10-12 DIAGNOSIS — I4891 Unspecified atrial fibrillation: Secondary | ICD-10-CM | POA: Diagnosis not present

## 2016-10-12 DIAGNOSIS — E1151 Type 2 diabetes mellitus with diabetic peripheral angiopathy without gangrene: Secondary | ICD-10-CM | POA: Diagnosis not present

## 2016-10-12 DIAGNOSIS — E785 Hyperlipidemia, unspecified: Secondary | ICD-10-CM | POA: Diagnosis not present

## 2016-10-12 DIAGNOSIS — J9611 Chronic respiratory failure with hypoxia: Secondary | ICD-10-CM | POA: Diagnosis not present

## 2016-10-12 DIAGNOSIS — J449 Chronic obstructive pulmonary disease, unspecified: Secondary | ICD-10-CM | POA: Diagnosis not present

## 2016-10-12 DIAGNOSIS — I739 Peripheral vascular disease, unspecified: Secondary | ICD-10-CM | POA: Diagnosis not present

## 2016-10-12 DIAGNOSIS — Z7982 Long term (current) use of aspirin: Secondary | ICD-10-CM | POA: Diagnosis not present

## 2016-10-12 LAB — GLUCOSE, CAPILLARY
Glucose-Capillary: 115 mg/dL — ABNORMAL HIGH (ref 65–99)
Glucose-Capillary: 131 mg/dL — ABNORMAL HIGH (ref 65–99)

## 2016-10-12 MED ORDER — DILTIAZEM HCL ER COATED BEADS 180 MG PO CP24
180.0000 mg | ORAL_CAPSULE | Freq: Every day | ORAL | Status: DC
  Administered 2016-10-12: 180 mg via ORAL
  Filled 2016-10-12: qty 1

## 2016-10-12 MED ORDER — GABAPENTIN 600 MG PO TABS: 600.0000 mg | ORAL_TABLET | Freq: Two times a day (BID) | ORAL | 0 refills | Status: AC

## 2016-10-12 MED ORDER — GABAPENTIN 600 MG PO TABS: 600.0000 mg | ORAL_TABLET | Freq: Two times a day (BID) | ORAL | Status: DC

## 2016-10-12 MED ORDER — GLUCERNA SHAKE PO LIQD: 237.0000 mL | Freq: Two times a day (BID) | ORAL | 0 refills | Status: AC

## 2016-10-12 MED ORDER — POLYETHYLENE GLYCOL 3350 17 G PO PACK: 17.0000 g | PACK | Freq: Every day | ORAL | 0 refills | Status: DC | PRN

## 2016-10-12 MED ORDER — AMOXICILLIN-POT CLAVULANATE 875-125 MG PO TABS: 1.0000 | ORAL_TABLET | Freq: Two times a day (BID) | ORAL | 0 refills | Status: DC

## 2016-10-12 NOTE — Progress Notes (Signed)
Called report to [place] at [facility].

## 2016-10-12 NOTE — Care Management Important Message (Signed)
Important Message  Patient Details  Name: TYSHAWNA STOTTLEMYRE MRN: [id number] Date of Birth: 01-31-1929   Medicare Important Message Given:  Yes    Stevee Valenta 10/12/2016, 11:27 AM

## 2016-10-12 NOTE — Progress Notes (Signed)
Pt left floor by stretcher with PTAR to [facility].

## 2016-10-12 NOTE — Progress Notes (Signed)
Transitions of Care Pharmacy Note  Plan:  Educated on: - dose change for gabapentin - discontinuance of losartan and metoprolol tartrate - completing course of Augmentin   Addressed concerns regarding: - patient did not address any medication-related concerns at this time  Recommend - taking home medications as prescribed  Follow-up: - Gabapentin dose reduction given patient's CrCl of <60 ml/min. Follow-up outpatient if neuropathic pain is not adequately managed on the new dose.  - Infection resolution at outpatient follow-up. - Hypertension management. Further medication management to be assessed in outpatient.  --------------------------------------------- Tracy Richards is an 83 y.o. female who presents with a chief complaint dyspnea and cough due to PNA and CHF exacerbation. In anticipation of discharge, pharmacy has reviewed this patient's prior to admission medication history, as well as current inpatient medications listed per the [facility].  Current medication indications, dosing, frequency, and notable side effects reviewed with the patient. The patient verbalized understanding of current inpatient medication regimen and is aware that the After Visit Summary when presented, will represent the most accurate medication list at discharge.   Tracy Richards expressed concerns regarding affording medications in the future, although she says this isn't an issue at this time.  Assessment: Understanding of regimen: fair Understanding of indications: good Potential of compliance: good Barriers to Obtaining Medications: No  Patient instructed to contact inpatient pharmacy team with further questions or concerns if needed.    Time spent preparing for discharge counseling: 10 Time spent counseling patient: 33   Thank you for allowing pharmacy to be a part of this patient's care.  Leroy Libman

## 2016-10-13 DIAGNOSIS — I4891 Unspecified atrial fibrillation: Secondary | ICD-10-CM | POA: Diagnosis not present

## 2016-10-13 DIAGNOSIS — I509 Heart failure, unspecified: Secondary | ICD-10-CM | POA: Diagnosis not present

## 2016-10-13 DIAGNOSIS — J449 Chronic obstructive pulmonary disease, unspecified: Secondary | ICD-10-CM | POA: Diagnosis not present

## 2016-10-13 DIAGNOSIS — J9611 Chronic respiratory failure with hypoxia: Secondary | ICD-10-CM | POA: Diagnosis not present

## 2016-10-13 DIAGNOSIS — J189 Pneumonia, unspecified organism: Secondary | ICD-10-CM | POA: Diagnosis not present

## 2016-10-13 LAB — CULTURE, BLOOD (ROUTINE X 2)
CULTURE: NO GROWTH
Culture: NO GROWTH

## 2016-10-18 DIAGNOSIS — J189 Pneumonia, unspecified organism: Secondary | ICD-10-CM | POA: Diagnosis not present

## 2016-10-18 DIAGNOSIS — I4891 Unspecified atrial fibrillation: Secondary | ICD-10-CM | POA: Diagnosis not present

## 2016-10-18 DIAGNOSIS — I509 Heart failure, unspecified: Secondary | ICD-10-CM | POA: Diagnosis not present

## 2016-10-18 DIAGNOSIS — E119 Type 2 diabetes mellitus without complications: Secondary | ICD-10-CM | POA: Diagnosis not present

## 2016-10-21 DIAGNOSIS — E119 Type 2 diabetes mellitus without complications: Secondary | ICD-10-CM | POA: Diagnosis not present

## 2016-10-21 DIAGNOSIS — I509 Heart failure, unspecified: Secondary | ICD-10-CM | POA: Diagnosis not present

## 2016-10-21 DIAGNOSIS — J189 Pneumonia, unspecified organism: Secondary | ICD-10-CM | POA: Diagnosis not present

## 2016-10-21 DIAGNOSIS — I4891 Unspecified atrial fibrillation: Secondary | ICD-10-CM | POA: Diagnosis not present

## 2016-10-26 DIAGNOSIS — K5900 Constipation, unspecified: Secondary | ICD-10-CM | POA: Diagnosis not present

## 2016-10-26 DIAGNOSIS — K219 Gastro-esophageal reflux disease without esophagitis: Secondary | ICD-10-CM | POA: Diagnosis not present

## 2016-10-26 DIAGNOSIS — R110 Nausea: Secondary | ICD-10-CM | POA: Diagnosis not present

## 2016-10-26 DIAGNOSIS — I509 Heart failure, unspecified: Secondary | ICD-10-CM | POA: Diagnosis not present

## 2016-10-28 DIAGNOSIS — H8110 Benign paroxysmal vertigo, unspecified ear: Secondary | ICD-10-CM | POA: Diagnosis not present

## 2016-10-28 DIAGNOSIS — S3210XD Unspecified fracture of sacrum, subsequent encounter for fracture with routine healing: Secondary | ICD-10-CM | POA: Diagnosis not present

## 2016-10-28 DIAGNOSIS — E1142 Type 2 diabetes mellitus with diabetic polyneuropathy: Secondary | ICD-10-CM | POA: Diagnosis not present

## 2016-10-28 DIAGNOSIS — J449 Chronic obstructive pulmonary disease, unspecified: Secondary | ICD-10-CM | POA: Diagnosis not present

## 2016-10-28 DIAGNOSIS — J9611 Chronic respiratory failure with hypoxia: Secondary | ICD-10-CM | POA: Diagnosis not present

## 2016-10-28 DIAGNOSIS — I10 Essential (primary) hypertension: Secondary | ICD-10-CM | POA: Diagnosis not present

## 2016-10-31 DIAGNOSIS — S3210XD Unspecified fracture of sacrum, subsequent encounter for fracture with routine healing: Secondary | ICD-10-CM | POA: Diagnosis not present

## 2016-10-31 DIAGNOSIS — Z95828 Presence of other vascular implants and grafts: Secondary | ICD-10-CM | POA: Diagnosis not present

## 2016-10-31 DIAGNOSIS — J9611 Chronic respiratory failure with hypoxia: Secondary | ICD-10-CM | POA: Diagnosis not present

## 2016-10-31 DIAGNOSIS — M810 Age-related osteoporosis without current pathological fracture: Secondary | ICD-10-CM | POA: Diagnosis not present

## 2016-10-31 DIAGNOSIS — Z7951 Long term (current) use of inhaled steroids: Secondary | ICD-10-CM | POA: Diagnosis not present

## 2016-10-31 DIAGNOSIS — I110 Hypertensive heart disease with heart failure: Secondary | ICD-10-CM | POA: Diagnosis not present

## 2016-10-31 DIAGNOSIS — F329 Major depressive disorder, single episode, unspecified: Secondary | ICD-10-CM | POA: Diagnosis not present

## 2016-10-31 DIAGNOSIS — H8110 Benign paroxysmal vertigo, unspecified ear: Secondary | ICD-10-CM | POA: Diagnosis not present

## 2016-10-31 DIAGNOSIS — Z87891 Personal history of nicotine dependence: Secondary | ICD-10-CM | POA: Diagnosis not present

## 2016-10-31 DIAGNOSIS — J449 Chronic obstructive pulmonary disease, unspecified: Secondary | ICD-10-CM | POA: Diagnosis not present

## 2016-10-31 DIAGNOSIS — E1142 Type 2 diabetes mellitus with diabetic polyneuropathy: Secondary | ICD-10-CM | POA: Diagnosis not present

## 2016-10-31 DIAGNOSIS — Z9981 Dependence on supplemental oxygen: Secondary | ICD-10-CM | POA: Diagnosis not present

## 2016-10-31 DIAGNOSIS — I5020 Unspecified systolic (congestive) heart failure: Secondary | ICD-10-CM | POA: Diagnosis not present

## 2016-10-31 DIAGNOSIS — Z7982 Long term (current) use of aspirin: Secondary | ICD-10-CM | POA: Diagnosis not present

## 2016-11-01 DIAGNOSIS — I110 Hypertensive heart disease with heart failure: Secondary | ICD-10-CM | POA: Diagnosis not present

## 2016-11-01 DIAGNOSIS — J9611 Chronic respiratory failure with hypoxia: Secondary | ICD-10-CM | POA: Diagnosis not present

## 2016-11-01 DIAGNOSIS — J449 Chronic obstructive pulmonary disease, unspecified: Secondary | ICD-10-CM | POA: Diagnosis not present

## 2016-11-01 DIAGNOSIS — I5020 Unspecified systolic (congestive) heart failure: Secondary | ICD-10-CM | POA: Diagnosis not present

## 2016-11-01 DIAGNOSIS — E1142 Type 2 diabetes mellitus with diabetic polyneuropathy: Secondary | ICD-10-CM | POA: Diagnosis not present

## 2016-11-01 DIAGNOSIS — H8110 Benign paroxysmal vertigo, unspecified ear: Secondary | ICD-10-CM | POA: Diagnosis not present

## 2016-11-02 DIAGNOSIS — E1142 Type 2 diabetes mellitus with diabetic polyneuropathy: Secondary | ICD-10-CM | POA: Diagnosis not present

## 2016-11-02 DIAGNOSIS — I5020 Unspecified systolic (congestive) heart failure: Secondary | ICD-10-CM | POA: Diagnosis not present

## 2016-11-02 DIAGNOSIS — J9611 Chronic respiratory failure with hypoxia: Secondary | ICD-10-CM | POA: Diagnosis not present

## 2016-11-02 DIAGNOSIS — H8110 Benign paroxysmal vertigo, unspecified ear: Secondary | ICD-10-CM | POA: Diagnosis not present

## 2016-11-02 DIAGNOSIS — I110 Hypertensive heart disease with heart failure: Secondary | ICD-10-CM | POA: Diagnosis not present

## 2016-11-02 DIAGNOSIS — J449 Chronic obstructive pulmonary disease, unspecified: Secondary | ICD-10-CM | POA: Diagnosis not present

## 2016-11-03 DIAGNOSIS — Z6826 Body mass index (BMI) 26.0-26.9, adult: Secondary | ICD-10-CM | POA: Diagnosis not present

## 2016-11-03 DIAGNOSIS — I5030 Unspecified diastolic (congestive) heart failure: Secondary | ICD-10-CM | POA: Diagnosis not present

## 2016-11-03 DIAGNOSIS — I4891 Unspecified atrial fibrillation: Secondary | ICD-10-CM | POA: Diagnosis not present

## 2016-11-03 DIAGNOSIS — E663 Overweight: Secondary | ICD-10-CM | POA: Diagnosis not present

## 2016-11-03 DIAGNOSIS — Z79899 Other long term (current) drug therapy: Secondary | ICD-10-CM | POA: Diagnosis not present

## 2016-11-03 DIAGNOSIS — I10 Essential (primary) hypertension: Secondary | ICD-10-CM | POA: Diagnosis not present

## 2016-11-04 DIAGNOSIS — E1142 Type 2 diabetes mellitus with diabetic polyneuropathy: Secondary | ICD-10-CM | POA: Diagnosis not present

## 2016-11-04 DIAGNOSIS — J9611 Chronic respiratory failure with hypoxia: Secondary | ICD-10-CM | POA: Diagnosis not present

## 2016-11-04 DIAGNOSIS — I110 Hypertensive heart disease with heart failure: Secondary | ICD-10-CM | POA: Diagnosis not present

## 2016-11-04 DIAGNOSIS — I5020 Unspecified systolic (congestive) heart failure: Secondary | ICD-10-CM | POA: Diagnosis not present

## 2016-11-04 DIAGNOSIS — H8110 Benign paroxysmal vertigo, unspecified ear: Secondary | ICD-10-CM | POA: Diagnosis not present

## 2016-11-04 DIAGNOSIS — I10 Essential (primary) hypertension: Secondary | ICD-10-CM | POA: Diagnosis not present

## 2016-11-04 DIAGNOSIS — J449 Chronic obstructive pulmonary disease, unspecified: Secondary | ICD-10-CM | POA: Diagnosis not present

## 2016-11-08 DIAGNOSIS — H8110 Benign paroxysmal vertigo, unspecified ear: Secondary | ICD-10-CM | POA: Diagnosis not present

## 2016-11-08 DIAGNOSIS — J9611 Chronic respiratory failure with hypoxia: Secondary | ICD-10-CM | POA: Diagnosis not present

## 2016-11-08 DIAGNOSIS — J449 Chronic obstructive pulmonary disease, unspecified: Secondary | ICD-10-CM | POA: Diagnosis not present

## 2016-11-08 DIAGNOSIS — E1142 Type 2 diabetes mellitus with diabetic polyneuropathy: Secondary | ICD-10-CM | POA: Diagnosis not present

## 2016-11-08 DIAGNOSIS — I5020 Unspecified systolic (congestive) heart failure: Secondary | ICD-10-CM | POA: Diagnosis not present

## 2016-11-08 DIAGNOSIS — I110 Hypertensive heart disease with heart failure: Secondary | ICD-10-CM | POA: Diagnosis not present

## 2016-11-11 DIAGNOSIS — J449 Chronic obstructive pulmonary disease, unspecified: Secondary | ICD-10-CM | POA: Diagnosis not present

## 2016-11-11 DIAGNOSIS — H8110 Benign paroxysmal vertigo, unspecified ear: Secondary | ICD-10-CM | POA: Diagnosis not present

## 2016-11-11 DIAGNOSIS — I5020 Unspecified systolic (congestive) heart failure: Secondary | ICD-10-CM | POA: Diagnosis not present

## 2016-11-11 DIAGNOSIS — J9611 Chronic respiratory failure with hypoxia: Secondary | ICD-10-CM | POA: Diagnosis not present

## 2016-11-11 DIAGNOSIS — E1142 Type 2 diabetes mellitus with diabetic polyneuropathy: Secondary | ICD-10-CM | POA: Diagnosis not present

## 2016-11-11 DIAGNOSIS — I110 Hypertensive heart disease with heart failure: Secondary | ICD-10-CM | POA: Diagnosis not present

## 2016-11-14 DIAGNOSIS — H8110 Benign paroxysmal vertigo, unspecified ear: Secondary | ICD-10-CM | POA: Diagnosis not present

## 2016-11-14 DIAGNOSIS — J9611 Chronic respiratory failure with hypoxia: Secondary | ICD-10-CM | POA: Diagnosis not present

## 2016-11-14 DIAGNOSIS — I110 Hypertensive heart disease with heart failure: Secondary | ICD-10-CM | POA: Diagnosis not present

## 2016-11-14 DIAGNOSIS — I5020 Unspecified systolic (congestive) heart failure: Secondary | ICD-10-CM | POA: Diagnosis not present

## 2016-11-14 DIAGNOSIS — E1142 Type 2 diabetes mellitus with diabetic polyneuropathy: Secondary | ICD-10-CM | POA: Diagnosis not present

## 2016-11-14 DIAGNOSIS — J449 Chronic obstructive pulmonary disease, unspecified: Secondary | ICD-10-CM | POA: Diagnosis not present

## 2016-11-16 DIAGNOSIS — I4891 Unspecified atrial fibrillation: Secondary | ICD-10-CM | POA: Diagnosis not present

## 2016-11-16 DIAGNOSIS — J449 Chronic obstructive pulmonary disease, unspecified: Secondary | ICD-10-CM | POA: Diagnosis not present

## 2016-11-16 DIAGNOSIS — I110 Hypertensive heart disease with heart failure: Secondary | ICD-10-CM | POA: Diagnosis not present

## 2016-11-16 DIAGNOSIS — E7800 Pure hypercholesterolemia, unspecified: Secondary | ICD-10-CM | POA: Diagnosis not present

## 2016-11-16 DIAGNOSIS — Z79899 Other long term (current) drug therapy: Secondary | ICD-10-CM | POA: Diagnosis not present

## 2016-11-16 DIAGNOSIS — I10 Essential (primary) hypertension: Secondary | ICD-10-CM | POA: Diagnosis not present

## 2016-11-16 DIAGNOSIS — E119 Type 2 diabetes mellitus without complications: Secondary | ICD-10-CM | POA: Diagnosis not present

## 2016-11-16 DIAGNOSIS — H8110 Benign paroxysmal vertigo, unspecified ear: Secondary | ICD-10-CM | POA: Diagnosis not present

## 2016-11-16 DIAGNOSIS — J9610 Chronic respiratory failure, unspecified whether with hypoxia or hypercapnia: Secondary | ICD-10-CM | POA: Diagnosis not present

## 2016-11-16 DIAGNOSIS — E1142 Type 2 diabetes mellitus with diabetic polyneuropathy: Secondary | ICD-10-CM | POA: Diagnosis not present

## 2016-11-16 DIAGNOSIS — Z6827 Body mass index (BMI) 27.0-27.9, adult: Secondary | ICD-10-CM | POA: Diagnosis not present

## 2016-11-16 DIAGNOSIS — I5020 Unspecified systolic (congestive) heart failure: Secondary | ICD-10-CM | POA: Diagnosis not present

## 2016-11-16 DIAGNOSIS — I701 Atherosclerosis of renal artery: Secondary | ICD-10-CM | POA: Diagnosis not present

## 2016-11-16 DIAGNOSIS — I5032 Chronic diastolic (congestive) heart failure: Secondary | ICD-10-CM | POA: Diagnosis not present

## 2016-11-16 DIAGNOSIS — J9611 Chronic respiratory failure with hypoxia: Secondary | ICD-10-CM | POA: Diagnosis not present

## 2016-11-18 DIAGNOSIS — I701 Atherosclerosis of renal artery: Secondary | ICD-10-CM | POA: Diagnosis not present

## 2016-11-18 DIAGNOSIS — R011 Cardiac murmur, unspecified: Secondary | ICD-10-CM | POA: Diagnosis not present

## 2016-11-18 DIAGNOSIS — J449 Chronic obstructive pulmonary disease, unspecified: Secondary | ICD-10-CM | POA: Diagnosis not present

## 2016-11-18 DIAGNOSIS — I480 Paroxysmal atrial fibrillation: Secondary | ICD-10-CM | POA: Diagnosis not present

## 2016-11-18 DIAGNOSIS — I10 Essential (primary) hypertension: Secondary | ICD-10-CM | POA: Diagnosis not present

## 2016-11-18 DIAGNOSIS — E119 Type 2 diabetes mellitus without complications: Secondary | ICD-10-CM | POA: Diagnosis not present

## 2016-11-18 DIAGNOSIS — I5032 Chronic diastolic (congestive) heart failure: Secondary | ICD-10-CM | POA: Diagnosis not present

## 2016-11-18 DIAGNOSIS — I708 Atherosclerosis of other arteries: Secondary | ICD-10-CM | POA: Diagnosis not present

## 2016-11-21 DIAGNOSIS — I5020 Unspecified systolic (congestive) heart failure: Secondary | ICD-10-CM | POA: Diagnosis not present

## 2016-11-21 DIAGNOSIS — I110 Hypertensive heart disease with heart failure: Secondary | ICD-10-CM | POA: Diagnosis not present

## 2016-11-21 DIAGNOSIS — J9611 Chronic respiratory failure with hypoxia: Secondary | ICD-10-CM | POA: Diagnosis not present

## 2016-11-21 DIAGNOSIS — E1142 Type 2 diabetes mellitus with diabetic polyneuropathy: Secondary | ICD-10-CM | POA: Diagnosis not present

## 2016-11-21 DIAGNOSIS — H8110 Benign paroxysmal vertigo, unspecified ear: Secondary | ICD-10-CM | POA: Diagnosis not present

## 2016-11-21 DIAGNOSIS — J449 Chronic obstructive pulmonary disease, unspecified: Secondary | ICD-10-CM | POA: Diagnosis not present

## 2016-11-25 DIAGNOSIS — H8110 Benign paroxysmal vertigo, unspecified ear: Secondary | ICD-10-CM | POA: Diagnosis not present

## 2016-11-25 DIAGNOSIS — I5020 Unspecified systolic (congestive) heart failure: Secondary | ICD-10-CM | POA: Diagnosis not present

## 2016-11-25 DIAGNOSIS — E1142 Type 2 diabetes mellitus with diabetic polyneuropathy: Secondary | ICD-10-CM | POA: Diagnosis not present

## 2016-11-25 DIAGNOSIS — I110 Hypertensive heart disease with heart failure: Secondary | ICD-10-CM | POA: Diagnosis not present

## 2016-11-25 DIAGNOSIS — J9611 Chronic respiratory failure with hypoxia: Secondary | ICD-10-CM | POA: Diagnosis not present

## 2016-11-25 DIAGNOSIS — J449 Chronic obstructive pulmonary disease, unspecified: Secondary | ICD-10-CM | POA: Diagnosis not present

## 2016-11-28 DIAGNOSIS — H8110 Benign paroxysmal vertigo, unspecified ear: Secondary | ICD-10-CM | POA: Diagnosis not present

## 2016-11-28 DIAGNOSIS — J9611 Chronic respiratory failure with hypoxia: Secondary | ICD-10-CM | POA: Diagnosis not present

## 2016-11-28 DIAGNOSIS — J449 Chronic obstructive pulmonary disease, unspecified: Secondary | ICD-10-CM | POA: Diagnosis not present

## 2016-11-28 DIAGNOSIS — E1142 Type 2 diabetes mellitus with diabetic polyneuropathy: Secondary | ICD-10-CM | POA: Diagnosis not present

## 2016-11-28 DIAGNOSIS — I5020 Unspecified systolic (congestive) heart failure: Secondary | ICD-10-CM | POA: Diagnosis not present

## 2016-11-28 DIAGNOSIS — I110 Hypertensive heart disease with heart failure: Secondary | ICD-10-CM | POA: Diagnosis not present

## 2016-12-01 DIAGNOSIS — H8110 Benign paroxysmal vertigo, unspecified ear: Secondary | ICD-10-CM | POA: Diagnosis not present

## 2016-12-01 DIAGNOSIS — J9611 Chronic respiratory failure with hypoxia: Secondary | ICD-10-CM | POA: Diagnosis not present

## 2016-12-01 DIAGNOSIS — E1142 Type 2 diabetes mellitus with diabetic polyneuropathy: Secondary | ICD-10-CM | POA: Diagnosis not present

## 2016-12-01 DIAGNOSIS — I110 Hypertensive heart disease with heart failure: Secondary | ICD-10-CM | POA: Diagnosis not present

## 2016-12-01 DIAGNOSIS — J449 Chronic obstructive pulmonary disease, unspecified: Secondary | ICD-10-CM | POA: Diagnosis not present

## 2016-12-01 DIAGNOSIS — I5020 Unspecified systolic (congestive) heart failure: Secondary | ICD-10-CM | POA: Diagnosis not present

## 2016-12-08 DIAGNOSIS — H348322 Tributary (branch) retinal vein occlusion, left eye, stable: Secondary | ICD-10-CM | POA: Diagnosis not present

## 2016-12-13 DIAGNOSIS — I5020 Unspecified systolic (congestive) heart failure: Secondary | ICD-10-CM | POA: Diagnosis not present

## 2016-12-13 DIAGNOSIS — E1142 Type 2 diabetes mellitus with diabetic polyneuropathy: Secondary | ICD-10-CM | POA: Diagnosis not present

## 2016-12-13 DIAGNOSIS — I110 Hypertensive heart disease with heart failure: Secondary | ICD-10-CM | POA: Diagnosis not present

## 2016-12-13 DIAGNOSIS — J449 Chronic obstructive pulmonary disease, unspecified: Secondary | ICD-10-CM | POA: Diagnosis not present

## 2016-12-13 DIAGNOSIS — H8110 Benign paroxysmal vertigo, unspecified ear: Secondary | ICD-10-CM | POA: Diagnosis not present

## 2016-12-13 DIAGNOSIS — J9611 Chronic respiratory failure with hypoxia: Secondary | ICD-10-CM | POA: Diagnosis not present

## 2016-12-26 DIAGNOSIS — E1142 Type 2 diabetes mellitus with diabetic polyneuropathy: Secondary | ICD-10-CM | POA: Diagnosis not present

## 2016-12-26 DIAGNOSIS — J449 Chronic obstructive pulmonary disease, unspecified: Secondary | ICD-10-CM | POA: Diagnosis not present

## 2016-12-26 DIAGNOSIS — H8110 Benign paroxysmal vertigo, unspecified ear: Secondary | ICD-10-CM | POA: Diagnosis not present

## 2016-12-26 DIAGNOSIS — J9611 Chronic respiratory failure with hypoxia: Secondary | ICD-10-CM | POA: Diagnosis not present

## 2016-12-26 DIAGNOSIS — I5020 Unspecified systolic (congestive) heart failure: Secondary | ICD-10-CM | POA: Diagnosis not present

## 2016-12-26 DIAGNOSIS — I110 Hypertensive heart disease with heart failure: Secondary | ICD-10-CM | POA: Diagnosis not present

## 2017-01-09 ENCOUNTER — Emergency Department
Admission: EM | Admit: 2017-01-09 | Discharge: 2017-01-09 | Disposition: A | Discharge status: Home / Self Care | Payer: Medicare Other | Attending: Emergency Medicine | Admitting: Emergency Medicine

## 2017-01-09 ENCOUNTER — Encounter: Payer: Self-pay | Admitting: Emergency Medicine

## 2017-01-09 ENCOUNTER — Emergency Department: Payer: Medicare Other

## 2017-01-09 DIAGNOSIS — J45909 Unspecified asthma, uncomplicated: Secondary | ICD-10-CM | POA: Insufficient documentation

## 2017-01-09 DIAGNOSIS — Z87891 Personal history of nicotine dependence: Secondary | ICD-10-CM | POA: Insufficient documentation

## 2017-01-09 DIAGNOSIS — Y999 Unspecified external cause status: Secondary | ICD-10-CM | POA: Insufficient documentation

## 2017-01-09 DIAGNOSIS — E039 Hypothyroidism, unspecified: Secondary | ICD-10-CM | POA: Insufficient documentation

## 2017-01-09 DIAGNOSIS — Z7982 Long term (current) use of aspirin: Secondary | ICD-10-CM | POA: Diagnosis not present

## 2017-01-09 DIAGNOSIS — M25561 Pain in right knee: Secondary | ICD-10-CM | POA: Diagnosis not present

## 2017-01-09 DIAGNOSIS — Z79899 Other long term (current) drug therapy: Secondary | ICD-10-CM | POA: Diagnosis not present

## 2017-01-09 DIAGNOSIS — W010XXA Fall on same level from slipping, tripping and stumbling without subsequent striking against object, initial encounter: Secondary | ICD-10-CM | POA: Diagnosis not present

## 2017-01-09 DIAGNOSIS — Y939 Activity, unspecified: Secondary | ICD-10-CM | POA: Insufficient documentation

## 2017-01-09 DIAGNOSIS — M25569 Pain in unspecified knee: Secondary | ICD-10-CM | POA: Diagnosis not present

## 2017-01-09 DIAGNOSIS — Y929 Unspecified place or not applicable: Secondary | ICD-10-CM | POA: Diagnosis not present

## 2017-01-09 DIAGNOSIS — J449 Chronic obstructive pulmonary disease, unspecified: Secondary | ICD-10-CM | POA: Diagnosis not present

## 2017-01-09 DIAGNOSIS — I10 Essential (primary) hypertension: Secondary | ICD-10-CM | POA: Insufficient documentation

## 2017-01-09 DIAGNOSIS — E119 Type 2 diabetes mellitus without complications: Secondary | ICD-10-CM | POA: Insufficient documentation

## 2017-01-09 DIAGNOSIS — S8991XA Unspecified injury of right lower leg, initial encounter: Secondary | ICD-10-CM | POA: Diagnosis not present

## 2017-01-09 DIAGNOSIS — S81011A Laceration without foreign body, right knee, initial encounter: Secondary | ICD-10-CM | POA: Diagnosis not present

## 2017-01-09 DIAGNOSIS — S81811A Laceration without foreign body, right lower leg, initial encounter: Secondary | ICD-10-CM

## 2017-01-09 DIAGNOSIS — W19XXXA Unspecified fall, initial encounter: Secondary | ICD-10-CM | POA: Diagnosis not present

## 2017-01-09 DIAGNOSIS — S8001XA Contusion of right knee, initial encounter: Secondary | ICD-10-CM

## 2017-01-09 MED ORDER — BACITRACIN ZINC 500 UNIT/GM EX OINT
TOPICAL_OINTMENT | CUTANEOUS | Status: AC
  Filled 2017-01-09: qty 0.9

## 2017-01-09 MED ORDER — OXYCODONE-ACETAMINOPHEN 5-325 MG PO TABS
2.0000 | ORAL_TABLET | Freq: Once | ORAL | Status: AC
  Administered 2017-01-09: 2 via ORAL
  Filled 2017-01-09: qty 2

## 2017-01-09 MED ORDER — LIDOCAINE HCL (PF) 1 % IJ SOLN
5.0000 mL | Freq: Once | INTRAMUSCULAR | Status: DC
  Filled 2017-01-09: qty 5

## 2017-01-09 NOTE — Discharge Instructions (Signed)
Follow up in 2 weeks for staple removal

## 2017-01-09 NOTE — ED Provider Notes (Signed)
[facility] Emergency Department Provider Note       Time seen: ----------------------------------------- 10:25 AM on 01/09/2017 -----------------------------------------     I have reviewed the triage vital signs and the nursing notes.   HISTORY   Chief Complaint No chief complaint on file.    HPI Tracy Richards is a 83 y.o. female who presents to the ED for a mechanical fall. Patient states she tripped over her oxygen tubing and sustained laceration to her right knee with associated pain. Patient was ambulatory at home and is concerned about a right knee replacement she's had remotely. She denies any other pain or other complaints.   Past Medical History:  Diagnosis Date  . Asthma   . Benign paroxysmal vertigo   . COPD (chronic obstructive pulmonary disease) ([place])   . Depression   . Diabetes ([place])   . GERD (gastroesophageal reflux disease)   . Gout   . Hyperlipidemia   . Hypertension   . Hypothyroid   . Osteoporosis   . Peripheral neuropathy ([place])   . PVD (peripheral vascular disease) ([place])    blochage in Right leg -- had stent placement  . Renal artery stenosis ([place])   . Spinal stenosis   . Spinal stenosis   . Unspecified hereditary and idiopathic peripheral neuropathy     Patient Active Problem List   Diagnosis Date Noted  . Pulmonary hypertension 10/12/2016  . Acute respiratory failure with hypoxia ([place])   . Community acquired pneumonia of left upper lobe of lung ([place])   . CAP (community acquired pneumonia) 10/08/2016  . Shortness of breath 10/08/2016  . Sacral fracture, closed ([place]) 07/24/2016  . Chronic respiratory failure with hypoxia ([place]) 03/13/2015  . COPD bronchitis 10/20/2014  . Other specified hypothyroidism 04/01/2013  . HBP (high blood pressure) 03/19/2013    Past Surgical History:  Procedure Laterality Date  . ABDOMINAL HYSTERECTOMY    . APPENDECTOMY    . BACK SURGERY    . CATARACT EXTRACTION W/PHACO    . ILIAC  ARTERY STENT    . KIDNEY SURGERY     ballooned multiple times  . TONSILLECTOMY    . VEIN SURGERY     VEIN STRIPPED    Allergies Amlodipine; Avelox [moxifloxacin hcl in nacl]; Carafate [sucralfate]; Celecoxib; Diazepam; Doxazosin; Klonopin [clonazepam]; Levofloxacin; Norvasc [amlodipine besylate]; Pantoprazole sodium; Ranitidine; Rofecoxib; Shellfish allergy; Sulfa antibiotics; Sulfa drugs cross reactors; Codeine; and Tape  Social History Social History  Substance Use Topics  . Smoking status: Former Smoker    Packs/day: 0.25    Years: 20.00    Types: Cigarettes    Quit date: 10/03/1978  . Smokeless tobacco: Never Used  . Alcohol use No    Review of Systems Constitutional: Negative for fever. Cardiovascular: Negative for chest pain. Respiratory: Negative for shortness of breath. Gastrointestinal: Negative for abdominal pain, vomiting and diarrhea. Genitourinary: Negative for dysuria. Musculoskeletal: Positive for right knee pain Skin: Positive for right knee laceration Neurological: Negative for headaches, focal weakness or numbness.  10-point ROS otherwise negative.  ____________________________________________   PHYSICAL EXAM:  VITAL SIGNS: ED Triage Vitals  Enc Vitals Group     BP      Pulse      Resp      Temp      Temp src      SpO2      Weight      Height      Head Circumference      Peak Flow  Pain Score      Pain Loc      Pain Edu?      Excl. in [place]?     Constitutional: Alert and oriented. Well appearing and in no distress. Eyes: Conjunctivae are normal. PERRL. Normal extraocular movements. Cardiovascular: Normal rate, regular rhythm. No murmurs, rubs, or gallops. Respiratory: Normal respiratory effort without tachypnea nor retractions. Breath sounds are clear and equal bilaterally. No wheezes/rales/rhonchi. Gastrointestinal: Soft and nontender. Normal bowel sounds Musculoskeletal: Large laceration noted obliquely over the right patella. Pain  with range of motion of the right knee. Neurologic:  Normal neurologic exam, no gross deficits are noted. She is alert and oriented Skin:  8 cm laceration noted over the right patella Psychiatric: Mood and affect are normal. Speech and behavior are normal.  ____________________________________________  ED COURSE:  Pertinent labs & imaging results that were available during my care of the patient were reviewed by me and considered in my medical decision making (see chart for details). Patient presents for fall with right knee injury and laceration, we will assess with labs and imaging as indicated.   Marland Kitchen.Laceration Repair Date/Time: 01/09/2017 11:19 AM Performed by: Earleen Newport Authorized by: Lenise Arena E   Consent:    Consent obtained:  Verbal   Consent given by:  Patient Anesthesia (see MAR for exact dosages):    Anesthesia method:  Local infiltration   Local anesthetic:  Lidocaine 1% w/o epi Laceration details:    Location:  Leg   Leg location:  R knee   Length (cm):  8   Depth (mm):  5 Repair type:    Repair type:  Simple Exploration:    Contaminated: no   Treatment:    Area cleansed with:  Betadine   Amount of cleaning:  Standard   Irrigation solution:  Sterile saline   Irrigation method:  Syringe Skin repair:    Repair method:  Staples   Number of staples:  9 Approximation:    Approximation:  Close   Vermilion border: well-aligned   Post-procedure details:    Dressing:  Non-adherent dressing   Patient tolerance of procedure:  Tolerated well, no immediate complications   ____________________________________________   RADIOLOGY Images were viewed by me  Right knee x-rays IMPRESSION: Total knee replacement prosthetic components appear well seated. No fracture or joint effusion. No dislocation. There is atherosclerotic calcification in the distal superficial femoral artery and  popliteal artery. ____________________________________________  FINAL ASSESSMENT AND PLAN  Fall, right knee contusion, right knee laceration  Plan: Patient's imaging were dictated above. Patient had presented for a mechanical fall and right knee laceration. Patient has undergone laceration repair as dictated above. X-rays are negative. She is stable for discharge with staple removal in 2 weeks.   Earleen Newport, MD   Note: This note was generated in part or whole with voice recognition software. Voice recognition is usually quite accurate but there are transcription errors that can and very often do occur. I apologize for any typographical errors that were not detected and corrected.     Earleen Newport, MD 01/09/17 1120

## 2017-01-09 NOTE — ED Triage Notes (Signed)
Patient presents to the ED via EMS from home with a large laceration to her right knee. Patient reports she tripped over her oxygen cord and fell on her knee.  Patient denies losing consciousness or hitting her head.  Patient has history of right knee replacement.  Laceration to knee is several inches long.  Patient states when she is not moving she is not having any pain.  Patient is alert and oriented x 4.

## 2017-01-23 DIAGNOSIS — I4891 Unspecified atrial fibrillation: Secondary | ICD-10-CM | POA: Diagnosis not present

## 2017-01-23 DIAGNOSIS — I10 Essential (primary) hypertension: Secondary | ICD-10-CM | POA: Diagnosis not present

## 2017-01-23 DIAGNOSIS — E7800 Pure hypercholesterolemia, unspecified: Secondary | ICD-10-CM | POA: Diagnosis not present

## 2017-01-23 DIAGNOSIS — F418 Other specified anxiety disorders: Secondary | ICD-10-CM | POA: Diagnosis not present

## 2017-01-23 DIAGNOSIS — I519 Heart disease, unspecified: Secondary | ICD-10-CM | POA: Diagnosis not present

## 2017-01-23 DIAGNOSIS — E119 Type 2 diabetes mellitus without complications: Secondary | ICD-10-CM | POA: Diagnosis not present

## 2017-01-23 DIAGNOSIS — Z79899 Other long term (current) drug therapy: Secondary | ICD-10-CM | POA: Diagnosis not present

## 2017-01-23 DIAGNOSIS — E039 Hypothyroidism, unspecified: Secondary | ICD-10-CM | POA: Diagnosis not present

## 2017-01-28 ENCOUNTER — Emergency Department: Payer: Medicare Other

## 2017-01-28 ENCOUNTER — Inpatient Hospital Stay
Admission: EM | Admit: 2017-01-28 | Discharge: 2017-03-03 | DRG: 388 | Disposition: E | Payer: Medicare Other | Attending: Internal Medicine | Admitting: Internal Medicine

## 2017-01-28 ENCOUNTER — Encounter: Payer: Self-pay | Admitting: Radiology

## 2017-01-28 DIAGNOSIS — E871 Hypo-osmolality and hyponatremia: Secondary | ICD-10-CM | POA: Diagnosis not present

## 2017-01-28 DIAGNOSIS — F329 Major depressive disorder, single episode, unspecified: Secondary | ICD-10-CM | POA: Diagnosis present

## 2017-01-28 DIAGNOSIS — K529 Noninfective gastroenteritis and colitis, unspecified: Secondary | ICD-10-CM

## 2017-01-28 DIAGNOSIS — E1151 Type 2 diabetes mellitus with diabetic peripheral angiopathy without gangrene: Secondary | ICD-10-CM | POA: Diagnosis present

## 2017-01-28 DIAGNOSIS — R000 Tachycardia, unspecified: Secondary | ICD-10-CM

## 2017-01-28 DIAGNOSIS — K219 Gastro-esophageal reflux disease without esophagitis: Secondary | ICD-10-CM | POA: Diagnosis present

## 2017-01-28 DIAGNOSIS — M4800 Spinal stenosis, site unspecified: Secondary | ICD-10-CM | POA: Diagnosis present

## 2017-01-28 DIAGNOSIS — I110 Hypertensive heart disease with heart failure: Secondary | ICD-10-CM | POA: Diagnosis present

## 2017-01-28 DIAGNOSIS — Z91013 Allergy to seafood: Secondary | ICD-10-CM

## 2017-01-28 DIAGNOSIS — Z9849 Cataract extraction status, unspecified eye: Secondary | ICD-10-CM

## 2017-01-28 DIAGNOSIS — Z87891 Personal history of nicotine dependence: Secondary | ICD-10-CM

## 2017-01-28 DIAGNOSIS — K560 Paralytic ileus: Principal | ICD-10-CM | POA: Diagnosis present

## 2017-01-28 DIAGNOSIS — K5900 Constipation, unspecified: Secondary | ICD-10-CM

## 2017-01-28 DIAGNOSIS — J690 Pneumonitis due to inhalation of food and vomit: Secondary | ICD-10-CM | POA: Diagnosis not present

## 2017-01-28 DIAGNOSIS — Z841 Family history of disorders of kidney and ureter: Secondary | ICD-10-CM

## 2017-01-28 DIAGNOSIS — I4891 Unspecified atrial fibrillation: Secondary | ICD-10-CM | POA: Diagnosis present

## 2017-01-28 DIAGNOSIS — R0603 Acute respiratory distress: Secondary | ICD-10-CM | POA: Diagnosis not present

## 2017-01-28 DIAGNOSIS — M810 Age-related osteoporosis without current pathological fracture: Secondary | ICD-10-CM | POA: Diagnosis present

## 2017-01-28 DIAGNOSIS — Z882 Allergy status to sulfonamides status: Secondary | ICD-10-CM

## 2017-01-28 DIAGNOSIS — R339 Retention of urine, unspecified: Secondary | ICD-10-CM | POA: Diagnosis not present

## 2017-01-28 DIAGNOSIS — Z7951 Long term (current) use of inhaled steroids: Secondary | ICD-10-CM

## 2017-01-28 DIAGNOSIS — Z808 Family history of malignant neoplasm of other organs or systems: Secondary | ICD-10-CM

## 2017-01-28 DIAGNOSIS — J811 Chronic pulmonary edema: Secondary | ICD-10-CM

## 2017-01-28 DIAGNOSIS — E039 Hypothyroidism, unspecified: Secondary | ICD-10-CM | POA: Diagnosis present

## 2017-01-28 DIAGNOSIS — Z66 Do not resuscitate: Secondary | ICD-10-CM | POA: Diagnosis present

## 2017-01-28 DIAGNOSIS — K567 Ileus, unspecified: Secondary | ICD-10-CM | POA: Diagnosis not present

## 2017-01-28 DIAGNOSIS — Z0189 Encounter for other specified special examinations: Secondary | ICD-10-CM

## 2017-01-28 DIAGNOSIS — E876 Hypokalemia: Secondary | ICD-10-CM | POA: Diagnosis present

## 2017-01-28 DIAGNOSIS — J449 Chronic obstructive pulmonary disease, unspecified: Secondary | ICD-10-CM | POA: Diagnosis present

## 2017-01-28 DIAGNOSIS — I5033 Acute on chronic diastolic (congestive) heart failure: Secondary | ICD-10-CM | POA: Diagnosis present

## 2017-01-28 DIAGNOSIS — Z90710 Acquired absence of both cervix and uterus: Secondary | ICD-10-CM

## 2017-01-28 DIAGNOSIS — E785 Hyperlipidemia, unspecified: Secondary | ICD-10-CM | POA: Diagnosis present

## 2017-01-28 DIAGNOSIS — R109 Unspecified abdominal pain: Secondary | ICD-10-CM | POA: Diagnosis not present

## 2017-01-28 DIAGNOSIS — Z8041 Family history of malignant neoplasm of ovary: Secondary | ICD-10-CM

## 2017-01-28 DIAGNOSIS — Z888 Allergy status to other drugs, medicaments and biological substances status: Secondary | ICD-10-CM

## 2017-01-28 DIAGNOSIS — R1030 Lower abdominal pain, unspecified: Secondary | ICD-10-CM | POA: Diagnosis not present

## 2017-01-28 DIAGNOSIS — Z7982 Long term (current) use of aspirin: Secondary | ICD-10-CM

## 2017-01-28 DIAGNOSIS — Z885 Allergy status to narcotic agent status: Secondary | ICD-10-CM

## 2017-01-28 DIAGNOSIS — Z91048 Other nonmedicinal substance allergy status: Secondary | ICD-10-CM

## 2017-01-28 DIAGNOSIS — Z8249 Family history of ischemic heart disease and other diseases of the circulatory system: Secondary | ICD-10-CM

## 2017-01-28 DIAGNOSIS — Z881 Allergy status to other antibiotic agents status: Secondary | ICD-10-CM

## 2017-01-28 DIAGNOSIS — E1142 Type 2 diabetes mellitus with diabetic polyneuropathy: Secondary | ICD-10-CM | POA: Diagnosis present

## 2017-01-28 DIAGNOSIS — Z961 Presence of intraocular lens: Secondary | ICD-10-CM | POA: Diagnosis present

## 2017-01-28 HISTORY — DX: Heart failure, unspecified: I50.9

## 2017-01-28 LAB — URINALYSIS, COMPLETE (UACMP) WITH MICROSCOPIC
Bilirubin Urine: NEGATIVE
Glucose, UA: NEGATIVE mg/dL
HGB URINE DIPSTICK: NEGATIVE
Ketones, ur: 5 mg/dL — AB
LEUKOCYTES UA: NEGATIVE
NITRITE: NEGATIVE
PH: 5 (ref 5.0–8.0)
Protein, ur: 30 mg/dL — AB
SPECIFIC GRAVITY, URINE: 1.017 (ref 1.005–1.030)

## 2017-01-28 LAB — COMPREHENSIVE METABOLIC PANEL
ALK PHOS: 84 U/L (ref 38–126)
ALT: 10 U/L — ABNORMAL LOW (ref 14–54)
ANION GAP: 10 (ref 5–15)
AST: 21 U/L (ref 15–41)
Albumin: 2.6 g/dL — ABNORMAL LOW (ref 3.5–5.0)
BILIRUBIN TOTAL: 0.9 mg/dL (ref 0.3–1.2)
BUN: 24 mg/dL — ABNORMAL HIGH (ref 6–20)
CALCIUM: 7.5 mg/dL — AB (ref 8.9–10.3)
CO2: 22 mmol/L (ref 22–32)
Chloride: 102 mmol/L (ref 101–111)
Creatinine, Ser: 0.93 mg/dL (ref 0.44–1.00)
GFR, EST NON AFRICAN AMERICAN: 54 mL/min — AB (ref >60)
Glucose, Bld: 155 mg/dL — ABNORMAL HIGH (ref 65–99)
POTASSIUM: 3.2 mmol/L — AB (ref 3.5–5.1)
Sodium: 134 mmol/L — ABNORMAL LOW (ref 135–145)
TOTAL PROTEIN: 5.2 g/dL — AB (ref 6.5–8.1)

## 2017-01-28 LAB — CBC WITH DIFFERENTIAL/PLATELET
BASOS PCT: 0 %
Basophils Absolute: 0 10*3/uL (ref 0–0.1)
Eosinophils Absolute: 0 10*3/uL (ref 0–0.7)
Eosinophils Relative: 0 %
HEMATOCRIT: 36.9 % (ref 35.0–47.0)
HEMOGLOBIN: 12.5 g/dL (ref 12.0–16.0)
LYMPHS ABS: 0.7 10*3/uL — AB (ref 1.0–3.6)
LYMPHS PCT: 4 %
MCH: 30.4 pg (ref 26.0–34.0)
MCHC: 33.9 g/dL (ref 32.0–36.0)
MCV: 89.5 fL (ref 80.0–100.0)
MONO ABS: 2 10*3/uL — AB (ref 0.2–0.9)
MONOS PCT: 12 %
NEUTROS ABS: 14.3 10*3/uL — AB (ref 1.4–6.5)
Neutrophils Relative %: 84 %
Platelets: 261 10*3/uL (ref 150–440)
RBC: 4.13 MIL/uL (ref 3.80–5.20)
RDW: 13.5 % (ref 11.5–14.5)
WBC: 17.1 10*3/uL — ABNORMAL HIGH (ref 3.6–11.0)

## 2017-01-28 LAB — LIPASE, BLOOD: LIPASE: 11 U/L (ref 11–51)

## 2017-01-28 MED ORDER — SODIUM CHLORIDE 0.9 % IV BOLUS (SEPSIS)
1000.0000 mL | Freq: Once | INTRAVENOUS | Status: AC
  Administered 2017-01-28: 1000 mL via INTRAVENOUS

## 2017-01-28 MED ORDER — DILTIAZEM HCL ER COATED BEADS 180 MG PO CP24
180.0000 mg | ORAL_CAPSULE | Freq: Once | ORAL | Status: AC
  Administered 2017-01-29: 180 mg via ORAL
  Filled 2017-01-28: qty 1

## 2017-01-28 MED ORDER — IOPAMIDOL (ISOVUE-300) INJECTION 61%
30.0000 mL | Freq: Once | INTRAVENOUS | Status: AC | PRN
  Administered 2017-01-28: 30 mL via ORAL

## 2017-01-28 MED ORDER — IOPAMIDOL (ISOVUE-300) INJECTION 61%
80.0000 mL | Freq: Once | INTRAVENOUS | Status: AC | PRN
  Administered 2017-01-28: 80 mL via INTRAVENOUS

## 2017-01-28 NOTE — ED Provider Notes (Signed)
[facility] Emergency Department Provider Note  ____________________________________________  Time seen: Approximately 8:40 PM  I have reviewed the triage vital signs and the nursing notes.   HISTORY  Chief Complaint Abdominal Pain and Constipation   HPI Tracy Richards is a 83 y.o. female with a history of COPD, afib, diabetes, hypertension, hyperlipidemia, PVD, spinal stenosis and presents for evaluation of abdominal pain. According to EMS patient was seen in the emergency department and the beginning of the month for a fall and knee injury. Since then she has been less mobile due to pain in her knee and has been using opiates for pain control. Patient was constipated for a week and had a small bowel movement yesterday after an enema. Her abdomen is getting progressively more distended for the last week and today she started complaining of abdominal pain. She had 1 episode of nonbloody nonbilious emesis. No fevers. Patient has had prior abdominal surgeries including appendectomy and hysterectomy. No prior history of SBO. Patient is also complaining of dysuria for the last few days. No chest pain or shortness of breath.  Past Medical History:  Diagnosis Date  . Asthma   . Benign paroxysmal vertigo   . CHF (congestive heart failure) ([place])   . COPD (chronic obstructive pulmonary disease) ([place])   . Depression   . Diabetes ([place])   . GERD (gastroesophageal reflux disease)   . Gout   . Hyperlipidemia   . Hypertension   . Hypothyroid   . Osteoporosis   . Peripheral neuropathy   . PVD (peripheral vascular disease) ([place])    blochage in Right leg -- had stent placement  . Renal artery stenosis ([place])   . Spinal stenosis   . Spinal stenosis   . Unspecified hereditary and idiopathic peripheral neuropathy     Patient Active Problem List   Diagnosis Date Noted  . Colitis 01/29/2017  . Pulmonary hypertension ([place]) 10/12/2016  . Acute respiratory failure with  hypoxia ([place])   . Community acquired pneumonia of left upper lobe of lung ([place])   . CAP (community acquired pneumonia) 10/08/2016  . Shortness of breath 10/08/2016  . Sacral fracture, closed ([place]) 07/24/2016  . Chronic respiratory failure with hypoxia ([place]) 03/13/2015  . COPD bronchitis 10/20/2014  . Other specified hypothyroidism 04/01/2013  . HBP (high blood pressure) 03/19/2013    Past Surgical History:  Procedure Laterality Date  . ABDOMINAL HYSTERECTOMY    . APPENDECTOMY    . BACK SURGERY    . CATARACT EXTRACTION W/PHACO    . ILIAC ARTERY STENT    . KIDNEY SURGERY     ballooned multiple times  . TONSILLECTOMY    . VEIN SURGERY     VEIN STRIPPED    Prior to Admission medications   Medication Sig Start Date End Date Taking? Authorizing Provider  acetaminophen (TYLENOL) 500 MG tablet Take 2 tablets (1,000 mg total) by mouth 3 (three) times daily. Patient taking differently: Take 500 mg by mouth every 8 (eight) hours as needed for mild pain, fever or headache.  07/26/16 07/26/17 Yes Rudene Re, MD  albuterol (PROVENTIL) (2.5 MG/3ML) 0.083% nebulizer solution Inhale 3 mLs into the lungs every 6 (six) hours as needed for wheezing or shortness of breath.    Yes Historical Provider, MD  ALPRAZolam Duanne Moron) 0.5 MG tablet Take 0.5 mg by mouth 2 (two) times daily as needed for anxiety.   Yes Historical Provider, MD  aspirin EC 81 MG tablet Take 81 mg by mouth  daily.   Yes Historical Provider, MD  atorvastatin (LIPITOR) 40 MG tablet Take 40 mg by mouth at bedtime.    Yes Historical Provider, MD  diltiazem (DILACOR XR) 240 MG 24 hr capsule Take 240 mg by mouth daily.    Yes Historical Provider, MD  docusate sodium (COLACE) 100 MG capsule Take 100 mg by mouth 2 (two) times daily.   Yes Historical Provider, MD  feeding supplement, GLUCERNA SHAKE, (GLUCERNA SHAKE) LIQD Take 237 mLs by mouth 2 (two) times daily between meals. 10/12/16  Yes Mercy Riding, MD  fluticasone (FLONASE) 50  MCG/ACT nasal spray Place 2 sprays into both nostrils daily.   Yes Historical Provider, MD  Fluticasone-Salmeterol (ADVAIR DISKUS) 250-50 MCG/DOSE AEPB Inhale 1 puff into the lungs 2 (two) times daily. 03/16/15  Yes Tanda Rockers, MD  gabapentin (NEURONTIN) 600 MG tablet Take 1 tablet (600 mg total) by mouth 2 (two) times daily. 10/12/16  Yes Mercy Riding, MD  levothyroxine (SYNTHROID, LEVOTHROID) 100 MCG tablet Take 100 mcg by mouth daily. 10/19/14  Yes Historical Provider, MD  nitroGLYCERIN (NITROSTAT) 0.4 MG SL tablet Place 0.4 mg under the tongue every 5 (five) minutes as needed.   Yes Historical Provider, MD  nystatin (MYCOSTATIN) 100000 UNIT/ML suspension Take 5 mLs by mouth 4 (four) times daily.   Yes Historical Provider, MD  pantoprazole (PROTONIX) 40 MG tablet Take 40 mg by mouth daily.   Yes Historical Provider, MD  torsemide (DEMADEX) 20 MG tablet Take 20-40 mg by mouth daily as needed for fluid.   Yes Historical Provider, MD  triamcinolone cream (KENALOG) 0.1 % Apply 1 application topically 2 (two) times daily.   Yes Historical Provider, MD    Allergies Amlodipine; Avelox [moxifloxacin hcl in nacl]; Carafate [sucralfate]; Celecoxib; Diazepam; Doxazosin; Klonopin [clonazepam]; Levofloxacin; Norvasc [amlodipine besylate]; Pantoprazole sodium; Ranitidine; Rofecoxib; Shellfish allergy; Sulfa antibiotics; Sulfa drugs cross reactors; Codeine; and Tape  Family History  Problem Relation Age of Onset  . Sudden death Father     unknown causes  . Hypertension Mother   . Sudden death Mother     unknown causes  . Kidney disease Brother     Brights disease  . Bone cancer Brother   . Ovarian cancer Daughter     Social History Social History  Substance Use Topics  . Smoking status: Former Smoker    Packs/day: 0.25    Years: 20.00    Types: Cigarettes    Quit date: 10/03/1978  . Smokeless tobacco: Never Used  . Alcohol use No    Review of Systems  Constitutional: Negative for  fever. Eyes: Negative for visual changes. ENT: Negative for sore throat. Neck: No neck pain  Cardiovascular: Negative for chest pain. Respiratory: Negative for shortness of breath. Gastrointestinal: + abdominal distention and pain, constipation, nausea, vomiting. No diarrhea. Genitourinary: + dysuria. Musculoskeletal: Negative for back pain. Skin: Negative for rash. Neurological: Negative for headaches, weakness or numbness. Psych: No SI or HI  ____________________________________________   PHYSICAL EXAM:  VITAL SIGNS: ED Triage Vitals  Enc Vitals Group     BP 01/06/2017 2032 96/66     Pulse Rate 01/24/2017 2032 (!) 102     Resp 01/10/2017 2032 (!) 22     Temp 01/04/2017 2032 98.7 F (37.1 C)     Temp Source 01/12/2017 2032 Oral     SpO2 01/02/2017 2032 95 %     Weight 01/12/2017 2033 140 lb (63.5 kg)     Height 01/29/2017 2033 5' (  1.524 m)     Head Circumference --      Peak Flow --      Pain Score 01/30/2017 2022 6     Pain Loc --      Pain Edu? --      Excl. in [place]? --     Constitutional: Alert and oriented. Well appearing and in no apparent distress. HEENT:      Head: Normocephalic and atraumatic.         Eyes: Conjunctivae are normal. Sclera is non-icteric. EOMI. PERRL      Mouth/Throat: Mucous membranes are moist.       Neck: Supple with no signs of meningismus. Cardiovascular: Tachycardic with irregularly irregular rhythm. No murmurs, gallops, or rubs. 2+ symmetrical distal pulses are present in all extremities. No JVD. Respiratory: Normal respiratory effort. Lungs are clear to auscultation bilaterally. No wheezes, crackles, or rhonchi.  Gastrointestinal: Significantly distended abdomen with tenderness to palpation throughout, no rebound or guarding, absent bowel sounds  Musculoskeletal: Well healing laceration of the right knee with no warmth or erythema or swelling. Nontender with normal range of motion in all extremities. No edema, cyanosis, or erythema of  extremities. Neurologic: Normal speech and language. Face is symmetric. Moving all extremities. No gross focal neurologic deficits are appreciated. Skin: Skin is warm, dry and intact. No rash noted. Psychiatric: Mood and affect are normal. Speech and behavior are normal.  ____________________________________________   LABS (all labs ordered are listed, but only abnormal results are displayed)  Labs Reviewed  URINE CULTURE - Abnormal; Notable for the following:       Result Value   Culture MULTIPLE SPECIES PRESENT, SUGGEST RECOLLECTION (*)    All other components within normal limits  CBC WITH DIFFERENTIAL/PLATELET - Abnormal; Notable for the following:    WBC 17.1 (*)    Neutro Abs 14.3 (*)    Lymphs Abs 0.7 (*)    Monocytes Absolute 2.0 (*)    All other components within normal limits  COMPREHENSIVE METABOLIC PANEL - Abnormal; Notable for the following:    Sodium 134 (*)    Potassium 3.2 (*)    Glucose, Bld 155 (*)    BUN 24 (*)    Calcium 7.5 (*)    Total Protein 5.2 (*)    Albumin 2.6 (*)    ALT 10 (*)    GFR calc non Af Amer 54 (*)    All other components within normal limits  URINALYSIS, COMPLETE (UACMP) WITH MICROSCOPIC - Abnormal; Notable for the following:    Color, Urine AMBER (*)    APPearance HAZY (*)    Ketones, ur 5 (*)    Protein, ur 30 (*)    Bacteria, UA RARE (*)    Squamous Epithelial / LPF 6-30 (*)    All other components within normal limits  BASIC METABOLIC PANEL - Abnormal; Notable for the following:    Potassium 3.4 (*)    Glucose, Bld 123 (*)    BUN 24 (*)    Calcium 7.7 (*)    All other components within normal limits  CBC - Abnormal; Notable for the following:    WBC 18.7 (*)    RBC 3.78 (*)    Hemoglobin 11.1 (*)    HCT 33.5 (*)    All other components within normal limits  CBC - Abnormal; Notable for the following:    WBC 12.7 (*)    RBC 3.66 (*)    Hemoglobin 11.0 (*)    HCT  33.2 (*)    All other components within normal limits   BASIC METABOLIC PANEL - Abnormal; Notable for the following:    Chloride 112 (*)    Calcium 7.8 (*)    All other components within normal limits  PROTIME-INR - Abnormal; Notable for the following:    Prothrombin Time 15.3 (*)    All other components within normal limits  APTT - Abnormal; Notable for the following:    aPTT 37 (*)    All other components within normal limits  CBC - Abnormal; Notable for the following:    WBC 11.1 (*)    Hemoglobin 11.5 (*)    HCT 34.5 (*)    All other components within normal limits  BASIC METABOLIC PANEL - Abnormal; Notable for the following:    Sodium 146 (*)    Potassium 3.3 (*)    Chloride 112 (*)    Glucose, Bld 124 (*)    Calcium 8.0 (*)    All other components within normal limits  LIPASE, BLOOD   ____________________________________________  EKG  ED ECG REPORT I, Rudene Re, the attending physician, personally viewed and interpreted this ECG.  Atrial fibrillation, rate of 104, normal QRS and QTc intervals, normal axis, no ST elevations or depressions, T-wave on anterior leads. Unchanged from prior from January 2018  ____________________________________________  RADIOLOGY  KUB: Negative  CT a/p: PND ____________________________________________   PROCEDURES  Procedure(s) performed: None Procedures Critical Care performed:  None ____________________________________________   INITIAL IMPRESSION / ASSESSMENT AND PLAN / ED COURSE  83 y.o. female with a history of COPD, afib, diabetes, hypertension, hyperlipidemia, PVD, spinal stenosis and presents for evaluation of abdominal pain, distention, constipation, nausea and vomiting in the setting of opiate use for knee pain. Also complaining of dysuria. Patient is afebrile, she is hypotensive with blood pressure 96/66, she is in A. fib with rate in the low 100s, her abdomen severely distended with diffuse tenderness, no rebound or guarding, absent bowel sounds. Differential  diagnoses including ileus in the setting of opiates versus small bowel obstruction versus volvulus. We'll start with IV fluids, lab work, KUB. We'll proceed with a CT abdomen and pelvis was kidney function is resulted.  Clinical Course as of Feb 01 1099  Sun Jan 29, 2017  0040 1. Diffuse mild transmural thickening of large bowel with fecal retention consistent with an inflammatory colitis secondary to retained stool (stercoral colitis). No mechanical bowel obstruction. Mild small bowel ileus or dysmotility accounting for mild distention with contrast. 2. Aortoiliac atherosclerosis. No aneurysm. 3. Stable 8 mm interpolar left renal cyst. 4. Tiny 3 mm right hepatic hypodensity too small to further characterize but statistically consistent with a cyst or hemangioma. 5. Lumbar spondylosis; chronic right sacral alar and left parasymphyseal fractures.   CT Abdomen Pelvis W Contrast [AW]    Clinical Course User Index [AW] Loney Hering, MD    Pertinent labs & imaging results that were available during my care of the patient were reviewed by me and considered in my medical decision making (see chart for details).    ____________________________________________   FINAL CLINICAL IMPRESSION(S) / ED DIAGNOSES  Final diagnoses:  Constipation, unspecified constipation type  Colitis  Ileus ([place])  Tachycardia      NEW MEDICATIONS STARTED DURING THIS VISIT:  Current Discharge Medication List       Note:  This document was prepared using Dragon voice recognition software and may include unintentional dictation errors.    Rudene Re, MD 02/01/17 1100

## 2017-01-28 NOTE — ED Triage Notes (Signed)
abd pain and constipation x 1 week - enema yesterday with some results. Hypo bowel sounds

## 2017-01-29 ENCOUNTER — Encounter: Payer: Self-pay | Admitting: Internal Medicine

## 2017-01-29 ENCOUNTER — Inpatient Hospital Stay: Payer: Medicare Other

## 2017-01-29 DIAGNOSIS — K56699 Other intestinal obstruction unspecified as to partial versus complete obstruction: Secondary | ICD-10-CM | POA: Diagnosis not present

## 2017-01-29 DIAGNOSIS — Z885 Allergy status to narcotic agent status: Secondary | ICD-10-CM | POA: Diagnosis not present

## 2017-01-29 DIAGNOSIS — Z882 Allergy status to sulfonamides status: Secondary | ICD-10-CM | POA: Diagnosis not present

## 2017-01-29 DIAGNOSIS — I5033 Acute on chronic diastolic (congestive) heart failure: Secondary | ICD-10-CM | POA: Diagnosis present

## 2017-01-29 DIAGNOSIS — E785 Hyperlipidemia, unspecified: Secondary | ICD-10-CM | POA: Diagnosis present

## 2017-01-29 DIAGNOSIS — E876 Hypokalemia: Secondary | ICD-10-CM | POA: Diagnosis not present

## 2017-01-29 DIAGNOSIS — I110 Hypertensive heart disease with heart failure: Secondary | ICD-10-CM | POA: Diagnosis present

## 2017-01-29 DIAGNOSIS — K219 Gastro-esophageal reflux disease without esophagitis: Secondary | ICD-10-CM | POA: Diagnosis present

## 2017-01-29 DIAGNOSIS — R1030 Lower abdominal pain, unspecified: Secondary | ICD-10-CM | POA: Diagnosis not present

## 2017-01-29 DIAGNOSIS — K529 Noninfective gastroenteritis and colitis, unspecified: Secondary | ICD-10-CM | POA: Diagnosis present

## 2017-01-29 DIAGNOSIS — M4800 Spinal stenosis, site unspecified: Secondary | ICD-10-CM | POA: Diagnosis present

## 2017-01-29 DIAGNOSIS — E871 Hypo-osmolality and hyponatremia: Secondary | ICD-10-CM | POA: Diagnosis present

## 2017-01-29 DIAGNOSIS — J90 Pleural effusion, not elsewhere classified: Secondary | ICD-10-CM | POA: Diagnosis not present

## 2017-01-29 DIAGNOSIS — R0603 Acute respiratory distress: Secondary | ICD-10-CM | POA: Diagnosis not present

## 2017-01-29 DIAGNOSIS — J690 Pneumonitis due to inhalation of food and vomit: Secondary | ICD-10-CM | POA: Diagnosis not present

## 2017-01-29 DIAGNOSIS — Z4682 Encounter for fitting and adjustment of non-vascular catheter: Secondary | ICD-10-CM | POA: Diagnosis not present

## 2017-01-29 DIAGNOSIS — E039 Hypothyroidism, unspecified: Secondary | ICD-10-CM | POA: Diagnosis present

## 2017-01-29 DIAGNOSIS — Z7951 Long term (current) use of inhaled steroids: Secondary | ICD-10-CM | POA: Diagnosis not present

## 2017-01-29 DIAGNOSIS — E1142 Type 2 diabetes mellitus with diabetic polyneuropathy: Secondary | ICD-10-CM | POA: Diagnosis present

## 2017-01-29 DIAGNOSIS — F329 Major depressive disorder, single episode, unspecified: Secondary | ICD-10-CM | POA: Diagnosis present

## 2017-01-29 DIAGNOSIS — I4891 Unspecified atrial fibrillation: Secondary | ICD-10-CM | POA: Diagnosis present

## 2017-01-29 DIAGNOSIS — K567 Ileus, unspecified: Secondary | ICD-10-CM | POA: Diagnosis not present

## 2017-01-29 DIAGNOSIS — E1151 Type 2 diabetes mellitus with diabetic peripheral angiopathy without gangrene: Secondary | ICD-10-CM | POA: Diagnosis present

## 2017-01-29 DIAGNOSIS — J449 Chronic obstructive pulmonary disease, unspecified: Secondary | ICD-10-CM | POA: Diagnosis present

## 2017-01-29 DIAGNOSIS — Z7982 Long term (current) use of aspirin: Secondary | ICD-10-CM | POA: Diagnosis not present

## 2017-01-29 DIAGNOSIS — Z888 Allergy status to other drugs, medicaments and biological substances status: Secondary | ICD-10-CM | POA: Diagnosis not present

## 2017-01-29 DIAGNOSIS — Z0389 Encounter for observation for other suspected diseases and conditions ruled out: Secondary | ICD-10-CM | POA: Diagnosis not present

## 2017-01-29 DIAGNOSIS — M810 Age-related osteoporosis without current pathological fracture: Secondary | ICD-10-CM | POA: Diagnosis present

## 2017-01-29 DIAGNOSIS — K560 Paralytic ileus: Secondary | ICD-10-CM | POA: Diagnosis present

## 2017-01-29 DIAGNOSIS — R109 Unspecified abdominal pain: Secondary | ICD-10-CM | POA: Diagnosis present

## 2017-01-29 DIAGNOSIS — Z881 Allergy status to other antibiotic agents status: Secondary | ICD-10-CM | POA: Diagnosis not present

## 2017-01-29 DIAGNOSIS — Z91013 Allergy to seafood: Secondary | ICD-10-CM | POA: Diagnosis not present

## 2017-01-29 LAB — BASIC METABOLIC PANEL
Anion gap: 8 (ref 5–15)
BUN: 24 mg/dL — AB (ref 6–20)
CHLORIDE: 106 mmol/L (ref 101–111)
CO2: 24 mmol/L (ref 22–32)
CREATININE: 0.84 mg/dL (ref 0.44–1.00)
Calcium: 7.7 mg/dL — ABNORMAL LOW (ref 8.9–10.3)
GFR calc Af Amer: >60 mL/min (ref >60)
GFR calc non Af Amer: >60 mL/min (ref >60)
GLUCOSE: 123 mg/dL — AB (ref 65–99)
Potassium: 3.4 mmol/L — ABNORMAL LOW (ref 3.5–5.1)
Sodium: 138 mmol/L (ref 135–145)

## 2017-01-29 LAB — CBC
HCT: 33.5 % — ABNORMAL LOW (ref 35.0–47.0)
Hemoglobin: 11.1 g/dL — ABNORMAL LOW (ref 12.0–16.0)
MCH: 29.3 pg (ref 26.0–34.0)
MCHC: 33.1 g/dL (ref 32.0–36.0)
MCV: 88.8 fL (ref 80.0–100.0)
PLATELETS: 237 10*3/uL (ref 150–440)
RBC: 3.78 MIL/uL — AB (ref 3.80–5.20)
RDW: 13.9 % (ref 11.5–14.5)
WBC: 18.7 10*3/uL — ABNORMAL HIGH (ref 3.6–11.0)

## 2017-01-29 MED ORDER — ATORVASTATIN CALCIUM 20 MG PO TABS
40.0000 mg | ORAL_TABLET | Freq: Every day | ORAL | Status: DC
  Administered 2017-01-29 – 2017-02-02 (×5): 40 mg via ORAL
  Filled 2017-01-29 (×6): qty 2

## 2017-01-29 MED ORDER — MORPHINE SULFATE (PF) 2 MG/ML IV SOLN
2.0000 mg | INTRAVENOUS | Status: DC | PRN
  Administered 2017-01-29 – 2017-02-04 (×4): 2 mg via INTRAVENOUS
  Filled 2017-01-29 (×4): qty 1

## 2017-01-29 MED ORDER — ONDANSETRON HCL 4 MG/2ML IJ SOLN
4.0000 mg | Freq: Once | INTRAMUSCULAR | Status: AC
  Administered 2017-01-29: 4 mg via INTRAVENOUS
  Filled 2017-01-29: qty 2

## 2017-01-29 MED ORDER — ACETAMINOPHEN 325 MG PO TABS
650.0000 mg | ORAL_TABLET | Freq: Four times a day (QID) | ORAL | Status: DC | PRN
  Administered 2017-01-29: 650 mg via ORAL
  Filled 2017-01-29: qty 2

## 2017-01-29 MED ORDER — METRONIDAZOLE IN NACL 5-0.79 MG/ML-% IV SOLN
500.0000 mg | Freq: Three times a day (TID) | INTRAVENOUS | Status: DC
  Administered 2017-01-29: 500 mg via INTRAVENOUS
  Filled 2017-01-29 (×3): qty 100

## 2017-01-29 MED ORDER — SODIUM CHLORIDE 0.9 % IV BOLUS (SEPSIS)
500.0000 mL | Freq: Once | INTRAVENOUS | Status: AC
  Administered 2017-01-29: 500 mL via INTRAVENOUS

## 2017-01-29 MED ORDER — ONDANSETRON HCL 4 MG/2ML IJ SOLN
4.0000 mg | Freq: Four times a day (QID) | INTRAMUSCULAR | Status: DC | PRN
  Administered 2017-02-01 – 2017-02-04 (×5): 4 mg via INTRAVENOUS
  Filled 2017-01-29 (×5): qty 2

## 2017-01-29 MED ORDER — POTASSIUM CHLORIDE IN NACL 20-0.9 MEQ/L-% IV SOLN
INTRAVENOUS | Status: DC
  Administered 2017-01-29: 17:00:00 via INTRAVENOUS
  Administered 2017-01-29: 100 mL/h via INTRAVENOUS
  Filled 2017-01-29 (×5): qty 1000

## 2017-01-29 MED ORDER — ENOXAPARIN SODIUM 40 MG/0.4ML ~~LOC~~ SOLN
40.0000 mg | SUBCUTANEOUS | Status: DC
  Administered 2017-01-29 – 2017-02-04 (×7): 40 mg via SUBCUTANEOUS
  Filled 2017-01-29 (×6): qty 0.4

## 2017-01-29 MED ORDER — ACETAMINOPHEN 650 MG RE SUPP: 650.0000 mg | Freq: Four times a day (QID) | RECTAL | Status: DC | PRN

## 2017-01-29 MED ORDER — METRONIDAZOLE IN NACL 5-0.79 MG/ML-% IV SOLN
500.0000 mg | Freq: Once | INTRAVENOUS | Status: AC
  Administered 2017-01-29: 500 mg via INTRAVENOUS
  Filled 2017-01-29: qty 100

## 2017-01-29 MED ORDER — ONDANSETRON HCL 4 MG PO TABS: 4.0000 mg | ORAL_TABLET | Freq: Four times a day (QID) | ORAL | Status: DC | PRN

## 2017-01-29 MED ORDER — PIPERACILLIN-TAZOBACTAM 3.375 G IVPB
3.3750 g | Freq: Three times a day (TID) | INTRAVENOUS | Status: DC
  Administered 2017-01-29 – 2017-02-04 (×16): 3.375 g via INTRAVENOUS
  Filled 2017-01-29 (×22): qty 50

## 2017-01-29 MED ORDER — PIPERACILLIN-TAZOBACTAM 3.375 G IVPB 30 MIN: 3.3750 g | Freq: Three times a day (TID) | INTRAVENOUS | Status: DC

## 2017-01-29 MED ORDER — SODIUM CHLORIDE 0.9% FLUSH
3.0000 mL | Freq: Two times a day (BID) | INTRAVENOUS | Status: DC
  Administered 2017-01-29 – 2017-02-02 (×7): 3 mL via INTRAVENOUS

## 2017-01-29 MED ORDER — MORPHINE SULFATE (PF) 2 MG/ML IV SOLN
2.0000 mg | Freq: Once | INTRAVENOUS | Status: AC
  Administered 2017-01-29: 2 mg via INTRAVENOUS
  Filled 2017-01-29: qty 1

## 2017-01-29 MED ORDER — GABAPENTIN 600 MG PO TABS
600.0000 mg | ORAL_TABLET | Freq: Two times a day (BID) | ORAL | Status: DC
  Administered 2017-01-29 – 2017-02-03 (×9): 600 mg via ORAL
  Filled 2017-01-29 (×11): qty 1

## 2017-01-29 MED ORDER — ALBUTEROL SULFATE (2.5 MG/3ML) 0.083% IN NEBU
3.0000 mL | INHALATION_SOLUTION | Freq: Four times a day (QID) | RESPIRATORY_TRACT | Status: DC | PRN
  Administered 2017-01-30: 3 mL via RESPIRATORY_TRACT
  Filled 2017-01-29: qty 3

## 2017-01-29 MED ORDER — BISACODYL 10 MG RE SUPP
10.0000 mg | Freq: Once | RECTAL | Status: AC
  Administered 2017-01-29: 10 mg via RECTAL
  Filled 2017-01-29: qty 1

## 2017-01-29 NOTE — Progress Notes (Signed)
[facility] NAME: Tracy Richards    MR#:  [id number]  DATE OF BIRTH:  09/29/1929  SUBJECTIVE:   Patient here due to abdominal pain nausea vomiting and noted to have an ileus and also suspected Constipated and colitis due to retained stool.  Still having some Abd. Pain. s/p NG tube but no drainage noted.  Daughter at bedside.   REVIEW OF SYSTEMS:    Review of Systems  Constitutional: Negative for chills and fever.  HENT: Negative for congestion and tinnitus.   Eyes: Negative for blurred vision and double vision.  Respiratory: Negative for cough, shortness of breath and wheezing.   Cardiovascular: Negative for chest pain, orthopnea and PND.  Gastrointestinal: Negative for diarrhea, nausea and vomiting.  Genitourinary: Negative for dysuria and hematuria.  Neurological: Negative for dizziness, sensory change and focal weakness.  All other systems reviewed and are negative.   Nutrition: NPO Tolerating Diet: No Tolerating PT: Await Eval.    DRUG ALLERGIES:   Allergies  Allergen Reactions  . Amlodipine Swelling  . Avelox [Moxifloxacin Hcl In Nacl] Other (See Comments)    G.I. Upset   . Carafate [Sucralfate] Itching    Ulcers also   . Celecoxib Other (See Comments)    Reaction unknown  . Diazepam Hives  . Doxazosin Other (See Comments)    Reaction unknown  . Klonopin [Clonazepam] Hives  . Levofloxacin Cough    Choking Sensation  . Norvasc [Amlodipine Besylate] Swelling  . Pantoprazole Sodium Itching  . Ranitidine Itching  . Rofecoxib Other (See Comments)    Reaction unknown  . Shellfish Allergy Diarrhea  . Sulfa Antibiotics Other (See Comments)    Mouth Sores  . Sulfa Drugs Cross Reactors Other (See Comments)    Mouth Sores  . Codeine Anxiety    "Makes me crazy"  . Tape Rash and Other (See Comments)    SKIN IS VERY THIN; TEARS AND BRUISES VERY EASILY!!    VITALS:  Blood pressure (!) 85/55, pulse 86, temperature  98.9 F (37.2 C), temperature source Oral, resp. rate 18, height 5' (1.524 m), weight 61 kg (134 lb 6.4 oz), SpO2 91 %.  PHYSICAL EXAMINATION:   Physical Exam  GENERAL:  83 y.o.-year-old patient lying in bed in no acute distress.  EYES: Pupils equal, round, reactive to light and accommodation. No scleral icterus. Extraocular muscles intact.  HEENT: Head atraumatic, normocephalic. Oropharynx and nasopharynx clear.  NECK:  Supple, no jugular venous distention. No thyroid enlargement, no tenderness.  LUNGS: Normal breath sounds bilaterally, no wheezing, rales, rhonchi. No use of accessory muscles of respiration.  CARDIOVASCULAR: S1, S2 normal. No murmurs, rubs, or gallops.  ABDOMEN: Soft, Tender in the lower abdomen, slightly distended. Hypoactive Bowel sounds. No organomegaly or mass.  EXTREMITIES: No cyanosis, clubbing or edema b/l.    NEUROLOGIC: Cranial nerves II through XII are intact. No focal Motor or sensory deficits b/l.   PSYCHIATRIC: The patient is alert and oriented x 3.  SKIN: No obvious rash, lesion, or ulcer.    LABORATORY PANEL:   CBC  Recent Labs Lab 01/29/17 0559  WBC 18.7*  HGB 11.1*  HCT 33.5*  PLT 237   ------------------------------------------------------------------------------------------------------------------  Chemistries   Recent Labs Lab 01/22/2017 2029 01/29/17 0559  NA 134* 138  K 3.2* 3.4*  CL 102 106  CO2 22 24  GLUCOSE 155* 123*  BUN 24* 24*  CREATININE 0.93 0.84  CALCIUM 7.5* 7.7*  AST 21  --  ALT 10*  --   ALKPHOS 84  --   BILITOT 0.9  --    ------------------------------------------------------------------------------------------------------------------  Cardiac Enzymes No results for input(s): TROPONINI in the last 168 hours. ------------------------------------------------------------------------------------------------------------------  RADIOLOGY:  Dg Abdomen 1 View  Result Date: 01/29/2017 CLINICAL DATA:  83 year old  female status post enteric tube placement. EXAM: ABDOMEN - 1 VIEW COMPARISON:  Abdominal CT dated 01/27/2017 FINDINGS: An enteric tube is noted with tip over the mid stomach in the epigastric area. Recommend further advancing of the tube by approximately 3- 4 cm. Multiple dilated loops of small bowel again noted throughout the abdomen measuring up to 3.5 cm. No free air. Bibasilar atelectatic changes/ scarring as well as small right pleural effusion as seen on the prior CT. Atherosclerotic calcification of the aorta. Degenerative changes of the spine. Right common iliac artery vascular stent. IMPRESSION: Enteric tube with tip in the proximal or mid portion of the stomach. Recommend advancing the tube by approximately 3- 4 cm for optimal positioning. Dilated small bowel loops. Electronically Signed   By: Anner Crete M.D.   On: 01/29/2017 04:20   Dg Abdomen 1 View  Result Date: 01/18/2017 CLINICAL DATA:  Abdominal pain and constipation for 1 week. EXAM: ABDOMEN - 1 VIEW COMPARISON:  CT, 07/24/2016 FINDINGS: There is generalized increased bowel gas. There is no bowel dilation to suggest obstruction. Stool is noted in the right colon, mildly increased. There is dense atherosclerotic calcifications along the abdominal aorta extending into the iliac arteries. Right common iliac stent is noted. Soft tissues are otherwise unremarkable. There is irregular sclerosis along the left pubic symphysis consistent with a chronic fracture, new since the prior CT. The skeletal structures are demineralized. IMPRESSION: 1. No acute findings.  No evidence of bowel obstruction. 2. Mild increased stool in the right colon. Electronically Signed   By: Lajean Manes M.D.   On: 01/27/2017 20:53   Ct Abdomen Pelvis W Contrast  Result Date: 01/29/2017 CLINICAL DATA:  Abdominal pain and constipation x1 week with enema given yesterday with some results. Decreased bowel sounds. EXAM: CT ABDOMEN AND PELVIS WITH CONTRAST TECHNIQUE:  Multidetector CT imaging of the abdomen and pelvis was performed using the standard protocol following bolus administration of intravenous contrast. CONTRAST:  31mL ISOVUE-300 IOPAMIDOL (ISOVUE-300) INJECTION 61% COMPARISON:  07/24/2016 FINDINGS: Lower chest: Borderline cardiomegaly with 3 vessel coronary arteriosclerosis. No pericardial effusion. There is thoracic aortic atherosclerosis. Trace bilateral pleural effusions with bilateral subsegmental atelectasis within both lower lobes. Small hiatal hernia Hepatobiliary: Homogeneous attenuation of the liver with a tiny cyst or hemangioma measuring 3 mm in the right hepatic lobe posteriorly, too small to further characterize. Trace perihepatic fluid. Distended gallbladder without wall thickening nor evidence cholelithiasis. Pancreas: Unremarkable. No pancreatic ductal dilatation or surrounding inflammatory changes. Spleen: Normal in size without focal abnormality. Adrenals/Urinary Tract: Normal bilateral adrenal glands. Mild cortical thinning in the lower poles bilaterally with interpolar left renal 8-9 mm stable complex appearing cyst since 2017 likely a benign finding. Urinary bladder is distended without calculus or mass. No wall thickening. Stomach/Bowel: Diffuse transmural thickening of large bowel containing stool throughout save for the rectosigmoid. Mild proximal small bowel distention with contrast which may represent changes due to peristalsis or dysmotility. Scattered diverticulosis. The patient is status post appendectomy. Stomach is contracted. Vascular/Lymphatic: Aortoiliac and branch vessel atherosclerosis. No adenopathy. Reproductive: Status post hysterectomy. No adnexal masses. Other: No abdominal wall hernia or abnormality. No abdominopelvic ascites. Musculoskeletal: Chronic right sacral alar and left parasymphyseal fractures. In etiology.  Lower lumbar facet arthropathy is noted. Schmorl's node involving the superior endplate of L3. IMPRESSION: 1.  Diffuse mild transmural thickening of large bowel with fecal retention consistent with an inflammatory colitis secondary to retained stool (stercoral colitis). No mechanical bowel obstruction. Mild small bowel ileus or dysmotility accounting for mild distention with contrast. 2. Aortoiliac atherosclerosis.  No aneurysm. 3. Stable 8 mm interpolar left renal cyst. 4. Tiny 3 mm right hepatic hypodensity too small to further characterize but statistically consistent with a cyst or hemangioma. 5. Lumbar spondylosis; chronic right sacral alar and left parasymphyseal fractures. Electronically Signed   By: Simek Royalty M.D.   On: 01/29/2017 00:13     ASSESSMENT AND PLAN:   83 year old female past medical history of spinal stenosis, neuropathy, osteoporosis, hypothyroidism, hypertension, hyperlipidemia, gout, GERD, diabetes, COPD, history of CHF who presented to the hospital due to abdominal pain, nausea vomiting and noted to have CT scan findings suggestive of ileus and stercoral colitis.  1. Ileus/stercoral colitis-secondary to retained stool. Patient presently not having any nausea or vomiting. No drainage noted from the NG tube. -CT scan suggestive of retained stool. Patient took an enema at home prior to coming to the hospital with some minimal response. I will give a Dulcolax suppository and follow response. -Continue supportive care with IV fluids, anti-emetics. Await further surgical input.  2. Leukocytosis - due to # 1.  - no acute infectious source.  Hold off on abx and follow WBC count.   3. Hyperlipidemia - cont. Atorvastatin  4. Hypokalemia - due to N/V.  - improved w/ supplementation and will monitor.   5. Neuropathy - cont. Gabapentin.   All the records are reviewed and case discussed with Care Management/Social Worker. Management plans discussed with the patient, family and they are in agreement.  CODE STATUS: DNR  DVT Prophylaxis: Lovenox  TOTAL TIME TAKING CARE OF THIS PATIENT:  30 minutes.   POSSIBLE D/C IN 1-2 DAYS, DEPENDING ON CLINICAL CONDITION.   Henreitta Leber M.D on 01/29/2017 at 12:06 PM  Between 7am to 6pm - Pager - [phone]  After 6pm go to [email or web address] - Proofreader  [facility]  Office  [phone]  CC: Primary care physician; Cyndi Bender, PA-C

## 2017-01-29 NOTE — Consult Note (Signed)
Patient ID: Tracy Richards, female   DOB: [birth year], 83 y.o.   MRN: [id number]  HPI Tracy Richards is a 83 y.o. female w 4 day hx of abdominal pain and distension , nausea and vomiting. She reports that her pain is intermittent diffuse and colicky in nature. There is no specific aggravating or allergen being factors. Of note the patient has had a long history of constipation but she is still passing gas. She does have a history of an appendectomy and abdominal hysterectomy in the past. She is functional for a 83 year old female she is able to perform or help her ADLs without any help. Further workup included a CT scan that I have personally reviewed there is evidence of mild inflammatory response around the transverse colon. Significant constipation and stool. No evidence of large bowel obstruction. There is small bowel dilation on today's KUB. No evidence of free air no evidence of pneumatosis no evidence of ischemic bowel. She does have an increased white count of 18,000, normal creatinine. D/W Dr. Verdell Carmine in detail  HPI  Past Medical History:  Diagnosis Date  . Asthma   . Benign paroxysmal vertigo   . CHF (congestive heart failure) ([place])   . COPD (chronic obstructive pulmonary disease) ([place])   . Depression   . Diabetes ([place])   . GERD (gastroesophageal reflux disease)   . Gout   . Hyperlipidemia   . Hypertension   . Hypothyroid   . Osteoporosis   . Peripheral neuropathy   . PVD (peripheral vascular disease) ([place])    blochage in Right leg -- had stent placement  . Renal artery stenosis ([place])   . Spinal stenosis   . Spinal stenosis   . Unspecified hereditary and idiopathic peripheral neuropathy     Past Surgical History:  Procedure Laterality Date  . ABDOMINAL HYSTERECTOMY    . APPENDECTOMY    . BACK SURGERY    . CATARACT EXTRACTION W/PHACO    . ILIAC ARTERY STENT    . KIDNEY SURGERY     ballooned multiple times  . TONSILLECTOMY    . VEIN SURGERY     VEIN STRIPPED     Family History  Problem Relation Age of Onset  . Sudden death Father     unknown causes  . Hypertension Mother   . Sudden death Mother     unknown causes  . Kidney disease Brother     Brights disease  . Bone cancer Brother   . Ovarian cancer Daughter     Social History Social History  Substance Use Topics  . Smoking status: Former Smoker    Packs/day: 0.25    Years: 20.00    Types: Cigarettes    Quit date: 10/03/1978  . Smokeless tobacco: Never Used  . Alcohol use No    Allergies  Allergen Reactions  . Amlodipine Swelling  . Avelox [Moxifloxacin Hcl In Nacl] Other (See Comments)    G.I. Upset   . Carafate [Sucralfate] Itching    Ulcers also   . Celecoxib Other (See Comments)    Reaction unknown  . Diazepam Hives  . Doxazosin Other (See Comments)    Reaction unknown  . Klonopin [Clonazepam] Hives  . Levofloxacin Cough    Choking Sensation  . Norvasc [Amlodipine Besylate] Swelling  . Pantoprazole Sodium Itching  . Ranitidine Itching  . Rofecoxib Other (See Comments)    Reaction unknown  . Shellfish Allergy Diarrhea  . Sulfa Antibiotics Other (See Comments)    Mouth Sores  .  Sulfa Drugs Cross Reactors Other (See Comments)    Mouth Sores  . Codeine Anxiety    "Makes me crazy"  . Tape Rash and Other (See Comments)    SKIN IS VERY THIN; TEARS AND BRUISES VERY EASILY!!    Current Facility-Administered Medications  Medication Dose Route Frequency Provider Last Rate Last Dose  . 0.9 % NaCl with KCl 20 mEq/ L  infusion   Intravenous Continuous Saundra Shelling, MD 100 mL/hr at 01/29/17 0512 100 mL/hr at 01/29/17 4128  . acetaminophen (TYLENOL) tablet 650 mg  650 mg Oral Q6H PRN Saundra Shelling, MD       Or  . acetaminophen (TYLENOL) suppository 650 mg  650 mg Rectal Q6H PRN Pavan Pyreddy, MD      . albuterol (PROVENTIL) (2.5 MG/3ML) 0.083% nebulizer solution 3 mL  3 mL Inhalation Q6H PRN Pavan Pyreddy, MD      . atorvastatin (LIPITOR) tablet 40 mg  40 mg Oral QHS  Henreitta Leber, MD      . enoxaparin (LOVENOX) injection 40 mg  40 mg Subcutaneous Q24H Henreitta Leber, MD   40 mg at 01/29/17 1410  . gabapentin (NEURONTIN) tablet 600 mg  600 mg Oral BID Henreitta Leber, MD      . morphine 2 MG/ML injection 2 mg  2 mg Intravenous Q4H PRN Saundra Shelling, MD   2 mg at 01/29/17 1009  . ondansetron (ZOFRAN) tablet 4 mg  4 mg Oral Q6H PRN Saundra Shelling, MD       Or  . ondansetron (ZOFRAN) injection 4 mg  4 mg Intravenous Q6H PRN Pavan Pyreddy, MD      . sodium chloride flush (NS) 0.9 % injection 3 mL  3 mL Intravenous Q12H Pavan Pyreddy, MD   3 mL at 01/29/17 1006     Review of Systems Full ROS  was asked and was negative except for the information on the HPI  Physical Exam Blood pressure (!) 85/55, pulse 86, temperature 98.9 F (37.2 C), temperature source Oral, resp. rate 18, height 5' (1.524 m), weight 61 kg (134 lb 6.4 oz), SpO2 91 %. CONSTITUTIONAL: Elderly female in NAD EYES: Pupils are equal, round, and reactive to light, Sclera are non-icteric. EARS, NOSE, MOUTH AND THROAT: The oropharynx is clear. The oral mucosa is pink and moist. Hearing is intact to voice. LYMPH NODES:  Lymph nodes in the neck are normal. RESPIRATORY:  Lungs are clear. There is normal respiratory effort, with equal breath sounds bilaterally, and without pathologic use of accessory muscles. CARDIOVASCULAR: Heart is regular without murmurs, gallops, or rubs. GI: The abdomen is  soft, nontender, mildly distended. There are no palpable masses. There is no hepatosplenomegaly. There are decreased bowel sounds  GU: Rectal deferred.   MUSCULOSKELETAL: Normal muscle strength and tone. No cyanosis or edema.   SKIN: Turgor is good and there are no pathologic skin lesions or ulcers. NEUROLOGIC: Motor and sensation is grossly normal. Cranial nerves are grossly intact. PSYCH:  Oriented to person, place and time. Affect is normal.  Data Reviewed  I have personally reviewed the patient's  imaging, laboratory findings and medical records.    Assessment/ Plan Ileus , colitis related to constipation. No evidence of ischemic bowel. No need for surgical intervention For now will keep the NGt and advance it. Serial abdominal exam and KUBs. No need for surgical intervention at this time. We will continue to follow. I will add as Zosyn and will recheck a KUB in the  morning as well as updated labs. D/W pt and family at length.   Caroleen Hamman, MD FACS General Surgeon 01/29/2017, 3:57 PM

## 2017-01-29 NOTE — ED Notes (Signed)
Pt transport to 151 

## 2017-01-29 NOTE — Progress Notes (Signed)
NG tube advanced from 40cm to 50cm per MD. Ammie Dalton, RN

## 2017-01-29 NOTE — Progress Notes (Signed)
New Admission Note:   Arrival Method: per stretcher from ED, pt came from home living with daughter Mental Orientation: alert and oriented to self, place and situation, disoriented to time, forgetful Telemetry: placed on box 4049, CCMD notified, verified with H'dia, NT Assessment: Completed Skin: warm, dry with old skin tear noted on the right knee from a previous fall, cleansed. Scattered bruises noted on both arms and legs. Redness on the sacrum noted, prophylactic sacral foam dressing applied. IV: G20 on the left [facility] with transparent dressing, fluids infusing well Pain: denies any pain as of this time Tubes: NG tube on the left nare inserted in the ED, 2Lpm O2 inhalation via nasal cannula-chronic Safety Measures: Safety Fall Prevention Plan has been given and discussed Admission: Completed 1A Orientation: Patient has been oriented to the room, unit and staff.  Family: daughter and son in law at bedside  Orders have been reviewed and implemented. Will continue to monitor patient. Call light has been placed within reach and bed alarm has been activated.   Georgeanna Harrison BSN, RN [facility] 1A

## 2017-01-29 NOTE — Progress Notes (Signed)
Paged and spoke to Dr. Estanislado Pandy about pt's BP=74/40 and 64/38, rechecked manually 89/49, placed an order for NS bolus 546ml IV. Will administer and continue to monitor.

## 2017-01-29 NOTE — Progress Notes (Addendum)
NG tube almost out upon arrival to floor. Advanced catheter to marking 45.  placement verified with auscultation attached to int LWS. Primary nurse made aware.

## 2017-01-29 NOTE — ED Provider Notes (Signed)
-----------------------------------------   12:41 AM on 01/29/2017 -----------------------------------------   Blood pressure 112/81, pulse (!) 109, temperature 98.7 F (37.1 C), temperature source Oral, resp. rate (!) 26, height 5' (1.524 m), weight 140 lb (63.5 kg), SpO2 98 %.  Assuming care from Dr. Alfred Levins .  In short, Tracy Richards is a 83 y.o. female with a chief complaint of Abdominal Pain and Constipation .  Refer to the original H&P for additional details.  The current plan of care is to follow up the results of the CT scan and disposition the patient.   Clinical Course as of Jan 30 40  Sun Jan 29, 2017  0040 1. Diffuse mild transmural thickening of large bowel with fecal retention consistent with an inflammatory colitis secondary to retained stool (stercoral colitis). No mechanical bowel obstruction. Mild small bowel ileus or dysmotility accounting for mild distention with contrast. 2. Aortoiliac atherosclerosis. No aneurysm. 3. Stable 8 mm interpolar left renal cyst. 4. Tiny 3 mm right hepatic hypodensity too small to further characterize but statistically consistent with a cyst or hemangioma. 5. Lumbar spondylosis; chronic right sacral alar and left parasymphyseal fractures.   CT Abdomen Pelvis W Contrast [AW]    Clinical Course User Index [AW] Loney Hering, MD   It appears that try colitis. The patient remains tachycardic in the emergency department. I'll give her some ciprofloxacin and Flagyl. I will also admit the patient to the hospitalist service for ileus and colitis.   Loney Hering, MD 01/29/17 [phone]

## 2017-01-29 NOTE — ED Notes (Signed)
Pt with stool smear on diaper. Pt changed and cleaned at this time.

## 2017-01-29 NOTE — Plan of Care (Signed)
Problem: Tissue Perfusion: Goal: Risk factors for ineffective tissue perfusion will decrease Outcome: Progressing Pt has SCD's on and is on lovenox prophylaxis.   Problem: Nutrition: Goal: Adequate nutrition will be maintained Outcome: Not Progressing Pt is NPO with NG tube to low intermittent suction.  Problem: Bowel/Gastric: Goal: Will not experience complications related to bowel motility Outcome: Not Progressing Pt unable to have adequate bowel movement. Has hypoactive bowel sounds and pain in abdomen.

## 2017-01-29 NOTE — H&P (Addendum)
[facility] NAME: Tracy Richards    MR#:  [id number]  DATE OF BIRTH:  12-Oct-1928  DATE OF ADMISSION:  01/12/2017  PRIMARY CARE PHYSICIAN: Cyndi Bender, PA-C   REQUESTING/REFERRING PHYSICIAN:   CHIEF COMPLAINT:   Chief Complaint  Patient presents with  . Abdominal Pain  . Constipation    HISTORY OF PRESENT ILLNESS: Tracy Richards  is a 83 y.o. female with a known history of Bronchial asthma, congestive heart failure, COPD, GERD, hyperlipidemia, hypertension presented to the emergency room with abdominal pain for the last 2 days. Patient has this abdominal pain which is generalized aching in nature 5 out of 10 on a scale of 1-10. She also has some nausea and vomiting. Last bowel movement was yesterday. Patient was evaluated in the emergency room CT abdomen showed a inflammatory changes in the cold on and bowel ileus. Patient was given IV Flagyl antibiotic in the emergency room and hospitalist service was consulted. No complaints of any chest pain, shortness of breath. No fever or chills. No complaints of any rectal bleed.  PAST MEDICAL HISTORY:   Past Medical History:  Diagnosis Date  . Asthma   . Benign paroxysmal vertigo   . CHF (congestive heart failure) ([place])   . COPD (chronic obstructive pulmonary disease) ([place])   . Depression   . Diabetes ([place])   . GERD (gastroesophageal reflux disease)   . Gout   . Hyperlipidemia   . Hypertension   . Hypothyroid   . Osteoporosis   . Peripheral neuropathy   . PVD (peripheral vascular disease) ([place])    blochage in Right leg -- had stent placement  . Renal artery stenosis ([place])   . Spinal stenosis   . Spinal stenosis   . Unspecified hereditary and idiopathic peripheral neuropathy     PAST SURGICAL HISTORY: Past Surgical History:  Procedure Laterality Date  . ABDOMINAL HYSTERECTOMY    . APPENDECTOMY    . BACK SURGERY    . CATARACT EXTRACTION W/PHACO    . ILIAC ARTERY STENT    .  KIDNEY SURGERY     ballooned multiple times  . TONSILLECTOMY    . VEIN SURGERY     VEIN STRIPPED    SOCIAL HISTORY:  Social History  Substance Use Topics  . Smoking status: Former Smoker    Packs/day: 0.25    Years: 20.00    Types: Cigarettes    Quit date: 10/03/1978  . Smokeless tobacco: Never Used  . Alcohol use No    FAMILY HISTORY:  Family History  Problem Relation Age of Onset  . Sudden death Father     unknown causes  . Hypertension Mother   . Sudden death Mother     unknown causes  . Kidney disease Brother     Brights disease  . Bone cancer Brother   . Ovarian cancer Daughter     DRUG ALLERGIES:  Allergies  Allergen Reactions  . Amlodipine Swelling  . Avelox [Moxifloxacin Hcl In Nacl] Other (See Comments)    G.I. Upset   . Carafate [Sucralfate] Itching    Ulcers also   . Celecoxib Other (See Comments)    Reaction unknown  . Diazepam Hives  . Doxazosin Other (See Comments)    Reaction unknown  . Klonopin [Clonazepam] Hives  . Levofloxacin Cough    Choking Sensation  . Norvasc [Amlodipine Besylate] Swelling  . Pantoprazole Sodium Itching  . Ranitidine Itching  . Rofecoxib Other (  See Comments)    Reaction unknown  . Shellfish Allergy Diarrhea  . Sulfa Antibiotics Other (See Comments)    Mouth Sores  . Sulfa Drugs Cross Reactors Other (See Comments)    Mouth Sores  . Codeine Anxiety    "Makes me crazy"  . Tape Rash and Other (See Comments)    SKIN IS VERY THIN; TEARS AND BRUISES VERY EASILY!!    REVIEW OF SYSTEMS:   CONSTITUTIONAL: No fever, fatigue or weakness.  EYES: No blurred or double vision.  EARS, NOSE, AND THROAT: No tinnitus or ear pain.  RESPIRATORY: No cough, shortness of breath, wheezing or hemoptysis.  CARDIOVASCULAR: No chest pain, orthopnea, edema.  GASTROINTESTINAL: Has nausea, vomiting,  abdominal pain.  No diarrhea GENITOURINARY: No dysuria, hematuria.  ENDOCRINE: No polyuria, nocturia,  HEMATOLOGY: No anemia, easy  bruising or bleeding SKIN: No rash or lesion. MUSCULOSKELETAL: No joint pain or arthritis.   NEUROLOGIC: No tingling, numbness, weakness.  PSYCHIATRY: No anxiety or depression.   MEDICATIONS AT HOME:  Prior to Admission medications   Medication Sig Start Date End Date Taking? Authorizing Provider  acetaminophen (TYLENOL) 500 MG tablet Take 2 tablets (1,000 mg total) by mouth 3 (three) times daily. Patient taking differently: Take 500 mg by mouth every 8 (eight) hours as needed for mild pain, fever or headache.  07/26/16 07/26/17 Yes Rudene Re, MD  albuterol (PROVENTIL) (2.5 MG/3ML) 0.083% nebulizer solution Inhale 3 mLs into the lungs every 6 (six) hours as needed for wheezing or shortness of breath.    Yes Historical Provider, MD  ALPRAZolam Duanne Moron) 0.5 MG tablet Take 0.5 mg by mouth 2 (two) times daily as needed for anxiety.   Yes Historical Provider, MD  aspirin EC 81 MG tablet Take 81 mg by mouth daily.   Yes Historical Provider, MD  atorvastatin (LIPITOR) 40 MG tablet Take 40 mg by mouth at bedtime.    Yes Historical Provider, MD  diltiazem (DILACOR XR) 240 MG 24 hr capsule Take 240 mg by mouth daily.    Yes Historical Provider, MD  docusate sodium (COLACE) 100 MG capsule Take 100 mg by mouth 2 (two) times daily.   Yes Historical Provider, MD  feeding supplement, GLUCERNA SHAKE, (GLUCERNA SHAKE) LIQD Take 237 mLs by mouth 2 (two) times daily between meals. 10/12/16  Yes Mercy Riding, MD  fluticasone (FLONASE) 50 MCG/ACT nasal spray Place 2 sprays into both nostrils daily.   Yes Historical Provider, MD  Fluticasone-Salmeterol (ADVAIR DISKUS) 250-50 MCG/DOSE AEPB Inhale 1 puff into the lungs 2 (two) times daily. 03/16/15  Yes Tanda Rockers, MD  gabapentin (NEURONTIN) 600 MG tablet Take 1 tablet (600 mg total) by mouth 2 (two) times daily. 10/12/16  Yes Mercy Riding, MD  levothyroxine (SYNTHROID, LEVOTHROID) 100 MCG tablet Take 100 mcg by mouth daily. 10/19/14  Yes Historical Provider, MD   nitroGLYCERIN (NITROSTAT) 0.4 MG SL tablet Place 0.4 mg under the tongue every 5 (five) minutes as needed.   Yes Historical Provider, MD  nystatin (MYCOSTATIN) 100000 UNIT/ML suspension Take 5 mLs by mouth 4 (four) times daily.   Yes Historical Provider, MD  pantoprazole (PROTONIX) 40 MG tablet Take 40 mg by mouth daily.   Yes Historical Provider, MD  torsemide (DEMADEX) 20 MG tablet Take 20-40 mg by mouth daily as needed for fluid.   Yes Historical Provider, MD  triamcinolone cream (KENALOG) 0.1 % Apply 1 application topically 2 (two) times daily.   Yes Historical Provider, MD  PHYSICAL EXAMINATION:   VITAL SIGNS: Blood pressure 129/84, pulse 85, temperature 98.7 F (37.1 C), temperature source Oral, resp. rate (!) 23, height 5' (1.524 m), weight 63.5 kg (140 lb), SpO2 96 %.  GENERAL:  83 y.o.-year-old patient lying in the bed with no acute distress.  EYES: Pupils equal, round, reactive to light and accommodation. No scleral icterus. Extraocular muscles intact.  HEENT: Head atraumatic, normocephalic. Oropharynx dry and nasopharynx clear.  NECK:  Supple, no jugular venous distention. No thyroid enlargement, no tenderness.  LUNGS: Normal breath sounds bilaterally, no wheezing, rales,rhonchi or crepitation. No use of accessory muscles of respiration.  CARDIOVASCULAR: S1, S2 normal. No murmurs, rubs, or gallops.  ABDOMEN: Soft, tenderness around umbilicus, mild distension noted. Bowel sounds diminished. No organomegaly or mass.  EXTREMITIES: No pedal edema, cyanosis, or clubbing.  NEUROLOGIC: Cranial nerves II through XII are intact. Muscle strength 5/5 in all extremities. Sensation intact. Gait not checked.  PSYCHIATRIC: The patient is alert and oriented x 3.  SKIN: No obvious rash, lesion, or ulcer.   LABORATORY PANEL:   CBC  Recent Labs Lab 01/04/2017 2029  WBC 17.1*  HGB 12.5  HCT 36.9  PLT 261  MCV 89.5  MCH 30.4  MCHC 33.9  RDW 13.5  LYMPHSABS 0.7*  MONOABS 2.0*   EOSABS 0.0  BASOSABS 0.0   ------------------------------------------------------------------------------------------------------------------  Chemistries   Recent Labs Lab 01/27/2017 2029  NA 134*  K 3.2*  CL 102  CO2 22  GLUCOSE 155*  BUN 24*  CREATININE 0.93  CALCIUM 7.5*  AST 21  ALT 10*  ALKPHOS 84  BILITOT 0.9   ------------------------------------------------------------------------------------------------------------------ estimated creatinine clearance is 36.8 mL/min (by C-G formula based on SCr of 0.93 mg/dL). ------------------------------------------------------------------------------------------------------------------ No results for input(s): TSH, T4TOTAL, T3FREE, THYROIDAB in the last 72 hours.  Invalid input(s): FREET3   Coagulation profile No results for input(s): INR, PROTIME in the last 168 hours. ------------------------------------------------------------------------------------------------------------------- No results for input(s): DDIMER in the last 72 hours. -------------------------------------------------------------------------------------------------------------------  Cardiac Enzymes No results for input(s): CKMB, TROPONINI, MYOGLOBIN in the last 168 hours.  Invalid input(s): CK ------------------------------------------------------------------------------------------------------------------ Invalid input(s): POCBNP  ---------------------------------------------------------------------------------------------------------------  Urinalysis    Component Value Date/Time   COLORURINE AMBER (A) 01/16/2017 2030   APPEARANCEUR HAZY (A) 01/14/2017 2030   APPEARANCEUR Clear 01/19/2015 1401   LABSPEC 1.017 01/08/2017 2030   LABSPEC 1.009 01/19/2015 1401   PHURINE 5.0 01/27/2017 2030   GLUCOSEU NEGATIVE 01/04/2017 2030   GLUCOSEU 50 mg/dL 01/19/2015 1401   HGBUR NEGATIVE 01/24/2017 2030   [place] NEGATIVE 01/16/2017 2030   BILIRUBINUR  Negative 01/19/2015 1401   KETONESUR 5 (A) 01/18/2017 2030   PROTEINUR 30 (A) 01/22/2017 2030   NITRITE NEGATIVE 01/27/2017 2030   LEUKOCYTESUR NEGATIVE 01/05/2017 2030   LEUKOCYTESUR Negative 01/19/2015 1401     RADIOLOGY: Dg Abdomen 1 View  Result Date: 01/14/2017 CLINICAL DATA:  Abdominal pain and constipation for 1 week. EXAM: ABDOMEN - 1 VIEW COMPARISON:  CT, 07/24/2016 FINDINGS: There is generalized increased bowel gas. There is no bowel dilation to suggest obstruction. Stool is noted in the right colon, mildly increased. There is dense atherosclerotic calcifications along the abdominal aorta extending into the iliac arteries. Right common iliac stent is noted. Soft tissues are otherwise unremarkable. There is irregular sclerosis along the left pubic symphysis consistent with a chronic fracture, new since the prior CT. The skeletal structures are demineralized. IMPRESSION: 1. No acute findings.  No evidence of bowel obstruction. 2. Mild increased stool in the right colon. Electronically Signed  By: Lajean Manes M.D.   On: 01/05/2017 20:53   Ct Abdomen Pelvis W Contrast  Result Date: 01/29/2017 CLINICAL DATA:  Abdominal pain and constipation x1 week with enema given yesterday with some results. Decreased bowel sounds. EXAM: CT ABDOMEN AND PELVIS WITH CONTRAST TECHNIQUE: Multidetector CT imaging of the abdomen and pelvis was performed using the standard protocol following bolus administration of intravenous contrast. CONTRAST:  68mL ISOVUE-300 IOPAMIDOL (ISOVUE-300) INJECTION 61% COMPARISON:  07/24/2016 FINDINGS: Lower chest: Borderline cardiomegaly with 3 vessel coronary arteriosclerosis. No pericardial effusion. There is thoracic aortic atherosclerosis. Trace bilateral pleural effusions with bilateral subsegmental atelectasis within both lower lobes. Small hiatal hernia Hepatobiliary: Homogeneous attenuation of the liver with a tiny cyst or hemangioma measuring 3 mm in the right hepatic lobe  posteriorly, too small to further characterize. Trace perihepatic fluid. Distended gallbladder without wall thickening nor evidence cholelithiasis. Pancreas: Unremarkable. No pancreatic ductal dilatation or surrounding inflammatory changes. Spleen: Normal in size without focal abnormality. Adrenals/Urinary Tract: Normal bilateral adrenal glands. Mild cortical thinning in the lower poles bilaterally with interpolar left renal 8-9 mm stable complex appearing cyst since 2017 likely a benign finding. Urinary bladder is distended without calculus or mass. No wall thickening. Stomach/Bowel: Diffuse transmural thickening of large bowel containing stool throughout save for the rectosigmoid. Mild proximal small bowel distention with contrast which may represent changes due to peristalsis or dysmotility. Scattered diverticulosis. The patient is status post appendectomy. Stomach is contracted. Vascular/Lymphatic: Aortoiliac and branch vessel atherosclerosis. No adenopathy. Reproductive: Status post hysterectomy. No adnexal masses. Other: No abdominal wall hernia or abnormality. No abdominopelvic ascites. Musculoskeletal: Chronic right sacral alar and left parasymphyseal fractures. In etiology. Lower lumbar facet arthropathy is noted. Schmorl's node involving the superior endplate of L3. IMPRESSION: 1. Diffuse mild transmural thickening of large bowel with fecal retention consistent with an inflammatory colitis secondary to retained stool (stercoral colitis). No mechanical bowel obstruction. Mild small bowel ileus or dysmotility accounting for mild distention with contrast. 2. Aortoiliac atherosclerosis.  No aneurysm. 3. Stable 8 mm interpolar left renal cyst. 4. Tiny 3 mm right hepatic hypodensity too small to further characterize but statistically consistent with a cyst or hemangioma. 5. Lumbar spondylosis; chronic right sacral alar and left parasymphyseal fractures. Electronically Signed   By: Difiore Royalty M.D.   On:  01/29/2017 00:13    EKG: Orders placed or performed during the hospital encounter of 01/17/2017  . EKG 12-Lead  . EKG 12-Lead    IMPRESSION AND PLAN: 83 year old elderly female patient with history of COPD, diabetes mellitus, GERD,, hyperlipidemia, hypertension presented to the emergency room with abdominal distention, nausea and vomiting and abdominal pain. Admitting diagnosis 1. Bowel ileus 2. Acute colitis 3. Hyponatremia 4 hypokalemia 5. Abdominal pain Treatment plan Admit patient to medical floor Keep patient nothing by mouth NG tube with suction Surgery consultation Potassium supplementation intravenously Pain management with IV morphine IV Flagyl antibiotic 5 MG every 8 hourly Sequential compression devices to lower extremities for DVT prophylaxis  All the records are reviewed and case discussed with ED provider. Management plans discussed with the patient, family and they are in agreement.  CODE STATUS:DNR Surrogate decision maker : Daughter Code Status History    Date Active Date Inactive Code Status Order ID Comments User Context   10/08/2016  6:33 PM 10/12/2016  7:16 PM DNR [id number]  Mercy Riding, MD Inpatient    Questions for Most Recent Historical Code Status (Order [id number])    Question Answer Comment   In the  event of cardiac or respiratory ARREST Do not call a "code blue"    In the event of cardiac or respiratory ARREST Do not perform Intubation, CPR, defibrillation or ACLS    In the event of cardiac or respiratory ARREST Use medication by any route, position, wound care, and other measures to relive pain and suffering. May use oxygen, suction and manual treatment of airway obstruction as needed for comfort.         Advance Directive Documentation     Most Recent Value  Type of Advance Directive  Healthcare Power of Attorney  Pre-existing out of facility DNR order (yellow form or pink MOST form)  -  "MOST" Form in Place?  -       TOTAL TIME TAKING  CARE OF THIS PATIENT: 54 minutes.    Saundra Shelling M.D on 01/29/2017 at 3:15 AM  Between 7am to 6pm - Pager - [phone]  After 6pm go to [email or web address] - password [email or web address] [facility]  Office  [phone]  CC: Primary care physician; Cyndi Bender, PA-C

## 2017-01-30 ENCOUNTER — Inpatient Hospital Stay: Payer: Medicare Other

## 2017-01-30 LAB — CBC
HEMATOCRIT: 33.2 % — AB (ref 35.0–47.0)
Hemoglobin: 11 g/dL — ABNORMAL LOW (ref 12.0–16.0)
MCH: 29.9 pg (ref 26.0–34.0)
MCHC: 33 g/dL (ref 32.0–36.0)
MCV: 90.6 fL (ref 80.0–100.0)
Platelets: 225 10*3/uL (ref 150–440)
RBC: 3.66 MIL/uL — ABNORMAL LOW (ref 3.80–5.20)
RDW: 13.8 % (ref 11.5–14.5)
WBC: 12.7 10*3/uL — AB (ref 3.6–11.0)

## 2017-01-30 LAB — BASIC METABOLIC PANEL
ANION GAP: 5 (ref 5–15)
BUN: 20 mg/dL (ref 6–20)
CALCIUM: 7.8 mg/dL — AB (ref 8.9–10.3)
CO2: 24 mmol/L (ref 22–32)
CREATININE: 0.78 mg/dL (ref 0.44–1.00)
Chloride: 112 mmol/L — ABNORMAL HIGH (ref 101–111)
GFR calc Af Amer: >60 mL/min (ref >60)
GLUCOSE: 93 mg/dL (ref 65–99)
Potassium: 4.1 mmol/L (ref 3.5–5.1)
Sodium: 141 mmol/L (ref 135–145)

## 2017-01-30 LAB — PROTIME-INR
INR: 1.2
Prothrombin Time: 15.3 seconds — ABNORMAL HIGH (ref 11.4–15.2)

## 2017-01-30 LAB — URINE CULTURE

## 2017-01-30 LAB — APTT: APTT: 37 s — AB (ref 24–36)

## 2017-01-30 MED ORDER — FUROSEMIDE 10 MG/ML IJ SOLN
20.0000 mg | Freq: Once | INTRAMUSCULAR | Status: AC
  Administered 2017-01-30: 20 mg via INTRAVENOUS

## 2017-01-30 MED ORDER — IPRATROPIUM-ALBUTEROL 0.5-2.5 (3) MG/3ML IN SOLN
3.0000 mL | Freq: Four times a day (QID) | RESPIRATORY_TRACT | Status: DC
  Administered 2017-01-30 – 2017-01-31 (×5): 3 mL via RESPIRATORY_TRACT
  Filled 2017-01-30 (×5): qty 3

## 2017-01-30 MED ORDER — PHENOL 1.4 % MT LIQD
1.0000 | OROMUCOSAL | Status: DC | PRN
  Administered 2017-01-30 (×2): 1 via OROMUCOSAL
  Filled 2017-01-30: qty 177

## 2017-01-30 NOTE — Plan of Care (Signed)
Problem: Skin Integrity: Goal: Risk for impaired skin integrity will decrease Outcome: Progressing Pt has NGT left nare. Skin will be monitored for pressure ulcers and impaired skin ingtegrity

## 2017-01-30 NOTE — Progress Notes (Signed)
Initial Nutrition Assessment  DOCUMENTATION CODES:   Not applicable  INTERVENTION:  1. Diet advancement per MD/NP/PA  NUTRITION DIAGNOSIS:   Inadequate oral intake related to inability to eat as evidenced by NPO status.  GOAL:   Patient will meet greater than or equal to 90% of their needs  MONITOR:   I & O's, Labs, Weight trends, Diet advancement  REASON FOR ASSESSMENT:   Malnutrition Screening Tool    ASSESSMENT:   Tracy Richards  is a 83 y.o. female with a known history of Bronchial asthma, congestive heart failure, COPD, GERD, hyperlipidemia, hypertension presented to the emergency room with abdominal pain for the last 2 days  Spoke with [name] at bedside. She reports good PO intake of 3 meals per day that she or her daughter cooks up until Saturday. Patient states starting Saturday everything she ate she would vomit. Completed NFPA - WNL She states she has been gaining weight - per chart she was gaining weight from last year, has recently lost 6#/4.3% over 20 days. NGT currently with 200cc dark brown suction. Patient seems comfortable - was swabbing her mouth with wet cotton swab during my visit. Will continue to monitor for diet advancement, stated she will get another X-ray today. Had 3 BM yesterday, ileus improving.  Labs and medications reviewed  Diet Order:  Diet NPO time specified  Skin:  Reviewed, no issues  Last BM:  01/29/2017  Height:   Ht Readings from Last 1 Encounters:  01/06/2017 5' (1.524 m)    Weight:   Wt Readings from Last 1 Encounters:  01/29/17 134 lb 6.4 oz (61 kg)    Ideal Body Weight:  45.45 kg  BMI:  Body mass index is 26.25 kg/m.  Estimated Nutritional Needs:   Kcal:  3710-6269 caloires  Protein:  61-73 gm  Fluid:  >/= 1.5L  EDUCATION NEEDS:   No education needs identified at this time  [name]. [name], MS, RD LDN Inpatient Clinical Dietitian Pager [phone]

## 2017-01-30 NOTE — Progress Notes (Signed)
SURGICAL PROGRESS NOTE (cpt [phone])  Hospital Day(s): 1.   Post op day(s):  Marland Kitchen   Interval History: Patient seen and examined, no acute events or new complaints overnight. Patient reports 3 medium-sized BM yesterday and 2 additional BM's today. She also reports that her abdominal pain seems to have further decreased/improved after her NG tube was advanced following her AXR this morning that demonstrated her NG tube in the esophagus. Prior to the NG tube being advanced, essentially nothing had been draining from the NG tube, and the NG tube has drained ~100 mL over the past 5 hours. Patient reports she continues to pass flatus and denies further N/V or any fever/chills, CP, or SOB. She states that she does not recall having the NG tube placed, but says that it is uncomfortable.  Review of Systems:  Constitutional: denies fever, chills  HEENT: denies cough or congestion  Respiratory: denies any shortness of breath  Cardiovascular: denies chest pain or palpitations  Gastrointestinal: abdominal pain, N/V, and bowel function as per interval history Genitourinary: denies burning with urination or urinary frequency Musculoskeletal: denies pain, decreased motor or sensation Integumentary: denies any other rashes or skin discolorations Neurological: denies HA or vision/hearing changes   Vital signs in last 24 hours: [min-max] current  Temp:  [97.7 F (36.5 C)-98.9 F (37.2 C)] 98.9 F (37.2 C) (04/29 1004) Pulse Rate:  [48-97] 48 (04/29 2311) Resp:  [18] 18 (04/29 2311) BP: (83-117)/(46-60) 117/46 (04/29 2311) SpO2:  [91 %-93 %] 93 % (04/29 2311)     Height: 5' (152.4 cm) Weight: 134 lb 6.4 oz (61 kg) BMI (Calculated): 27.4   Intake/Output this shift:  Total I/O In: 60 [P.O.:60] Out: 100 [Emesis/NG output:100]   Intake/Output last 2 shifts:  @IOLAST2SHIFTS @   Physical Exam:  Constitutional: alert, cooperative and no distress  HENT: normocephalic without obvious abnormality  Eyes:  PERRL, EOM's grossly intact and symmetric  Neuro: CN II - XII grossly intact and symmetric without deficit  Respiratory: breathing non-labored at rest  Cardiovascular: regular rate and sinus rhythm  Gastrointestinal: soft, mild diffuse abdominal tenderness to deep palpation, and non-distended  Musculoskeletal: UE and LE FROM, motor and sensation grossly intact, NT   Labs:  CBC Latest Ref Rng & Units 01/30/2017 01/29/2017 01/25/2017  WBC 3.6 - 11.0 K/uL 12.7(H) 18.7(H) 17.1(H)  Hemoglobin 12.0 - 16.0 g/dL 11.0(L) 11.1(L) 12.5  Hematocrit 35.0 - 47.0 % 33.2(L) 33.5(L) 36.9  Platelets 150 - 440 K/uL 225 237 261   CMP Latest Ref Rng & Units 01/30/2017 01/29/2017 01/19/2017  Glucose 65 - 99 mg/dL 93 123(H) 155(H)  BUN 6 - 20 mg/dL 20 24(H) 24(H)  Creatinine 0.44 - 1.00 mg/dL 0.78 0.84 0.93  Sodium 135 - 145 mmol/L 141 138 134(L)  Potassium 3.5 - 5.1 mmol/L 4.1 3.4(L) 3.2(L)  Chloride 101 - 111 mmol/L 112(H) 106 102  CO2 22 - 32 mmol/L 24 24 22   Calcium 8.9 - 10.3 mg/dL 7.8(L) 7.7(L) 7.5(L)  Total Protein 6.5 - 8.1 g/dL - - 5.2(L)  Total Bilirubin 0.3 - 1.2 mg/dL - - 0.9  Alkaline Phos 38 - 126 U/L - - 84  AST 15 - 41 U/L - - 21  ALT 14 - 54 U/L - - 10(L)    Imaging studies:  Abdominal X-ray (01/30/2017) - personally reviewed and discussed with patient NG tube tip again noted in the upper most portion of the stomach/distal esophagus, advancement of approximately 10 cm suggested. Distended loops of small and large bowel noted  suggesting adynamic ileus, slightly improved from prior exam. Oral contrast in the right colon. No free air. Right iliac stent noted. Diffuse osteopenia and degenerative change present.  Chest x-ray reveals cardiomegaly with pulmonary vascular prominence bilateral interstitial prominence bilateral pleural effusions consistent with CHF.   Assessment/Plan: (ICD-10's: K59.01, K56.7) 83 y.o. female with abdominal pain associated with severe constipation and adynamic  ileus, complicated by transverse colitis with now-improving leukocytosis and pertinent comorbidities including DM with peripheral neuropathy, HTN, HLD, COPD, PAD, hypothyroidism, GERD, osteoporosis, renal artery stenosis, and major depression disorder.   - follow-up/trend leukocytosis tomorrow morning  - since NG tube just advanced to functional level this morning and may be helping, continue to low intermittent suction through tomorrow   - will likely plan to remove NGT tomorrow (5/1) or Wednesday (5/2), at which time will start clear liquids diet +/- Mg hydroxide/citrate, Senna   - continue antibiotics as ordered for transverse colitis considering decreasing/improving leukocytosis  - will likely benefit from routine/scheduled bowel regimen including stool softeners at discharge  - medical management of comorbidities as per primary medical team  - currently no indication for surgical intervention at this time  - ambulation with assist vs frequent repositioning  All of the above findings and recommendations were discussed with the patient and patient's RN, and all of patient's questions were answered to her expressed satisfaction.  Thank you for the opportunity to participate in this patient's care.  -- [name], MD, [place]: [facility] - Partnering for exceptional care. Office: [phone]

## 2017-01-30 NOTE — Progress Notes (Signed)
[facility] [place] NAME: Tracy Richards    MR#:  [id number]  DATE OF BIRTH:  Nov 01, 1928  SUBJECTIVE:   Patient here due to abdominal pain nausea vomiting and noted to have an ileus and also suspected Constipated and colitis due to retained stool.    Had 3 medium bowel movements yesterday and once more today, still having distention and slightly improved ileus as per x-ray.  Chest x-ray shows pulmonary edema. Patient does complain of cough with secretions.   REVIEW OF SYSTEMS:    Review of Systems  Constitutional: Negative for chills and fever.  HENT: Negative for congestion and tinnitus.   Eyes: Negative for blurred vision and double vision.  Respiratory: Positive for cough. Negative for shortness of breath and wheezing.   Cardiovascular: Negative for chest pain, orthopnea and PND.  Gastrointestinal: Negative for diarrhea, nausea and vomiting.  Genitourinary: Negative for dysuria and hematuria.  Neurological: Negative for dizziness, sensory change and focal weakness.  All other systems reviewed and are negative.   Nutrition: NPO Tolerating Diet: No Tolerating PT: Await Eval.    DRUG ALLERGIES:   Allergies  Allergen Reactions  . Amlodipine Swelling  . Avelox [Moxifloxacin Hcl In Nacl] Other (See Comments)    G.I. Upset   . Carafate [Sucralfate] Itching    Ulcers also   . Celecoxib Other (See Comments)    Reaction unknown  . Diazepam Hives  . Doxazosin Other (See Comments)    Reaction unknown  . Klonopin [Clonazepam] Hives  . Levofloxacin Cough    Choking Sensation  . Norvasc [Amlodipine Besylate] Swelling  . Pantoprazole Sodium Itching  . Ranitidine Itching  . Rofecoxib Other (See Comments)    Reaction unknown  . Shellfish Allergy Diarrhea  . Sulfa Antibiotics Other (See Comments)    Mouth Sores  . Sulfa Drugs Cross Reactors Other (See Comments)    Mouth Sores  . Codeine Anxiety    "Makes me crazy"  . Tape Rash and  Other (See Comments)    SKIN IS VERY THIN; TEARS AND BRUISES VERY EASILY!!    VITALS:  Blood pressure (!) 117/46, pulse (!) 48, temperature 98.9 F (37.2 C), temperature source Oral, resp. rate 18, height 5' (1.524 m), weight 61 kg (134 lb 6.4 oz), SpO2 91 %.  PHYSICAL EXAMINATION:   Physical Exam  GENERAL:  83 y.o.-year-old patient lying in bed in some acute distress.  EYES: Pupils equal, round, reactive to light and accommodation. No scleral icterus. Extraocular muscles intact.  HEENT: Head atraumatic, normocephalic. Oropharynx and nasopharynx clear.  NECK:  Supple, no jugular venous distention. No thyroid enlargement, no tenderness.  LUNGS: Normal breath sounds bilaterally, no wheezing, some crepitations. No use of accessory muscles of respiration.    Have some cough with clear secretions. CARDIOVASCULAR: S1, S2 normal. No murmurs, rubs, or gallops.  ABDOMEN: Soft, Tender in the lower abdomen, slightly distended. Hypoactive Bowel sounds. No organomegaly or mass. NG tube with suction in place. EXTREMITIES: No cyanosis, clubbing or edema b/l.    NEUROLOGIC: Cranial nerves II through XII are intact. No focal Motor or sensory deficits b/l.   PSYCHIATRIC: The patient is alert and oriented x 3.  SKIN: No obvious rash, lesion, or ulcer.    LABORATORY PANEL:   CBC  Recent Labs Lab 01/30/17 0410  WBC 12.7*  HGB 11.0*  HCT 33.2*  PLT 225   ------------------------------------------------------------------------------------------------------------------  Chemistries   Recent Labs Lab 01/07/2017 2029  01/30/17 0410  NA 134*  < > 141  K 3.2*  < > 4.1  CL 102  < > 112*  CO2 22  < > 24  GLUCOSE 155*  < > 93  BUN 24*  < > 20  CREATININE 0.93  < > 0.78  CALCIUM 7.5*  < > 7.8*  AST 21  --   --   ALT 10*  --   --   ALKPHOS 84  --   --   BILITOT 0.9  --   --   < > = values in this interval not  displayed. ------------------------------------------------------------------------------------------------------------------  Cardiac Enzymes No results for input(s): TROPONINI in the last 168 hours. ------------------------------------------------------------------------------------------------------------------  RADIOLOGY:  Dg Abdomen 1 View  Result Date: 01/29/2017 CLINICAL DATA:  83 year old female status post enteric tube placement. EXAM: ABDOMEN - 1 VIEW COMPARISON:  Abdominal CT dated 01/16/2017 FINDINGS: An enteric tube is noted with tip over the mid stomach in the epigastric area. Recommend further advancing of the tube by approximately 3- 4 cm. Multiple dilated loops of small bowel again noted throughout the abdomen measuring up to 3.5 cm. No free air. Bibasilar atelectatic changes/ scarring as well as small right pleural effusion as seen on the prior CT. Atherosclerotic calcification of the aorta. Degenerative changes of the spine. Right common iliac artery vascular stent. IMPRESSION: Enteric tube with tip in the proximal or mid portion of the stomach. Recommend advancing the tube by approximately 3- 4 cm for optimal positioning. Dilated small bowel loops. Electronically Signed   By: Anner Crete M.D.   On: 01/29/2017 04:20   Dg Abdomen 1 View  Result Date: 01/11/2017 CLINICAL DATA:  Abdominal pain and constipation for 1 week. EXAM: ABDOMEN - 1 VIEW COMPARISON:  CT, 07/24/2016 FINDINGS: There is generalized increased bowel gas. There is no bowel dilation to suggest obstruction. Stool is noted in the right colon, mildly increased. There is dense atherosclerotic calcifications along the abdominal aorta extending into the iliac arteries. Right common iliac stent is noted. Soft tissues are otherwise unremarkable. There is irregular sclerosis along the left pubic symphysis consistent with a chronic fracture, new since the prior CT. The skeletal structures are demineralized. IMPRESSION: 1. No  acute findings.  No evidence of bowel obstruction. 2. Mild increased stool in the right colon. Electronically Signed   By: Lajean Manes M.D.   On: 01/24/2017 20:53   Ct Abdomen Pelvis W Contrast  Result Date: 01/29/2017 CLINICAL DATA:  Abdominal pain and constipation x1 week with enema given yesterday with some results. Decreased bowel sounds. EXAM: CT ABDOMEN AND PELVIS WITH CONTRAST TECHNIQUE: Multidetector CT imaging of the abdomen and pelvis was performed using the standard protocol following bolus administration of intravenous contrast. CONTRAST:  65mL ISOVUE-300 IOPAMIDOL (ISOVUE-300) INJECTION 61% COMPARISON:  07/24/2016 FINDINGS: Lower chest: Borderline cardiomegaly with 3 vessel coronary arteriosclerosis. No pericardial effusion. There is thoracic aortic atherosclerosis. Trace bilateral pleural effusions with bilateral subsegmental atelectasis within both lower lobes. Small hiatal hernia Hepatobiliary: Homogeneous attenuation of the liver with a tiny cyst or hemangioma measuring 3 mm in the right hepatic lobe posteriorly, too small to further characterize. Trace perihepatic fluid. Distended gallbladder without wall thickening nor evidence cholelithiasis. Pancreas: Unremarkable. No pancreatic ductal dilatation or surrounding inflammatory changes. Spleen: Normal in size without focal abnormality. Adrenals/Urinary Tract: Normal bilateral adrenal glands. Mild cortical thinning in the lower poles bilaterally with interpolar left renal 8-9 mm stable complex appearing cyst since 2017 likely a benign finding. Urinary bladder is distended without calculus  or mass. No wall thickening. Stomach/Bowel: Diffuse transmural thickening of large bowel containing stool throughout save for the rectosigmoid. Mild proximal small bowel distention with contrast which may represent changes due to peristalsis or dysmotility. Scattered diverticulosis. The patient is status post appendectomy. Stomach is contracted.  Vascular/Lymphatic: Aortoiliac and branch vessel atherosclerosis. No adenopathy. Reproductive: Status post hysterectomy. No adnexal masses. Other: No abdominal wall hernia or abnormality. No abdominopelvic ascites. Musculoskeletal: Chronic right sacral alar and left parasymphyseal fractures. In etiology. Lower lumbar facet arthropathy is noted. Schmorl's node involving the superior endplate of L3. IMPRESSION: 1. Diffuse mild transmural thickening of large bowel with fecal retention consistent with an inflammatory colitis secondary to retained stool (stercoral colitis). No mechanical bowel obstruction. Mild small bowel ileus or dysmotility accounting for mild distention with contrast. 2. Aortoiliac atherosclerosis.  No aneurysm. 3. Stable 8 mm interpolar left renal cyst. 4. Tiny 3 mm right hepatic hypodensity too small to further characterize but statistically consistent with a cyst or hemangioma. 5. Lumbar spondylosis; chronic right sacral alar and left parasymphyseal fractures. Electronically Signed   By: Kisner Royalty M.D.   On: 01/29/2017 00:13   Dg Abd Acute W/chest  Result Date: 01/30/2017 CLINICAL DATA:  Ileus.  Abdominal pain . EXAM: DG ABDOMEN ACUTE W/ 1V CHEST COMPARISON:  KUB 01/29/2017. Chest x-ray 10/08/2016. CT abdomen 01/30/2017. FINDINGS: Chest x-ray reveals cardiomegaly with pulmonary vascular prominence bilateral interstitial prominence bilateral pleural effusions consistent with CHF. NG tube tip again noted in the upper most portion of the stomach/distal esophagus, advancement of approximately 10 cm suggested. Distended loops of small and large bowel noted suggesting adynamic ileus, slightly improved from prior exam. Oral contrast in the right colon. No free air. Right iliac stent noted. Diffuse osteopenia and degenerative change present. IMPRESSION: 1. Chest x-ray reveals findings of congestive heart failure with pulmonary interstitial edema and bilateral pleural effusions. 2. NG tube noted with  tip in the upper stomach/ GE junction. Advancement of approximately 10 cm suggested. 3. Persistent changes of adynamic ileus, slightly improved from prior exam. Critical Value/emergent results were called by telephone at the time of interpretation on 01/30/2017 at 7:37 am to nurse Roselyn Reef, who verbally acknowledged these results. Electronically Signed   By: Marcello Moores  Register   On: 01/30/2017 07:39     ASSESSMENT AND PLAN:   83 year old female past medical history of spinal stenosis, neuropathy, osteoporosis, hypothyroidism, hypertension, hyperlipidemia, gout, GERD, diabetes, COPD, history of CHF who presented to the hospital due to abdominal pain, nausea vomiting and noted to have CT scan findings suggestive of ileus and stercoral colitis.  1. Ileus/stercoral colitis-secondary to retained stool. Patient presently not having any nausea or vomiting. No drainage noted from the NG tube. -CT scan suggestive of retained stool. Patient took an enema at home prior to coming to the hospital with some minimal response. Dulcolax suppository has helped with 3 BM yesterday. -Continue supportive care with IV fluids, anti-emetics. Appreciated surgical input. - Still have ILeus, better than yesterday.  2. Leukocytosis - due to # 1.  -  Surgery suggested Zosyn IV due to colitis.  3. Hyperlipidemia - cont. Atorvastatin  4. Hypokalemia - due to N/V.  - improved w/ supplementation and will monitor.   5. Neuropathy - cont. Gabapentin.   6. Ac diastolic CHF   Hold IV fluids for now.   IV lasix.   Have duoneb to help with coughing and congestions.  All the records are reviewed and case discussed with Care Management/Social Worker. Management plans discussed  with the patient, family and they are in agreement.  CODE STATUS: DNR  DVT Prophylaxis: Lovenox  TOTAL TIME TAKING CARE OF THIS PATIENT: 30 minutes.   POSSIBLE D/C IN 1-2 DAYS, DEPENDING ON CLINICAL CONDITION.   Vaughan Basta M.D on  01/30/2017 at 9:37 AM  Between 7am to 6pm - Pager - [phone]  After 6pm go to [email or web address] - Proofreader  [facility]  Office  [phone]  CC: Primary care physician; Cyndi Bender, PA-C

## 2017-01-31 ENCOUNTER — Inpatient Hospital Stay: Payer: Medicare Other

## 2017-01-31 MED ORDER — IPRATROPIUM-ALBUTEROL 0.5-2.5 (3) MG/3ML IN SOLN
3.0000 mL | Freq: Three times a day (TID) | RESPIRATORY_TRACT | Status: DC
  Administered 2017-01-31 – 2017-02-03 (×8): 3 mL via RESPIRATORY_TRACT
  Filled 2017-01-31 (×8): qty 3

## 2017-01-31 MED ORDER — METOPROLOL TARTRATE 5 MG/5ML IV SOLN
5.0000 mg | Freq: Four times a day (QID) | INTRAVENOUS | Status: DC
  Administered 2017-01-31 – 2017-02-04 (×13): 5 mg via INTRAVENOUS
  Filled 2017-01-31 (×16): qty 5

## 2017-01-31 MED ORDER — BISACODYL 10 MG RE SUPP
10.0000 mg | Freq: Every day | RECTAL | Status: DC
  Administered 2017-01-31 – 2017-02-03 (×4): 10 mg via RECTAL
  Filled 2017-01-31 (×4): qty 1

## 2017-01-31 MED ORDER — SODIUM CHLORIDE 0.9 % IV SOLN
INTRAVENOUS | Status: DC
  Administered 2017-01-31 – 2017-02-02 (×2): via INTRAVENOUS

## 2017-01-31 NOTE — Progress Notes (Signed)
PT Cancellation Note  Patient Details Name: Tracy Richards MRN: [id number] DOB: 03/17/1929   Cancelled Treatment:    Reason Eval/Treat Not Completed: Patient declined, no reason specified Pt reports that she is interested in having HHPT, but that she did not sleep well last night and does not wish to work with PT today.  Pt adamant about this, reports she will try tomorrow if she can get some rest tonight.  Kreg Shropshire, DPT 01/31/2017, 4:58 PM

## 2017-01-31 NOTE — Progress Notes (Signed)
[facility] at [place] NAME: Tracy Richards    MR#:  [id number]  DATE OF BIRTH:  Jan 25, 1929  SUBJECTIVE:   Patient here due to abdominal pain nausea vomiting and noted to have an ileus and also suspected Constipated and colitis due to retained stool.     Chest x-ray shows pulmonary edema. Patient does complain of cough with secretions.   Had few BM, and xray abd shows improvement in ileus and constipation.  have NGT suction on.  REVIEW OF SYSTEMS:    Review of Systems  Constitutional: Negative for chills and fever.  HENT: Negative for congestion and tinnitus.   Eyes: Negative for blurred vision and double vision.  Respiratory: Positive for cough. Negative for shortness of breath and wheezing.   Cardiovascular: Negative for chest pain, orthopnea and PND.  Gastrointestinal: Negative for diarrhea, nausea and vomiting.  Genitourinary: Negative for dysuria and hematuria.  Neurological: Negative for dizziness, sensory change and focal weakness.  All other systems reviewed and are negative.   Nutrition: NPO Tolerating Diet: No Tolerating PT: Await Eval.    DRUG ALLERGIES:   Allergies  Allergen Reactions  . Amlodipine Swelling  . Avelox [Moxifloxacin Hcl In Nacl] Other (See Comments)    G.I. Upset   . Carafate [Sucralfate] Itching    Ulcers also   . Celecoxib Other (See Comments)    Reaction unknown  . Diazepam Hives  . Doxazosin Other (See Comments)    Reaction unknown  . Klonopin [Clonazepam] Hives  . Levofloxacin Cough    Choking Sensation  . Norvasc [Amlodipine Besylate] Swelling  . Pantoprazole Sodium Itching  . Ranitidine Itching  . Rofecoxib Other (See Comments)    Reaction unknown  . Shellfish Allergy Diarrhea  . Sulfa Antibiotics Other (See Comments)    Mouth Sores  . Sulfa Drugs Cross Reactors Other (See Comments)    Mouth Sores  . Codeine Anxiety    "Makes me crazy"  . Tape Rash and Other (See Comments)    SKIN IS  VERY THIN; TEARS AND BRUISES VERY EASILY!!    VITALS:  Blood pressure (!) 160/89, pulse (!) 107, temperature 98.2 F (36.8 C), temperature source Oral, resp. rate 20, height 5' (1.524 m), weight 61 kg (134 lb 6.4 oz), SpO2 94 %.  PHYSICAL EXAMINATION:   Physical Exam  GENERAL:  83 y.o.-year-old patient lying in bed in some acute distress.  EYES: Pupils equal, round, reactive to light and accommodation. No scleral icterus. Extraocular muscles intact.  HEENT: Head atraumatic, normocephalic. Oropharynx and nasopharynx clear.  NECK:  Supple, no jugular venous distention. No thyroid enlargement, no tenderness.  LUNGS: Normal breath sounds bilaterally, no wheezing, some crepitations. No use of accessory muscles of respiration.    Have some cough with clear secretions. CARDIOVASCULAR: S1, S2 normal. No murmurs, rubs, or gallops.  ABDOMEN: Soft, Tender in the lower abdomen, slightly distended. Hypoactive Bowel sounds. No organomegaly or mass. NG tube with suction in place. EXTREMITIES: No cyanosis, clubbing or edema b/l.    NEUROLOGIC: Cranial nerves II through XII are intact. No focal Motor or sensory deficits b/l.   PSYCHIATRIC: The patient is alert and oriented x 3.  SKIN: No obvious rash, lesion, or ulcer.    LABORATORY PANEL:   CBC  Recent Labs Lab 01/30/17 0410  WBC 12.7*  HGB 11.0*  HCT 33.2*  PLT 225   ------------------------------------------------------------------------------------------------------------------  Chemistries   Recent Labs Lab 01/12/2017 2029  01/30/17 0410  NA 134*  < >  141  K 3.2*  < > 4.1  CL 102  < > 112*  CO2 22  < > 24  GLUCOSE 155*  < > 93  BUN 24*  < > 20  CREATININE 0.93  < > 0.78  CALCIUM 7.5*  < > 7.8*  AST 21  --   --   ALT 10*  --   --   ALKPHOS 84  --   --   BILITOT 0.9  --   --   < > = values in this interval not  displayed. ------------------------------------------------------------------------------------------------------------------  Cardiac Enzymes No results for input(s): TROPONINI in the last 168 hours. ------------------------------------------------------------------------------------------------------------------  RADIOLOGY:  Dg Abd 1 View  Result Date: 01/31/2017 CLINICAL DATA:  Es, history of gastroesophageal reflux. EXAM: ABDOMEN - 1 VIEW COMPARISON:  Abdominal series of January 30, 2017 FINDINGS: The volume of bowel gas has decreased and is now all within the limits of normal. There is contrast within the right colon that has migrated somewhat distally since yesterday's study. A moderate stool burden is present within the right colon with some in the left colon. No free extraluminal gas collections are observed. The esophagogastric tube tip is just within the region of the GE junction with the proximal port likely lying above the GE junction. There are degenerative changes of the lumbar spine. There is a stent in the right common iliac artery. There are old post traumatic changes of the parasymphyseal portion of the left pubic bone. IMPRESSION: Interval improvement in the bowel gas pattern with near total resolution of the suspected ileus. Moderately increased colonic stool burden especially in the right colon. Electronically Signed   By: David  Martinique M.D.   On: 01/31/2017 10:09   Dg Abd Acute W/chest  Result Date: 01/30/2017 CLINICAL DATA:  Ileus.  Abdominal pain . EXAM: DG ABDOMEN ACUTE W/ 1V CHEST COMPARISON:  KUB 01/29/2017. Chest x-ray 10/08/2016. CT abdomen 01/10/2017. FINDINGS: Chest x-ray reveals cardiomegaly with pulmonary vascular prominence bilateral interstitial prominence bilateral pleural effusions consistent with CHF. NG tube tip again noted in the upper most portion of the stomach/distal esophagus, advancement of approximately 10 cm suggested. Distended loops of small and large bowel  noted suggesting adynamic ileus, slightly improved from prior exam. Oral contrast in the right colon. No free air. Right iliac stent noted. Diffuse osteopenia and degenerative change present. IMPRESSION: 1. Chest x-ray reveals findings of congestive heart failure with pulmonary interstitial edema and bilateral pleural effusions. 2. NG tube noted with tip in the upper stomach/ GE junction. Advancement of approximately 10 cm suggested. 3. Persistent changes of adynamic ileus, slightly improved from prior exam. Critical Value/emergent results were called by telephone at the time of interpretation on 01/30/2017 at 7:37 am to nurse Roselyn Reef, who verbally acknowledged these results. Electronically Signed   By: Marcello Moores  Register   On: 01/30/2017 07:39     ASSESSMENT AND PLAN:   83 year old female past medical history of spinal stenosis, neuropathy, osteoporosis, hypothyroidism, hypertension, hyperlipidemia, gout, GERD, diabetes, COPD, history of CHF who presented to the hospital due to abdominal pain, nausea vomiting and noted to have CT scan findings suggestive of ileus and stercoral colitis.  1. Ileus/stercoral colitis-secondary to retained stool. Patient presently not having any nausea or vomiting. No drainage noted from the NG tube. -CT scan suggestive of retained stool. Patient took an enema at home prior to coming to the hospital with some minimal response. Dulcolax suppository has helped with 3 BM yesterday. -Continue supportive care with IV fluids,  anti-emetics. Appreciated surgical input. - Ileus improving as per x ray KUB.   Further plan as per surgical team about resuming diet.  2. Leukocytosis - due to # 1.  -  Surgery suggested Zosyn IV due to colitis.  3. Hyperlipidemia - cont. Atorvastatin  4. Hypokalemia - due to N/V.  - improved w/ supplementation and will monitor.   5. Neuropathy - cont. Gabapentin.   6. Ac diastolic CHF   Hold IV fluids for now.   IV lasix.   Have duoneb to help  with coughing and congestions.   Improved, give slow rate IV fluids as pt is NPO.  All the records are reviewed and case discussed with Care Management/Social Worker. Management plans discussed with the patient, family and they are in agreement.  CODE STATUS: DNR  DVT Prophylaxis: Lovenox  TOTAL TIME TAKING CARE OF THIS PATIENT: 30 minutes.   POSSIBLE D/C IN 1-2 DAYS, DEPENDING ON CLINICAL CONDITION.   Vaughan Basta M.D on 01/31/2017 at 6:31 PM  Between 7am to 6pm - Pager - [phone]  After 6pm go to [email or web address] - Proofreader  [facility]  Office  [phone]  CC: Primary care physician; Cyndi Bender, PA-C

## 2017-01-31 NOTE — Progress Notes (Signed)
SURGICAL PROGRESS NOTE (cpt [phone])  Hospital Day(s): 2.   Post op day(s):  [facility] Kitchen   Interval History: Patient seen and examined, no acute events or new complaints overnight, though patient is significantly more disoriented and confused this morning. Patient reports additional BM + flatus with less abdominal pain, denies N/V, fever/chills, CP, or SOB.  Review of Systems:  Constitutional: denies fever, chills  HEENT: denies cough or congestion  Respiratory: denies any shortness of breath  Cardiovascular: denies chest pain or palpitations  Gastrointestinal: abdominal pain, N/V, and bowel function as per interval history Genitourinary: denies burning with urination or urinary frequency Musculoskeletal: denies pain, decreased motor or sensation Integumentary: denies any other rashes or skin discolorations Neurological: denies HA or vision/hearing changes   Vital signs in last 24 hours: [min-max] current  Temp:  [98.2 F (36.8 C)-98.5 F (36.9 C)] 98.5 F (36.9 C) (05/01 0058) Pulse Rate:  [90-119] 114 (05/01 0058) Resp:  [18-20] 18 (05/01 0058) BP: (121-149)/(69-88) 148/88 (05/01 0058) SpO2:  [90 %-92 %] 92 % (05/01 0213)     Height: 5' (152.4 cm) Weight: 134 lb 6.4 oz (61 kg) BMI (Calculated): 27.4   Intake/Output this shift:  No intake/output data recorded.   Intake/Output last 2 shifts:  @IOLAST2SHIFTS @   Physical Exam:  Constitutional: alert, cooperative and no distress  HENT: normocephalic without obvious abnormality  Eyes: PERRL, EOM's grossly intact and symmetric  Neuro: CN II - XII grossly intact and symmetric without deficit  Respiratory: breathing non-labored at rest  Cardiovascular: regular rate and sinus rhythm  Gastrointestinal: soft, minimal tender, and non-distended  Musculoskeletal: UE and LE FROM, motor and sensation grossly intact, NT   Labs:  CBC:  Lab Results  Component Value Date   WBC 12.7 (H) 01/30/2017   RBC 3.66 (L) 01/30/2017   BMP:  Lab  Results  Component Value Date   GLUCOSE 93 01/30/2017   GLUCOSE 106 (H) 01/20/2015   CO2 24 01/30/2017   CO2 23 01/20/2015   BUN 20 01/30/2017   BUN 9 01/20/2015   CREATININE 0.78 01/30/2017   CREATININE 0.71 01/20/2015   CALCIUM 7.8 (L) 01/30/2017   CALCIUM 7.9 (L) 01/20/2015     Imaging studies: No new pertinent imaging studies   Assessment/Plan: (ICD-10's: K59.01, K56.7) 83 y.o. female with improving abdominal pain associated with severe constipation and adynamic ileus, complicated by transverse colitis with improved leukocytosis and pertinent comorbidities including DM with peripheral neuropathy, HTN, HLD, COPD, PAD, hypothyroidism, GERD, osteoporosis, renal artery stenosis, and major depression disorder.              - remove NG tube and start clear liquids diet  - bowel regimen including Mg hydroxide/citrate, Senna prn             - complete course of antibiotics for transverse colitis, may convert to oral when tolerating PO             - will likely benefit from routine/scheduled bowel regimen including stool softeners at discharge             - medical management of comorbidities as per primary medical team             - currently no indication for surgical intervention at this time             - ambulation with assist vs frequent repositioning  All of the above findings and recommendations were discussed with the patient and patient's RN, and all of patient's questions  were answered to her expressed satisfaction.  Thank you for the opportunity to participate in this patient's care.  -- [name], MD, [place]: [facility] - Partnering for exceptional care. Office: [phone]

## 2017-01-31 DEATH — deceased

## 2017-02-01 ENCOUNTER — Inpatient Hospital Stay: Payer: Medicare Other

## 2017-02-01 LAB — BASIC METABOLIC PANEL
ANION GAP: 10 (ref 5–15)
BUN: 14 mg/dL (ref 6–20)
CHLORIDE: 112 mmol/L — AB (ref 101–111)
CO2: 24 mmol/L (ref 22–32)
Calcium: 8 mg/dL — ABNORMAL LOW (ref 8.9–10.3)
Creatinine, Ser: 0.68 mg/dL (ref 0.44–1.00)
GFR calc non Af Amer: >60 mL/min (ref >60)
Glucose, Bld: 124 mg/dL — ABNORMAL HIGH (ref 65–99)
POTASSIUM: 3.3 mmol/L — AB (ref 3.5–5.1)
SODIUM: 146 mmol/L — AB (ref 135–145)

## 2017-02-01 LAB — CBC
HCT: 34.5 % — ABNORMAL LOW (ref 35.0–47.0)
HEMOGLOBIN: 11.5 g/dL — AB (ref 12.0–16.0)
MCH: 30.3 pg (ref 26.0–34.0)
MCHC: 33.4 g/dL (ref 32.0–36.0)
MCV: 90.7 fL (ref 80.0–100.0)
Platelets: 267 10*3/uL (ref 150–440)
RBC: 3.81 MIL/uL (ref 3.80–5.20)
RDW: 14.1 % (ref 11.5–14.5)
WBC: 11.1 10*3/uL — ABNORMAL HIGH (ref 3.6–11.0)

## 2017-02-01 MED ORDER — POTASSIUM CHLORIDE 20 MEQ PO PACK
20.0000 meq | PACK | Freq: Two times a day (BID) | ORAL | Status: DC
  Administered 2017-02-01 – 2017-02-02 (×3): 20 meq via ORAL
  Filled 2017-02-01 (×3): qty 1

## 2017-02-01 MED ORDER — MAGNESIUM HYDROXIDE 400 MG/5ML PO SUSP
30.0000 mL | Freq: Every day | ORAL | Status: AC
  Administered 2017-02-01 – 2017-02-03 (×3): 30 mL via ORAL
  Filled 2017-02-01 (×3): qty 30

## 2017-02-01 MED ORDER — SENNOSIDES-DOCUSATE SODIUM 8.6-50 MG PO TABS
1.0000 | ORAL_TABLET | Freq: Once | ORAL | Status: DC
  Filled 2017-02-01: qty 1

## 2017-02-01 NOTE — Progress Notes (Signed)
PT Attempt Note  Patient Details Name: Tracy Richards MRN: [id number] DOB: July 18, 1929   Evaluation Attempt:    Reason Eval/Treat Not Completed: Patient declined, no reason specified. Chart reviewed. Attempted to see patient however she just finished vomiting and is still have nausea. Pt currently refuses. Attempted to provided gentle encouragement but continues to adamantly refuse. Will attempt PT evaluation on later date/time as pt is willing to participate.   Lyndel Safe Huprich PT, DPT   Huprich,Jason 02/01/2017, 11:58 AM

## 2017-02-01 NOTE — Progress Notes (Signed)
[facility] [place] NAME: Tracy Richards    MR#:  [id number]  DATE OF BIRTH:  13-May-1929  SUBJECTIVE:   Patient here due to abdominal pain nausea vomiting and noted to have an ileus and also suspected Constipated and colitis due to retained stool.     Chest x-ray shows pulmonary edema. Patient does complain of cough with secretions.   Had few BM, and xray abd shows improvement in ileus and constipation.  Seen in morning, and was not having any complains- surgery suggested to d/c NGT and start liquids orally.  REVIEW OF SYSTEMS:    Review of Systems  Constitutional: Negative for chills and fever.  HENT: Negative for congestion and tinnitus.   Eyes: Negative for blurred vision and double vision.  Respiratory: Positive for cough. Negative for shortness of breath and wheezing.   Cardiovascular: Negative for chest pain, orthopnea and PND.  Gastrointestinal: Negative for diarrhea, nausea and vomiting.  Genitourinary: Negative for dysuria and hematuria.  Neurological: Negative for dizziness, sensory change and focal weakness.  All other systems reviewed and are negative.   Nutrition: NPO Tolerating Diet: No Tolerating PT: Await Eval.    DRUG ALLERGIES:   Allergies  Allergen Reactions  . Amlodipine Swelling  . Avelox [Moxifloxacin Hcl In Nacl] Other (See Comments)    G.I. Upset   . Carafate [Sucralfate] Itching    Ulcers also   . Celecoxib Other (See Comments)    Reaction unknown  . Diazepam Hives  . Doxazosin Other (See Comments)    Reaction unknown  . Klonopin [Clonazepam] Hives  . Levofloxacin Cough    Choking Sensation  . Norvasc [Amlodipine Besylate] Swelling  . Pantoprazole Sodium Itching  . Ranitidine Itching  . Rofecoxib Other (See Comments)    Reaction unknown  . Shellfish Allergy Diarrhea  . Sulfa Antibiotics Other (See Comments)    Mouth Sores  . Sulfa Drugs Cross Reactors Other (See Comments)    Mouth Sores  .  Codeine Anxiety    "Makes me crazy"  . Tape Rash and Other (See Comments)    SKIN IS VERY THIN; TEARS AND BRUISES VERY EASILY!!    VITALS:  Blood pressure (!) 146/98, pulse (!) 123, temperature 98.3 F (36.8 C), temperature source Oral, resp. rate 16, height 5' (1.524 m), weight 61 kg (134 lb 6.4 oz), SpO2 92 %.  PHYSICAL EXAMINATION:   Physical Exam  GENERAL:  83 y.o.-year-old patient lying in bed in some acute distress.  EYES: Pupils equal, round, reactive to light and accommodation. No scleral icterus. Extraocular muscles intact.  HEENT: Head atraumatic, normocephalic. Oropharynx and nasopharynx clear.  NECK:  Supple, no jugular venous distention. No thyroid enlargement, no tenderness.  LUNGS: Normal breath sounds bilaterally, no wheezing, some crepitations. No use of accessory muscles of respiration.    Have some cough with clear secretions. CARDIOVASCULAR: S1, S2 normal. No murmurs, rubs, or gallops.  ABDOMEN: Soft, Tender in the lower abdomen, slightly distended. normal Bowel sounds. No organomegaly or mass.  EXTREMITIES: No cyanosis, clubbing or edema b/l.    NEUROLOGIC: Cranial nerves II through XII are intact. No focal Motor or sensory deficits b/l.   PSYCHIATRIC: The patient is alert and oriented x 3.  SKIN: No obvious rash, lesion, or ulcer.    LABORATORY PANEL:   CBC  Recent Labs Lab 02/01/17 0422  WBC 11.1*  HGB 11.5*  HCT 34.5*  PLT 267   ------------------------------------------------------------------------------------------------------------------  Chemistries   Recent Labs  Lab 01/16/2017 2029  02/01/17 0422  NA 134*  < > 146*  K 3.2*  < > 3.3*  CL 102  < > 112*  CO2 22  < > 24  GLUCOSE 155*  < > 124*  BUN 24*  < > 14  CREATININE 0.93  < > 0.68  CALCIUM 7.5*  < > 8.0*  AST 21  --   --   ALT 10*  --   --   ALKPHOS 84  --   --   BILITOT 0.9  --   --   < > = values in this interval not  displayed. ------------------------------------------------------------------------------------------------------------------  Cardiac Enzymes No results for input(s): TROPONINI in the last 168 hours. ------------------------------------------------------------------------------------------------------------------  RADIOLOGY:  Dg Abd 1 View  Result Date: 01/31/2017 CLINICAL DATA:  Es, history of gastroesophageal reflux. EXAM: ABDOMEN - 1 VIEW COMPARISON:  Abdominal series of January 30, 2017 FINDINGS: The volume of bowel gas has decreased and is now all within the limits of normal. There is contrast within the right colon that has migrated somewhat distally since yesterday's study. A moderate stool burden is present within the right colon with some in the left colon. No free extraluminal gas collections are observed. The esophagogastric tube tip is just within the region of the GE junction with the proximal port likely lying above the GE junction. There are degenerative changes of the lumbar spine. There is a stent in the right common iliac artery. There are old post traumatic changes of the parasymphyseal portion of the left pubic bone. IMPRESSION: Interval improvement in the bowel gas pattern with near total resolution of the suspected ileus. Moderately increased colonic stool burden especially in the right colon. Electronically Signed   By: David  Martinique M.D.   On: 01/31/2017 10:09     ASSESSMENT AND PLAN:   83 year old female past medical history of spinal stenosis, neuropathy, osteoporosis, hypothyroidism, hypertension, hyperlipidemia, gout, GERD, diabetes, COPD, history of CHF who presented to the hospital due to abdominal pain, nausea vomiting and noted to have CT scan findings suggestive of ileus and stercoral colitis.  1. Ileus/stercoral colitis-secondary to retained stool. Patient presently not having any nausea or vomiting. No drainage noted from the NG tube. -CT scan suggestive of  retained stool. Patient took an enema at home prior to coming to the hospital with some minimal response. Dulcolax suppository has helped with 3 BM yesterday. -Continue supportive care with IV fluids, anti-emetics. Appreciated surgical input. - Ileus improving as per x ray KUB.  Surgery told- to d/c NGT and start feeding liquids.   Started today, monitor.  2. Leukocytosis - due to # 1.  -  Surgery suggested Zosyn IV due to colitis.  3. Hyperlipidemia - cont. Atorvastatin  4. Hypokalemia - due to N/V.  - improved w/ supplementation and will monitor.   5. Neuropathy - cont. Gabapentin.   6. Ac diastolic CHF   Held IV fluids for 1 day.   IV lasix.   Have duoneb to help with coughing and congestions.   Improved, give slow rate IV fluids as pt is NPO.  All the records are reviewed and case discussed with Care Management/Social Worker. Management plans discussed with the patient, family and they are in agreement.  CODE STATUS: DNR  DVT Prophylaxis: Lovenox  TOTAL TIME TAKING CARE OF THIS PATIENT: 30 minutes.   POSSIBLE D/C IN 1-2 DAYS, DEPENDING ON CLINICAL CONDITION.   Vaughan Basta M.D on 02/01/2017 at 5:19 PM  Between 7am to  6pm - Pager - [phone]  After 6pm go to [email or web address] - Proofreader  [facility]  Office  [phone]  CC: Primary care physician; Cyndi Bender, PA-C

## 2017-02-01 NOTE — Plan of Care (Signed)
NG suction stopped to admin milk of mag.

## 2017-02-01 NOTE — Progress Notes (Signed)
SURGICAL PROGRESS NOTE (cpt [phone])  Hospital Day(s): 3.   Post op day(s):  Marland Kitchen   Interval History: Patient seen and examined, no acute events or new complaints overnight. Patient reports further improved abdominal pain with passage of additional flatus and BM's, tolerated a rather large amount of clear liquids this morning, after which she reports some nausea that has since resolved, and she otherwise denies any fever/chills, CP, or SOB.  Review of Systems:  Constitutional: denies fever, chills  HEENT: denies cough or congestion  Respiratory: denies any shortness of breath  Cardiovascular: denies chest pain or palpitations  Gastrointestinal: abdominal pain, N/V, and bowel function as per interval history Genitourinary: denies burning with urination or urinary frequency Musculoskeletal: denies pain, decreased motor or sensation Integumentary: denies any other rashes or skin discolorations Neurological: denies HA or vision/hearing changes   Vital signs in last 24 hours: [min-max] current  Temp:  [98.2 F (36.8 C)-98.6 F (37 C)] 98.6 F (37 C) (05/01 2332) Pulse Rate:  [107-128] 128 (05/02 0637) Resp:  [19-20] 19 (05/01 2332) BP: (125-169)/(74-102) 125/102 (05/02 0637) SpO2:  [90 %-94 %] 92 % (05/01 2332)     Height: 5' (152.4 cm) Weight: 134 lb 6.4 oz (61 kg) BMI (Calculated): 27.4   Intake/Output this shift:  No intake/output data recorded.   Intake/Output last 2 shifts:  @IOLAST2SHIFTS @   Physical Exam:  Constitutional: alert, cooperative and no distress  HENT: normocephalic without obvious abnormality  Eyes: PERRL, EOM's grossly intact and symmetric  Neuro: CN II - XII grossly intact and symmetric without deficit  Respiratory: breathing non-labored at rest  Cardiovascular: regular rate and sinus rhythm  Gastrointestinal: soft, minimal upper abdominal tenderness to deep palpation with mild distention (though baseline unclear)  Musculoskeletal: UE and LE FROM, no edema or  wounds, motor and sensation grossly intact, NT   Labs:  CBC:  Lab Results  Component Value Date   WBC 11.1 (H) 02/01/2017   RBC 3.81 02/01/2017   BMP:  Lab Results  Component Value Date   GLUCOSE 124 (H) 02/01/2017   GLUCOSE 106 (H) 01/20/2015   CO2 24 02/01/2017   CO2 23 01/20/2015   BUN 14 02/01/2017   BUN 9 01/20/2015   CREATININE 0.68 02/01/2017   CREATININE 0.71 01/20/2015   CALCIUM 8.0 (L) 02/01/2017   CALCIUM 7.9 (L) 01/20/2015     Imaging studies: No new pertinent imaging studies   Assessment/Plan:(ICD-10's: K59.01, K56.7) 83 y.o.femalewith improving abdominal pain associated with severe constipation and adynamic ileus, complicated by transverse colitis with improved leukocytosis andpertinent comorbidities including DM with peripheral neuropathy, HTN, HLD, COPD, PAD, hypothyroidism, GERD, osteoporosis, renal artery stenosis, and major depression disorder.  - continue clear liquids for now, slowly advance tomorrow             - bowel regimen including Mg hydroxide/citrate, Sennaprn +/- as per medical team - complete course of PO antibiotics for transverse colitis, attributed to severe constipation - will likely benefit from routine/scheduled bowel regimen including stool softeners at discharge - medical management of comorbidities as per primary medical team - currently no indication for surgical intervention at this time - ambulation with assist vs frequent repositioning  All of the above findings and recommendations were discussed with the patient, patient's family, and patient's RN, and all of patient's and her family's questions were answered to their expressed satisfaction.  Thank you for the opportunity to participate in this patient's care.  -- [name], MD, [place]: [facility] - Partnering for exceptional care. Office:  [phone]

## 2017-02-02 ENCOUNTER — Inpatient Hospital Stay: Payer: Medicare Other

## 2017-02-02 MED ORDER — LEVOTHYROXINE SODIUM 100 MCG PO TABS
100.0000 ug | ORAL_TABLET | Freq: Every day | ORAL | Status: DC
  Administered 2017-02-04: 100 ug via ORAL
  Filled 2017-02-02 (×2): qty 1

## 2017-02-02 MED ORDER — DILTIAZEM HCL ER COATED BEADS 240 MG PO CP24
240.0000 mg | ORAL_CAPSULE | Freq: Every day | ORAL | Status: DC
  Administered 2017-02-02 – 2017-02-03 (×2): 240 mg via ORAL
  Filled 2017-02-02 (×2): qty 1

## 2017-02-02 MED ORDER — SORBITOL 70 % SOLN
960.0000 mL | TOPICAL_OIL | Freq: Once | ORAL | Status: AC
  Administered 2017-02-02: 960 mL via RECTAL
  Filled 2017-02-02 (×6): qty 240

## 2017-02-02 MED ORDER — POTASSIUM CHLORIDE CRYS ER 20 MEQ PO TBCR
20.0000 meq | EXTENDED_RELEASE_TABLET | Freq: Two times a day (BID) | ORAL | Status: DC
  Administered 2017-02-02 – 2017-02-03 (×3): 20 meq via ORAL
  Filled 2017-02-02 (×3): qty 1

## 2017-02-02 MED ORDER — DOCUSATE SODIUM 100 MG PO CAPS
100.0000 mg | ORAL_CAPSULE | Freq: Two times a day (BID) | ORAL | Status: DC
  Administered 2017-02-02 – 2017-02-03 (×3): 100 mg via ORAL
  Filled 2017-02-02 (×3): qty 1

## 2017-02-02 NOTE — Progress Notes (Signed)
Nutrition Follow-up  DOCUMENTATION CODES:   Not applicable  INTERVENTION:  1. Patient has been NPO/CLD x6 days now. Was consuming some orange juice this morning. Monitor for diet advancement per MD/NP/PA vs need for nutrition support.  NUTRITION DIAGNOSIS:   Inadequate oral intake related to inability to eat as evidenced by NPO status. -resolving  GOAL:   Patient will meet greater than or equal to 90% of their needs -not meeting  MONITOR:   I & O's, Labs, Weight trends, Diet advancement  REASON FOR ASSESSMENT:   Malnutrition Screening Tool    ASSESSMENT:   Tracy Richards  is a 83 y.o. female with a known history of Bronchial asthma, congestive heart failure, COPD, GERD, hyperlipidemia, hypertension presented to the emergency room with abdominal pain for the last 2 days  Not in good spirits today Wanted tea to drink instead of orange juice - Is hungry per patient and daughter. No new weights. Vomited last night possibly related to secretions Ileus the same on Xray Labs and medications reviewed: K 3.3 Colace, Dulcolax, Milk of Magnesia, KCL, Senokot-S NS @ 38mL/hr  Diet Order:  Diet clear liquid Room service appropriate? Yes; Fluid consistency: Thin  Skin:  Reviewed, no issues  Last BM:  01/29/2017  Height:   Ht Readings from Last 1 Encounters:  01/05/2017 5' (1.524 m)    Weight:   Wt Readings from Last 1 Encounters:  01/29/17 134 lb 6.4 oz (61 kg)    Ideal Body Weight:  45.45 kg  BMI:  Body mass index is 26.25 kg/m.  Estimated Nutritional Needs:   Kcal:  7782-4235 caloires  Protein:  61-73 gm  Fluid:  >/= 1.5L  EDUCATION NEEDS:   No education needs identified at this time  Tracy Anis. Edrik Rundle, MS, RD LDN Inpatient Clinical Dietitian Pager [phone]

## 2017-02-02 NOTE — Clinical Social Work Placement (Signed)
   CLINICAL SOCIAL WORK PLACEMENT  NOTE  Date:  02/02/2017  Patient Details  Name: Tracy Richards MRN: [id number] Date of Birth: [birth year]  Clinical Social Work is seeking post-discharge placement for this patient at the [facility] level of care (*CSW will initial, date and re-position this form in  chart as items are completed):  Yes   Patient/family provided with [facility] Work Department's list of facilities offering this level of care within the geographic area requested by the patient (or if unable, by the patient's family).  Yes   Patient/family informed of their freedom to choose among providers that offer the needed level of care, that participate in Medicare, Medicaid or managed care program needed by the patient, have an available bed and are willing to accept the patient.  Yes   Patient/family informed of [facility]'s ownership interest in [facility] and [facility], as well as of the fact that they are under no obligation to receive care at these facilities.  PASRR submitted to EDS on       PASRR number received on       Existing PASRR number confirmed on 02/02/17     FL2 transmitted to all facilities in geographic area requested by pt/family on 02/02/17     FL2 transmitted to all facilities within larger geographic area on       Patient informed that his/her managed care company has contracts with or will negotiate with certain facilities, including the following:            Patient/family informed of bed offers received.  Patient chooses bed at       Physician recommends and patient chooses bed at      Patient to be transferred to   on  .  Patient to be transferred to facility by       Patient family notified on   of transfer.  Name of family member notified:        PHYSICIAN       Additional Comment:    _______________________________________________ Thales Knipple, Veronia Beets, LCSW 02/02/2017, 3:26 PM

## 2017-02-02 NOTE — Progress Notes (Signed)
While rounding the unit [facility] visited Pt. Pt was sitting on the chair watching Tv with her aide assistant at the bedside. Pt stated she was not feeling well. Pt requested for prayers to cope with her illness. [facility] offered prayers for Pt and for her aide. [facility] will have a follow up with this Pt as needed.     02/02/17 1400  Clinical Encounter Type  Visited With Patient;Health care provider  Visit Type Initial;Spiritual support  Referral From [place]

## 2017-02-02 NOTE — Progress Notes (Signed)
Patient without urine output x 8 hours. Bladder scan revealed 410 ml of urine. MD paged and new orders received for I/O cath x 1. Patient catheterized and tolerated well. 350 ml of urine removed from bladder. New order placed for bladder scan q shift if no urine output.

## 2017-02-02 NOTE — Progress Notes (Signed)
[facility] [place] NAME: Tracy Richards    MR#:  [id number]  DATE OF BIRTH:  Oct 01, 1929  SUBJECTIVE:   Patient here due to abdominal pain nausea vomiting and noted to have an ileus and also suspected Constipated and colitis due to retained stool.     Chest x-ray shows pulmonary edema. Patient does complain of cough with secretions.   Had few BM, and xray abd shows improvement in ileus and constipation.  Seen in morning, and was not having any complains- surgery suggested to d/c NGT and start liquids orally.   Pt had vomiting twice yesterday, today asking again to eat.  REVIEW OF SYSTEMS:    Review of Systems  Constitutional: Negative for chills and fever.  HENT: Negative for congestion and tinnitus.   Eyes: Negative for blurred vision and double vision.  Respiratory: Positive for cough. Negative for shortness of breath and wheezing.   Cardiovascular: Negative for chest pain, orthopnea and PND.  Gastrointestinal: Negative for diarrhea, nausea and vomiting.  Genitourinary: Negative for dysuria and hematuria.  Neurological: Negative for dizziness, sensory change and focal weakness.  All other systems reviewed and are negative.   Nutrition: NPO Tolerating Diet: No Tolerating PT: Await Eval.    DRUG ALLERGIES:   Allergies  Allergen Reactions  . Amlodipine Swelling  . Avelox [Moxifloxacin Hcl In Nacl] Other (See Comments)    G.I. Upset   . Carafate [Sucralfate] Itching    Ulcers also   . Celecoxib Other (See Comments)    Reaction unknown  . Diazepam Hives  . Doxazosin Other (See Comments)    Reaction unknown  . Klonopin [Clonazepam] Hives  . Levofloxacin Cough    Choking Sensation  . Norvasc [Amlodipine Besylate] Swelling  . Pantoprazole Sodium Itching  . Ranitidine Itching  . Rofecoxib Other (See Comments)    Reaction unknown  . Shellfish Allergy Diarrhea  . Sulfa Antibiotics Other (See Comments)    Mouth Sores  . Sulfa  Drugs Cross Reactors Other (See Comments)    Mouth Sores  . Codeine Anxiety    "Makes me crazy"  . Tape Rash and Other (See Comments)    SKIN IS VERY THIN; TEARS AND BRUISES VERY EASILY!!    VITALS:  Blood pressure (!) 144/90, pulse 87, temperature 97.5 F (36.4 C), temperature source Oral, resp. rate 18, height 5' (1.524 m), weight 61 kg (134 lb 6.4 oz), SpO2 97 %.  PHYSICAL EXAMINATION:   Physical Exam  GENERAL:  83 y.o.-year-old patient lying in bed in some acute distress.  EYES: Pupils equal, round, reactive to light and accommodation. No scleral icterus. Extraocular muscles intact.  HEENT: Head atraumatic, normocephalic. Oropharynx and nasopharynx clear.  NECK:  Supple, no jugular venous distention. No thyroid enlargement, no tenderness.  LUNGS: Normal breath sounds bilaterally, no wheezing, some crepitations. No use of accessory muscles of respiration.    Have some cough with clear secretions. CARDIOVASCULAR: S1, S2 normal. No murmurs, rubs, or gallops.  ABDOMEN: Soft, Tender in the lower abdomen, slightly distended. normal Bowel sounds. No organomegaly or mass.  EXTREMITIES: No cyanosis, clubbing or edema b/l.    NEUROLOGIC: Cranial nerves II through XII are intact. No focal Motor or sensory deficits b/l.   PSYCHIATRIC: The patient is alert and oriented x 3.  SKIN: No obvious rash, lesion, or ulcer.    LABORATORY PANEL:   CBC  Recent Labs Lab 02/01/17 0422  WBC 11.1*  HGB 11.5*  HCT 34.5*  PLT 267   ------------------------------------------------------------------------------------------------------------------  Chemistries   Recent Labs Lab 01/11/2017 2029  02/01/17 0422  NA 134*  < > 146*  K 3.2*  < > 3.3*  CL 102  < > 112*  CO2 22  < > 24  GLUCOSE 155*  < > 124*  BUN 24*  < > 14  CREATININE 0.93  < > 0.68  CALCIUM 7.5*  < > 8.0*  AST 21  --   --   ALT 10*  --   --   ALKPHOS 84  --   --   BILITOT 0.9  --   --   < > = values in this interval not  displayed. ------------------------------------------------------------------------------------------------------------------  Cardiac Enzymes No results for input(s): TROPONINI in the last 168 hours. ------------------------------------------------------------------------------------------------------------------  RADIOLOGY:  Dg Abd 1 View  Result Date: 02/02/2017 CLINICAL DATA:  Ileus EXAM: ABDOMEN - 1 VIEW COMPARISON:  02/01/2017 FINDINGS: Moderate gaseous distention of large and small bowel again noted, unchanged. Moderate stool throughout the colon. Findings most compatible with ileus, unchanged. No free air organomegaly. IMPRESSION: Stable ileus pattern. Electronically Signed   By: Rolm Baptise M.D.   On: 02/02/2017 08:38   Dg Abd 1 View  Result Date: 02/01/2017 CLINICAL DATA:  Abdominal pain EXAM: ABDOMEN - 1 VIEW COMPARISON:  COMPARISON 01/31/2017 FINDINGS: Scattered large and small bowel gas is noted. The degree of air within the colon is increased in the interval from the prior exam. No obstructive changes are seen. No free air is noted. The nasogastric catheter seen previously is no longer identified. Diffuse aortic calcifications are seen. No acute bony abnormality is noted. IMPRESSION: No obstructive changes are noted. The degree of air suggests a generalized ileus. Electronically Signed   By: Inez Catalina M.D.   On: 02/01/2017 18:16     ASSESSMENT AND PLAN:   83 year old female past medical history of spinal stenosis, neuropathy, osteoporosis, hypothyroidism, hypertension, hyperlipidemia, gout, GERD, diabetes, COPD, history of CHF who presented to the hospital due to abdominal pain, nausea vomiting and noted to have CT scan findings suggestive of ileus and stercoral colitis.  1. Ileus/stercoral colitis-secondary to retained stool. Patient presently not having any nausea or vomiting. No drainage noted from the NG tube. -CT scan suggestive of retained stool. Patient took an enema at  home prior to coming to the hospital with some minimal response. Dulcolax suppository has helped with 3 BM yesterday. -Continue supportive care with IV fluids, anti-emetics. Appreciated surgical input. - Ileus improving as per x ray KUB.  Surgery told- to d/c NGT and start feeding liquids.   Pt had some vomiting with that, advised to have small amount of liquids at a time.    She understands and will try today, Xray shows no changes.  2. Leukocytosis - due to # 1.  -  Surgery suggested Zosyn IV due to colitis.  3. Hyperlipidemia - cont. Atorvastatin  4. Hypokalemia - due to N/V.  - improved w/ supplementation and will monitor.   5. Neuropathy - cont. Gabapentin.   6. Ac diastolic CHF   Held IV fluids for 1 day.   IV lasix.   Have duoneb to help with coughing and congestions.   Improved, give slow rate IV fluids as pt is NPO.  * A fib   Resume cardizem CD oral.   In past pt had refused for anticoagulant to Dr. Saralyn Pilar due to frequent falls.  * hypothyroidism   Resume levothyroxine.  All the records are reviewed  and case discussed with Care Management/Social Worker. Management plans discussed with the patient, family and they are in agreement.  CODE STATUS: DNR  DVT Prophylaxis: Lovenox  TOTAL TIME TAKING CARE OF THIS PATIENT: 30 minutes.   POSSIBLE D/C IN 1-2 DAYS, DEPENDING ON CLINICAL CONDITION.   Vaughan Basta M.D on 02/02/2017 at 12:59 PM  Between 7am to 6pm - Pager - [phone]  After 6pm go to [email or web address] - Proofreader  [facility]  Office  [phone]  CC: Primary care physician; Cyndi Bender, PA-C

## 2017-02-02 NOTE — Clinical Social Work Note (Signed)
Clinical Social Work Assessment  Patient Details  Name: Tracy Richards MRN: [id number] Date of Birth: 06/08/1929  Date of referral:  02/02/17               Reason for consult:  Facility Placement                Permission sought to share information with:  Facility Contact Representative Permission granted to share information::  Yes, Verbal Permission Granted  Name::      Skilled Nursing Facility   Agency::   [facility]   Relationship::     Contact Information:     Housing/Transportation Living arrangements for the past 2 months:  Single Family Home Source of Information:  Adult Children Patient Interpreter Needed:  None Criminal Activity/Legal Involvement Pertinent to Current Situation/Hospitalization:  No - Comment as needed Significant Relationships:  Adult Children Lives with:  Adult Children Do you feel safe going back to the place where you live?  Yes Need for family participation in patient care:  Yes (Comment)  Care giving concerns:  Patient lives in [place], [place] with her daughter Bonnie Greeson and son in Law RB Greeson.    Social Worker assessment / plan:  Clinical Social Worker (CSW) reviewed chart and noted that PT is recommending SNF. CSW attempted to meet with patient however she was asleep and per chart she is not alert and oriented. CSW contacted patient's daughter Bonnie Greeson to discuss D/C plan. Per daughter patient lives with her and her husband in [place]. CSW explained SNF process and that patient has met the 3 night stay criteria as she was admitted on 01/29/17. Per daughter patient has been to rehab at [facility], [facility] and [facility]. Per daughter the last time patient was in rehab it was at [facility] in Oct. 2017. Daughter is agreeable to SNF search in [place] and prefers [facility]. FL2 complete and faxed out. CSW will continue to follow and assist as needed.   Employment status:  Retired Insurance information:  Medicare PT  Recommendations:  Skilled Nursing Facility Information / Referral to community resources:  Skilled Nursing Facility  Patient/Family's Response to care:  Patient's daughter is agreeable to SNF search and prefers [facility].   Patient/Family's Understanding of and Emotional Response to Diagnosis, Current Treatment, and Prognosis:  Patient's daughter was very pleasant and thanked CSW for assistance.   Emotional Assessment Appearance:  Appears stated age Attitude/Demeanor/Rapport:  Unable to Assess Affect (typically observed):  Unable to Assess Orientation:  Oriented to Self, Oriented to Place, Fluctuating Orientation (Suspected and/or reported Sundowners) Alcohol / Substance use:  Not Applicable Psych involvement (Current and /or in the community):  No (Comment)  Discharge Needs  Concerns to be addressed:  Discharge Planning Concerns Readmission within the last 30 days:  No Current discharge risk:  Dependent with Mobility Barriers to Discharge:  Continued Medical Work up   ,  M, LCSW 02/02/2017, 3:29 PM  

## 2017-02-02 NOTE — Progress Notes (Addendum)
SURGICAL PROGRESS NOTE (cpt [phone])  Hospital Day(s): 4.   Post op day(s):  Marland Kitchen   Interval History: Patient seen and examined, no acute events or new complaints overnight. Patient reports tolerating clear liquids with +flatus, uncertain whether BM, and no further nausea or emesis and near-resolution of LLQ abdominal pain, denies N/V, fever/chills, CP, or SOB.  Review of Systems:  Constitutional: denies fever, chills  HEENT: denies cough or congestion  Respiratory: denies any shortness of breath  Cardiovascular: denies chest pain or palpitations  Gastrointestinal: abdominal pain, N/V, and bowel function as per interval history Genitourinary: denies burning with urination or urinary frequency Musculoskeletal: denies pain, decreased motor or sensation Integumentary: denies any other rashes or skin discolorations Neurological: denies HA or vision/hearing changes   Vital signs in last 24 hours: [min-max] current  Temp:  [97.6 F (36.4 C)-98.3 F (36.8 C)] 97.6 F (36.4 C) (05/02 2338) Pulse Rate:  [105-123] 105 (05/03 0407) Resp:  [16-18] 18 (05/02 2338) BP: (122-146)/(91-98) 131/92 (05/03 0407) SpO2:  [74 %-96 %] 94 % (05/03 0407)     Height: 5' (152.4 cm) Weight: 134 lb 6.4 oz (61 kg) BMI (Calculated): 27.4   Intake/Output this shift:  No intake/output data recorded.   Intake/Output last 2 shifts:  @IOLAST2SHIFTS @   Physical Exam:  Constitutional: alert, cooperative and no distress  HENT: normocephalic without obvious abnormality  Eyes: PERRL, EOM's grossly intact and symmetric  Neuro: CN II - XII grossly intact and symmetric without deficit  Respiratory: breathing non-labored at rest  Cardiovascular: regular rate and sinus rhythm  Gastrointestinal: soft, negligible LLQ tenderness to initial deep palpation and subsequently non-tender, abdominal girth unchanged (baseline?)  Musculoskeletal: UE and LE FROM, no edema or wounds, motor and sensation grossly intact, NT    Labs:   CBC Latest Ref Rng & Units 02/01/2017 01/30/2017 01/29/2017  WBC 3.6 - 11.0 K/uL 11.1(H) 12.7(H) 18.7(H)  Hemoglobin 12.0 - 16.0 g/dL 11.5(L) 11.0(L) 11.1(L)  Hematocrit 35.0 - 47.0 % 34.5(L) 33.2(L) 33.5(L)  Platelets 150 - 440 K/uL 267 225 237   CMP Latest Ref Rng & Units 02/01/2017 01/30/2017 01/29/2017  Glucose 65 - 99 mg/dL 124(H) 93 123(H)  BUN 6 - 20 mg/dL 14 20 24(H)  Creatinine 0.44 - 1.00 mg/dL 0.68 0.78 0.84  Sodium 135 - 145 mmol/L 146(H) 141 138  Potassium 3.5 - 5.1 mmol/L 3.3(L) 4.1 3.4(L)  Chloride 101 - 111 mmol/L 112(H) 112(H) 106  CO2 22 - 32 mmol/L 24 24 24   Calcium 8.9 - 10.3 mg/dL 8.0(L) 7.8(L) 7.7(L)  Total Protein 6.5 - 8.1 g/dL - - -  Total Bilirubin 0.3 - 1.2 mg/dL - - -  Alkaline Phos 38 - 126 U/L - - -  AST 15 - 41 U/L - - -  ALT 14 - 54 U/L - - -   Imaging studies: No new pertinent imaging studies   Assessment/Plan:(ICD-10's: K59.01, K56.7) 83 y.o.femalewith nearly resolved abdominal pain associated with severe constipation and adynamic ileus, complicated by transverse colitis with improvedleukocytosis andpertinent comorbidities including DM with peripheral neuropathy, HTN, HLD, COPD, PAD, hypothyroidism, GERD, osteoporosis, renal artery stenosis, and major depression disorder.  - full liquids + nutritional supplements, slowly advance as tolerated - long-term bowel regimen to help prevent future constipation as per medical team - complete course ofPO antibiotics for transverse colitis, attributed to severe constipation - discharge planning and medical management as per primary medical team - currently no indication for surgical intervention at this time - ambulation with assist vs frequent  repositioning  All of the above findings and recommendations were discussed with the patient and patient's RN, and all of patient's and her RN's questions were answered to their expressed  satisfaction.  Thank you for the opportunity to participate in this patient's care.  -- [name], MD, [place]: [facility] - Partnering for exceptional care. Office: [phone]

## 2017-02-02 NOTE — Progress Notes (Signed)
Patient given enema per order tolerated procedure well with no complaints of discomfort during administration.

## 2017-02-02 NOTE — NC FL2 (Signed)
[place] LEVEL OF CARE SCREENING TOOL     IDENTIFICATION  Patient Name: Tracy Richards Birthdate: 08/01/1929 Sex: female Admission Date (Current Location): 01/25/2017  [place] and [place] Number:  Engineering geologist and Address:  [facility], [address], [place], [place] [address]      Provider Number: [id number]  Attending Physician Name and Address:  Vaughan Basta, MD  Relative Name and Phone Number:       Current Level of Care: Hospital Recommended Level of Care: [facility] Prior Approval Number:    Date Approved/Denied:   PASRR Number:  ([id number] )  Discharge Plan: SNF    Current Diagnoses: Patient Active Problem List   Diagnosis Date Noted  . Colitis 01/29/2017  . Pulmonary hypertension ([place]) 10/12/2016  . Acute respiratory failure with hypoxia ([place])   . Community acquired pneumonia of left upper lobe of lung ([place])   . CAP (community acquired pneumonia) 10/08/2016  . Shortness of breath 10/08/2016  . Sacral fracture, closed ([place]) 07/24/2016  . Chronic respiratory failure with hypoxia ([place]) 03/13/2015  . COPD bronchitis 10/20/2014  . Other specified hypothyroidism 04/01/2013  . HBP (high blood pressure) 03/19/2013    Orientation RESPIRATION BLADDER Height & Weight     Self, Place  O2 (2 Liters Oxygen ) Continent Weight: 134 lb 6.4 oz (61 kg) Height:  5' (152.4 cm)  BEHAVIORAL SYMPTOMS/MOOD NEUROLOGICAL BOWEL NUTRITION STATUS   (none)  (none) Continent Diet (Diet: Clear Liquid to be advanced. )  AMBULATORY STATUS COMMUNICATION OF NEEDS Skin   Extensive Assist Verbally Normal                       Personal Care Assistance Level of Assistance  Bathing, Feeding, Dressing Bathing Assistance: Limited assistance Feeding assistance: Independent Dressing Assistance: Limited assistance     Functional Limitations Info  Sight, Hearing, Speech Sight Info: Adequate Hearing Info:  Impaired Speech Info: Adequate    SPECIAL CARE FACTORS FREQUENCY  PT (By licensed PT), OT (By licensed OT)     PT Frequency:  (5) OT Frequency:  (5)            Contractures      Additional Factors Info  Code Status, Allergies Code Status Info:  (DNR ) Allergies Info:  (Amlodipine, Avelox Moxifloxacin Hcl In Nacl, Carafate Sucralfate, Celecoxib, Diazepam, Doxazosin, Klonopin Clonazepam, Levofloxacin, Norvasc Amlodipine Besylate, Pantoprazole Sodium, Ranitidine, Rofecoxib, Shellfish Allergy, Sulfa Antibiotics, Sulfa Drug)           Current Medications (02/02/2017):  This is the current hospital active medication list Current Facility-Administered Medications  Medication Dose Route Frequency Provider Last Rate Last Dose  . 0.9 %  sodium chloride infusion   Intravenous Continuous Vaughan Basta, MD 50 mL/hr at 02/02/17 1141    . acetaminophen (TYLENOL) tablet 650 mg  650 mg Oral Q6H PRN Saundra Shelling, MD   650 mg at 01/29/17 1754   Or  . acetaminophen (TYLENOL) suppository 650 mg  650 mg Rectal Q6H PRN Pavan Pyreddy, MD      . albuterol (PROVENTIL) (2.5 MG/3ML) 0.083% nebulizer solution 3 mL  3 mL Inhalation Q6H PRN Saundra Shelling, MD   3 mL at 01/30/17 0911  . atorvastatin (LIPITOR) tablet 40 mg  40 mg Oral QHS Henreitta Leber, MD   40 mg at 02/01/17 2148  . bisacodyl (DULCOLAX) suppository 10 mg  10 mg Rectal Daily Vaughan Basta, MD   10 mg at  02/02/17 1041  . diltiazem (CARDIZEM CD) 24 hr capsule 240 mg  240 mg Oral Daily Vaughan Basta, MD   240 mg at 02/02/17 1436  . docusate sodium (COLACE) capsule 100 mg  100 mg Oral BID Vaughan Basta, MD   100 mg at 02/02/17 1436  . enoxaparin (LOVENOX) injection 40 mg  40 mg Subcutaneous Q24H Henreitta Leber, MD   40 mg at 02/02/17 1232  . gabapentin (NEURONTIN) tablet 600 mg  600 mg Oral BID Henreitta Leber, MD   600 mg at 02/02/17 1041  . ipratropium-albuterol (DUONEB) 0.5-2.5 (3) MG/3ML nebulizer solution 3  mL  3 mL Nebulization TID Vaughan Basta, MD   3 mL at 02/02/17 1302  . levothyroxine (SYNTHROID, LEVOTHROID) tablet 100 mcg  100 mcg Oral QAC breakfast Vaughan Basta, MD      . magnesium hydroxide (MILK OF MAGNESIA) suspension 30 mL  30 mL Oral Daily Vickie Epley, MD   30 mL at 02/02/17 1041  . metoprolol (LOPRESSOR) injection 5 mg  5 mg Intravenous Q6H Vaughan Basta, MD   5 mg at 02/02/17 1232  . morphine 2 MG/ML injection 2 mg  2 mg Intravenous Q4H PRN Saundra Shelling, MD   2 mg at 01/31/17 2334  . ondansetron (ZOFRAN) tablet 4 mg  4 mg Oral Q6H PRN Saundra Shelling, MD       Or  . ondansetron (ZOFRAN) injection 4 mg  4 mg Intravenous Q6H PRN Saundra Shelling, MD   4 mg at 02/02/17 0102  . phenol (CHLORASEPTIC) mouth spray 1 spray  1 spray Mouth/Throat PRN Vaughan Basta, MD   1 spray at 01/30/17 1914  . piperacillin-tazobactam (ZOSYN) IVPB 3.375 g  3.375 g Intravenous Q8H Diego F Pabon, MD 12.5 mL/hr at 02/02/17 1041 3.375 g at 02/02/17 1041  . potassium chloride SA (K-DUR,KLOR-CON) CR tablet 20 mEq  20 mEq Oral BID Vaughan Basta, MD      . senna-docusate (Senokot-S) tablet 1 tablet  1 tablet Oral Once Vickie Epley, MD      . sodium chloride flush (NS) 0.9 % injection 3 mL  3 mL Intravenous Q12H Saundra Shelling, MD   3 mL at 02/02/17 1041     Discharge Medications: Please see discharge summary for a list of discharge medications.  Relevant Imaging Results:  Relevant Lab Results:   Additional Information  (SSN: [id number])  Sample, Veronia Beets, LCSW

## 2017-02-02 NOTE — Progress Notes (Signed)
PT Evaluation Note  Assessment: Pt admitted with above diagnosis. Pt currently with functional limitations due to the deficits listed below (see PT Problem List). Ms. Considine was agreeable to therapy and tolerated all interventions well.  She currently requires min assist for transfers and unfortunately, due to low SpO2 readings (see comments below), was unable to assess ambulation at this time.  Given pt's current mobility status and the fact that she does not have 24/7 supervision/assist available at home, recommending SNF at d/c.   Pt will benefit from skilled PT to increase their independence and safety with mobility to allow discharge to the venue listed below.      02/02/17 0901  PT Visit Information  Last PT Received On 02/02/17  Assistance Needed +1  History of Present Illness Pt is a 83 y/o F who presented with abdominal pain and was found to have an ileus and susptected constipation and clitis due to retained stool. Chest xray shows pulmonary edema.  Pt's PMH includes vertigo, CHF, COPD, gout, osteoporosis, neuropathy, spinal stenosis, back surgery.  Precautions  Precautions Fall;Other (comment)  Precaution Comments monitor O2 (2L at baseline)  Restrictions  Weight Bearing Restrictions No  Home Living  Family/patient expects to be discharged to: Private residence  [place];Other relatives (grandson)  Available Help at Discharge Family;Personal care attendant;Available PRN/intermittently  Type of Home House  Home Access Stairs to enter  Entrance Stairs-Number of Steps 4 (1+3)  Entrance Stairs-Rails None  Home Layout One level  Bathroom Toilet Handicapped height  Home Equipment [place] - 2 wheels;Cane - single point;Shower seat  Prior Function  Level of Independence Needs assistance  Gait / Transfers Assistance Needed Pt ambulates with RW in home and SPC in community.  She reports one fall in the past 6 months which was recent and pt with R knee cut.    ADL's  / Homemaking Assistance Needed Daughter or care assistant prepares bath water for sponge bathing and pt reports she can sponge bathe ind.  She does require occasional assist for dressing.  Daughter or care assistant does the driving, cooking, cleaning.   Communication  Communication HOH  Pain Assessment  Pain Assessment 0-10  Pain Score 7  Pain Location abdomen  Pain Descriptors / Indicators Discomfort  Pain Intervention(s) Limited activity within patient's tolerance;Monitored during session;Repositioned  Cognition  Arousal/Alertness Awake/alert  Behavior During Therapy WFL for tasks assessed/performed  Overall Cognitive Status Within Functional Limits for tasks assessed  Upper Extremity Assessment  Upper Extremity Assessment Generalized weakness (Strength grossly 3/5 BUEs)  Lower Extremity Assessment  Lower Extremity Assessment Generalized weakness (Strength grossly 3/5 BLEs)  Cervical / Trunk Assessment  Cervical / Trunk Assessment Kyphotic  Bed Mobility  Overal bed mobility Needs Assistance  Bed Mobility Supine to Sit  Supine to sit Min guard;HOB elevated  General bed mobility comments Pt uses bed rail and requires increased time and effort.  Transfers  Overall transfer level Needs assistance  Equipment used Rolling walker (2 wheeled)  Transfers Sit to/from [facility]  Sit to Stand Min assist  Stand pivot transfers Min assist  General transfer comment Min assist to steady with sit<>stand and stand pivot transfers due to instability.  Cues for proper hand placement during sit<>stand.  Ambulation/Gait  General Gait Details Not appropriate to assess a this time as SpO2 ranging 85%-89% on 2L O2 after performing stand pivot transfer.  It took ~3 minutes for SpO2 to reach 90% on 2L O2.    Balance  Overall  balance assessment Needs assistance  Sitting-balance support No upper extremity supported;Feet supported  Sitting balance-Leahy Scale Good  Standing balance  support Bilateral upper extremity supported;During functional activity  Standing balance-Leahy Scale Poor  Standing balance comment Relies on RW and min assist for static and dynamic activities  Exercises  Exercises General Lower Extremity;Other exercises  General Exercises - Lower Extremity  Ankle Circles/Pumps AROM;Both;10 reps;Seated  Long [employer] Both;10 reps;Seated;AROM  Hip Flexion/Marching Both;10 reps;Seated  Other Exercises  Other Exercises Seated scapular retractions in sitting x10  PT - End of Session  Equipment Utilized During Treatment Gait belt;Oxygen  Activity Tolerance Treatment limited secondary to medical complications (Comment) (hypoxia)  Patient left in chair;with call bell/phone within reach;with chair alarm set  Nurse Communication Mobility status;Other (comment) (SpO2)  PT Assessment  PT Recommendation/Assessment Patient needs continued PT services  PT Visit Diagnosis Muscle weakness (generalized) (M62.81);Unsteadiness on feet (R26.81)  PT Problem List Decreased strength;Decreased activity tolerance;Decreased balance;Decreased knowledge of use of DME;Decreased safety awareness;Cardiopulmonary status limiting activity  Barriers to Discharge Decreased caregiver support  Barriers to Discharge Comments Pt does not have 24/7 care  PT Plan  PT Frequency (ACUTE ONLY) Min 2X/week  PT Treatment/Interventions (ACUTE ONLY) DME instruction;Gait training;Stair training;Functional mobility training;Therapeutic activities;Therapeutic exercise;Balance training;Neuromuscular re-education;Patient/family education;Wheelchair mobility training  AM-PAC PT "6 Clicks" Daily Activity Outcome Measure  Difficulty turning over in bed (including adjusting bedclothes, sheets and blankets)? 1  Difficulty moving from lying on back to sitting on the side of the bed?  1  Difficulty sitting down on and standing up from a chair with arms (e.g., wheelchair, bedside commode, etc,.)? 1  Help needed  moving to and from a bed to chair (including a wheelchair)? 3  Help needed walking in hospital room? 3  Help needed climbing 3-5 steps with a railing?  2  6 Click Score 11  Mobility G Code  CL  PT Recommendation  Recommendations for Other Services OT consult  Follow Up Recommendations SNF  PT equipment None recommended by PT  Individuals Consulted  Consulted and Agree with Results and Recommendations Patient  Acute Rehab PT Goals  Patient Stated Goal to go home  PT Goal Formulation With patient  Time For Goal Achievement 02/16/17  Potential to Achieve Goals Good  PT Time Calculation  PT Start Time (ACUTE ONLY) 0837  PT Stop Time (ACUTE ONLY) 0900  PT Time Calculation (min) (ACUTE ONLY) 23 min  PT General Charges  $$ ACUTE PT VISIT 1 Procedure  PT Evaluation  $PT Eval Low Complexity 1 Procedure  PT Treatments  $Therapeutic Activity 8-22 mins   Collie Siad PT, DPT

## 2017-02-03 ENCOUNTER — Inpatient Hospital Stay: Payer: Medicare Other

## 2017-02-03 LAB — BASIC METABOLIC PANEL
ANION GAP: 5 (ref 5–15)
BUN: 17 mg/dL (ref 6–20)
CHLORIDE: 109 mmol/L (ref 101–111)
CO2: 27 mmol/L (ref 22–32)
Calcium: 7.5 mg/dL — ABNORMAL LOW (ref 8.9–10.3)
Creatinine, Ser: 0.76 mg/dL (ref 0.44–1.00)
Glucose, Bld: 132 mg/dL — ABNORMAL HIGH (ref 65–99)
POTASSIUM: 3.5 mmol/L (ref 3.5–5.1)
SODIUM: 141 mmol/L (ref 135–145)

## 2017-02-03 MED ORDER — LACTULOSE 10 GM/15ML PO SOLN
30.0000 g | Freq: Two times a day (BID) | ORAL | Status: DC
  Administered 2017-02-03: 30 g via ORAL
  Filled 2017-02-03 (×2): qty 60

## 2017-02-03 MED ORDER — POLYETHYLENE GLYCOL 3350 17 GM/SCOOP PO POWD
1.0000 | Freq: Once | ORAL | Status: DC
  Filled 2017-02-03 (×2): qty 255

## 2017-02-03 MED ORDER — METHYLNALTREXONE BROMIDE 12 MG/0.6ML ~~LOC~~ SOLN
12.0000 mg | Freq: Once | SUBCUTANEOUS | Status: AC
  Administered 2017-02-03: 12 mg via SUBCUTANEOUS
  Filled 2017-02-03 (×2): qty 0.6

## 2017-02-03 MED ORDER — FLEET ENEMA 7-19 GM/118ML RE ENEM
1.0000 | ENEMA | Freq: Once | RECTAL | Status: AC
Start: 2017-02-03 — End: 2017-02-03
  Administered 2017-02-03: 1 via RECTAL

## 2017-02-03 MED ORDER — BISACODYL 10 MG RE SUPP: 10.0000 mg | Freq: Every day | RECTAL | Status: DC

## 2017-02-03 NOTE — Progress Notes (Signed)
Patient had 1 episode of n/v

## 2017-02-03 NOTE — Progress Notes (Signed)
SURGICAL PROGRESS NOTE (cpt [phone])  Hospital Day(s): 5.   Post op day(s):  Marland Kitchen   Interval History: Patient seen and examined, no acute events or new complaints overnight. Patient reports tolerating liquids and ongoing flatus with no BM since yesterday, though mild abdominal pain/discomfort and denies N/V, fever/chills, CP, or SOB. Patient's RN reports the patient's mild-/moderate- abdominal distention appears decreased at time of evaluation in comparison to earlier this morning.  Review of Systems:  Constitutional: denies fever, chills  HEENT: denies cough or congestion  Respiratory: denies any shortness of breath  Cardiovascular: denies chest pain or palpitations  Gastrointestinal: abdominal pain, N/V, and bowel function as per interval history Genitourinary: denies burning with urination or urinary frequency Musculoskeletal: denies pain, decreased motor or sensation Integumentary: denies any other rashes or skin discolorations Neurological: denies HA or vision/hearing changes   Vital signs in last 24 hours: [min-max] current  Temp:  [97.5 F (36.4 C)-98.5 F (36.9 C)] 98.2 F (36.8 C) (05/03 2333) Pulse Rate:  [87-106] 97 (05/03 2333) Resp:  [18-19] 19 (05/03 2333) BP: (75-144)/(57-90) 142/68 (05/03 2357) SpO2:  [91 %-100 %] 100 % (05/03 2333)     Height: 5' (152.4 cm) Weight: 134 lb 6.4 oz (61 kg) BMI (Calculated): 27.4   Intake/Output this shift:  No intake/output data recorded.   Intake/Output last 2 shifts:  @IOLAST2SHIFTS @   Physical Exam:  Constitutional: alert, cooperative and no distress  HENT: normocephalic without obvious abnormality  Eyes: PERRL, EOM's grossly intact and symmetric  Neuro: CN II - XII grossly intact and symmetric without deficit  Respiratory: breathing non-labored at rest  Cardiovascular: regular rate and sinus rhythm  Gastrointestinal: soft, mild LLQ abdominal tenderness to deep palpation, moderate (slightly increased) abdominal distention   Musculoskeletal: UE and LE FROM, no edema or wounds, motor and sensation grossly intact, NT   Labs:  CBC Latest Ref Rng & Units 02/01/2017 01/30/2017 01/29/2017  WBC 3.6 - 11.0 K/uL 11.1(H) 12.7(H) 18.7(H)  Hemoglobin 12.0 - 16.0 g/dL 11.5(L) 11.0(L) 11.1(L)  Hematocrit 35.0 - 47.0 % 34.5(L) 33.2(L) 33.5(L)  Platelets 150 - 440 K/uL 267 225 237   CMP Latest Ref Rng & Units 02/03/2017 02/01/2017 01/30/2017  Glucose 65 - 99 mg/dL 132(H) 124(H) 93  BUN 6 - 20 mg/dL 17 14 20   Creatinine 0.44 - 1.00 mg/dL 0.76 0.68 0.78  Sodium 135 - 145 mmol/L 141 146(H) 141  Potassium 3.5 - 5.1 mmol/L 3.5 3.3(L) 4.1  Chloride 101 - 111 mmol/L 109 112(H) 112(H)  CO2 22 - 32 mmol/L 27 24 24   Calcium 8.9 - 10.3 mg/dL 7.5(L) 8.0(L) 7.8(L)  Total Protein 6.5 - 8.1 g/dL - - -  Total Bilirubin 0.3 - 1.2 mg/dL - - -  Alkaline Phos 38 - 126 U/L - - -  AST 15 - 41 U/L - - -  ALT 14 - 54 U/L - - -    Assessment/Plan:(ICD-10's: K59.01, K56.7) 83 y.o.femalewith nearly resolved abdominal pain associated with severe constipation and adynamic ileus, complicated by transverse colitis with improvedleukocytosis andpertinent comorbidities including DM with peripheral neuropathy, HTN, HLD, COPD, PAD, hypothyroidism, GERD, osteoporosis, renal artery stenosis, and major depression disorder.   - add lactulose to bowel regimen - full liquids + nutritional supplements for now - long-term bowel regimen to help prevent future constipation as per medical team - complete course ofPO antibiotics for transverse colitis, attributed to severe constipation - discharge planning and medical management as per primary medical team - currently no indication for surgical intervention  at this time - ambulation with assist vs frequent repositioning  All of the above findings and recommendations were discussed with the patient andpatient's RN, and all of patient's and  her RN's questions were answered to their expressed satisfaction.  Thank you for the opportunity to participate in this patient's care.  -- [name], MD, [place]: [facility] - Partnering for exceptional care. Office: [phone]

## 2017-02-03 NOTE — Care Management Important Message (Signed)
Important Message  Patient Details  Name: Tracy Richards MRN: [id number] Date of Birth: 1928/11/30   Medicare Important Message Given:  Yes    Marshell Garfinkel, RN 02/03/2017, 10:44 AM

## 2017-02-03 NOTE — Consult Note (Signed)
   [facility] CM Inpatient Consult   02/03/2017  LYLIE BLACKLOCK 05-12-1929 [id number]   Patient screened for potential [facility] services. Patient is eligible for [facility]. Electronic medical record reveals patient's discharge plan is SNF. [facility] Care Management services not appropriate at this time. If patient's post hospital needs change please place a [facility] Care Management consult. For questions please contact:   Brenda Samano RN, [facility] Liaison  [phone]) Business Mobile [phone]) Toll free office

## 2017-02-03 NOTE — Progress Notes (Signed)
In and out cath done. 350cc tea colored urine returned.

## 2017-02-03 NOTE — Clinical Social Work Note (Signed)
CSW met with pt to address discharge plan. Pt wanted CSW to call her daughter. CSW was then notified by [facility] that chose to return home with home health services. CSW spoke with daughter to present bed offers and also shared with her pt's decision to return home. Pt's daughter is not in agreement with pt returning home and feels STR would beneficial. Pt's daughter is interested in [facility]. CSW secured the bed for next week. Pt's daughter will address dispo with pt over the weekend. Per MD, pt will not be ready until next week. CSW will continue to follow.   Darden Dates, MSW, LCSW Clinical Social Worker  [phone]

## 2017-02-03 NOTE — Progress Notes (Signed)
Tracy Richards at [place] NAME: Tracy Richards    MR#:  [id number]  DATE OF BIRTH:  Oct 10, 1928  SUBJECTIVE:   Patient here due to abdominal pain nausea vomiting and noted to have an ileus and also suspected Constipated and colitis due to retained stool.     Chest x-ray shows pulmonary edema. Patient does complain of cough with secretions.   Had few BM, and xray abd shows improvement in ileus and constipation.  Seen in morning, and was not having any complains- surgery suggested to d/c NGT and start liquids orally.  Pt have more distention of abdomen. No more vomiting.  REVIEW OF SYSTEMS:    Review of Systems  Constitutional: Negative for chills and fever.  HENT: Negative for congestion and tinnitus.   Eyes: Negative for blurred vision and double vision.  Respiratory: Positive for cough. Negative for shortness of breath and wheezing.   Cardiovascular: Negative for chest pain, orthopnea and PND.  Gastrointestinal: Negative for diarrhea, nausea and vomiting.  Genitourinary: Negative for dysuria and hematuria.  Neurological: Negative for dizziness, sensory change and focal weakness.  All other systems reviewed and are negative.   Nutrition: NPO Tolerating Diet: No Tolerating PT: Await Eval.    DRUG ALLERGIES:   Allergies  Allergen Reactions  . Amlodipine Swelling  . Avelox [Moxifloxacin Hcl In Nacl] Other (See Comments)    G.I. Upset   . Carafate [Sucralfate] Itching    Ulcers also   . Celecoxib Other (See Comments)    Reaction unknown  . Diazepam Hives  . Doxazosin Other (See Comments)    Reaction unknown  . Klonopin [Clonazepam] Hives  . Levofloxacin Cough    Choking Sensation  . Norvasc [Amlodipine Besylate] Swelling  . Pantoprazole Sodium Itching  . Ranitidine Itching  . Rofecoxib Other (See Comments)    Reaction unknown  . Shellfish Allergy Diarrhea  . Sulfa Antibiotics Other (See Comments)    Mouth Sores  . Sulfa Drugs  Cross Reactors Other (See Comments)    Mouth Sores  . Codeine Anxiety    "Makes me crazy"  . Tape Rash and Other (See Comments)    SKIN IS VERY THIN; TEARS AND BRUISES VERY EASILY!!    VITALS:  Blood pressure 108/81, pulse 81, temperature 98 F (36.7 C), temperature source Oral, resp. rate 16, height 5' (1.524 m), weight 61 kg (134 lb 6.4 oz), SpO2 (!) 86 %.  PHYSICAL EXAMINATION:   Physical Exam  GENERAL:  83 y.o.-year-old patient lying in bed in some acute distress.  EYES: Pupils equal, round, reactive to light and accommodation. No scleral icterus. Extraocular muscles intact.  HEENT: Head atraumatic, normocephalic. Oropharynx and nasopharynx clear.  NECK:  Supple, no jugular venous distention. No thyroid enlargement, no tenderness.  LUNGS: Normal breath sounds bilaterally, no wheezing, some crepitations. No use of accessory muscles of respiration.    Have some cough with clear secretions. CARDIOVASCULAR: S1, S2 normal. No murmurs, rubs, or gallops.  ABDOMEN: Soft, Tender in the lower abdomen,  distended. slugish Bowel sounds. No organomegaly or mass.  EXTREMITIES: No cyanosis, clubbing or edema b/l.    NEUROLOGIC: Cranial nerves II through XII are intact. No focal Motor or sensory deficits b/l.   PSYCHIATRIC: The patient is alert and oriented x 3.  SKIN: No obvious rash, lesion, or ulcer.    LABORATORY PANEL:   CBC  Recent Labs Lab 02/01/17 0422  WBC 11.1*  HGB 11.5*  HCT 34.5*  PLT 267   ------------------------------------------------------------------------------------------------------------------  Chemistries   Recent Labs Lab 01/21/2017 2029  02/03/17 0520  NA 134*  < > 141  K 3.2*  < > 3.5  CL 102  < > 109  CO2 22  < > 27  GLUCOSE 155*  < > 132*  BUN 24*  < > 17  CREATININE 0.93  < > 0.76  CALCIUM 7.5*  < > 7.5*  AST 21  --   --   ALT 10*  --   --   ALKPHOS 84  --   --   BILITOT 0.9  --   --   < > = values in this interval not  displayed. ------------------------------------------------------------------------------------------------------------------  Cardiac Enzymes No results for input(s): TROPONINI in the last 168 hours. ------------------------------------------------------------------------------------------------------------------  RADIOLOGY:  Dg Abd 1 View  Result Date: 02/03/2017 CLINICAL DATA:  83 year old with lower abdominal pain. Follow-up ileus. EXAM: ABDOMEN - 1 VIEW COMPARISON:  02/03/2017, 02/02/2017 and earlier, including CT abdomen and pelvis 01/19/2017 and earlier. FINDINGS: Worsening gaseous distention of the colon to the level of the proximal sigmoid and increasing gaseous distention of the distal small bowel since yesterday's examination. Persistent moderate colonic stool burden with stool balls noted in the colon. No suggestion of free air on these supine image. IMPRESSION: Worsening generalized ileus. Electronically Signed   By: Tracy Richards M.D.   On: 02/03/2017 10:24   Dg Abd 1 View  Result Date: 02/02/2017 CLINICAL DATA:  Ileus EXAM: ABDOMEN - 1 VIEW COMPARISON:  02/01/2017 FINDINGS: Moderate gaseous distention of large and small bowel again noted, unchanged. Moderate stool throughout the colon. Findings most compatible with ileus, unchanged. No free air organomegaly. IMPRESSION: Stable ileus pattern. Electronically Signed   By: Tracy Richards M.D.   On: 02/02/2017 08:38   Dg Abd 1 View  Result Date: 02/01/2017 CLINICAL DATA:  Abdominal pain EXAM: ABDOMEN - 1 VIEW COMPARISON:  COMPARISON 01/31/2017 FINDINGS: Scattered large and small bowel gas is noted. The degree of air within the colon is increased in the interval from the prior exam. No obstructive changes are seen. No free air is noted. The nasogastric catheter seen previously is no longer identified. Diffuse aortic calcifications are seen. No acute bony abnormality is noted. IMPRESSION: No obstructive changes are noted. The degree of air  suggests a generalized ileus. Electronically Signed   By: Tracy Richards M.D.   On: 02/01/2017 18:16     ASSESSMENT AND PLAN:   83 year old female past medical history of spinal stenosis, neuropathy, osteoporosis, hypothyroidism, hypertension, hyperlipidemia, gout, GERD, diabetes, COPD, history of CHF who presented to the hospital due to abdominal pain, nausea vomiting and noted to have CT scan findings suggestive of ileus and stercoral colitis.  1. Ileus/stercoral colitis-secondary to retained stool. Patient presently not having any nausea or vomiting. No drainage noted from the NG tube. -CT scan suggestive of retained stool. Patient took an enema at home prior to coming to the hospital with some minimal response. Dulcolax suppository has helped with 3 BM yesterday. -Continue supportive care with IV fluids, anti-emetics. Appreciated surgical input. - Ileus improving as per x ray KUB.  Surgery told- to d/c NGT and start feeding liquids.   Pt had some vomiting with that, advised to have small amount of liquids at a time.   Again have more distention and " balls of stool" in her colon as per xray.   Will give miralax prep and enema.  2. Leukocytosis - due to # 1.  -  Surgery suggested Zosyn IV  due to colitis.  3. Hyperlipidemia - cont. Atorvastatin  4. Hypokalemia - due to N/V.  - improved w/ supplementation and will monitor.   5. Neuropathy - cont. Gabapentin.   6. Ac diastolic CHF   Held IV fluids for 1 day.   IV lasix.   Have duoneb to help with coughing and congestions.   Improved, give slow rate IV fluids as pt is NPO.  * A fib   Resume cardizem CD oral.   In past pt had refused for anticoagulant to Dr. Saralyn Pilar due to frequent falls.  * hypothyroidism   Resume levothyroxine.  All the records are reviewed and case discussed with Care Management/Social Worker. Management plans discussed with the patient, family and they are in agreement.  CODE STATUS: DNR  DVT  Prophylaxis: Lovenox  TOTAL TIME TAKING CARE OF THIS PATIENT: 30 minutes.   POSSIBLE D/C IN 1-2 DAYS, DEPENDING ON CLINICAL CONDITION.   Vaughan Basta M.D on 02/03/2017 at 2:06 PM  Between 7am to 6pm - Pager - [phone]  After 6pm go to [email or web address] - Proofreader  [facility]  Office  [phone]  CC: Primary care physician; Cyndi Bender, PA-C

## 2017-02-03 NOTE — Care Management Note (Signed)
Case Management Note  Patient Details  Name: LAMICA MCCART MRN: [id number] Date of Birth: [birth year]  Subjective/Objective:                   Met with patient to discuss discharge planning. She states she is from home with private sitters. She states she is [facility]. She states she is able to walk with [place]. She is not interested in SNF for rehab. She would [facility] use [facility] for [facility] health. CSW updated.   Action/Plan:  [facility] for [facility]. Health.   Expected Discharge Date:                  Expected Discharge Plan:     In-House Referral:  Clinical Social Work  Discharge planning Services  CM Consult  Post Acute Care Choice:  Home Health Choice offered to:  Patient  DME Arranged:    DME Agency:     HH Arranged:  PT, RN, Nurse's Aide, Social Work [employer] Agency:  [facility]  Status of Service:  In process, will continue to follow  If discussed at Long Length of Stay Meetings, dates discussed:    Additional Comments:  Marshell Garfinkel, RN 02/03/2017, 2:49 PM

## 2017-02-03 NOTE — Progress Notes (Signed)
Bladder scan 388. Pt complaining of feeling full. Dr Jannifer Franklin notified. Received order to in and out cath.

## 2017-02-03 NOTE — Progress Notes (Signed)
Patient alert and oriented to person, place, and situation. RN bladder scanned patient and result was >426ml. MD notified and ordered RN to place foley for acute urinary retention. Patient is passing gas but no stool at this time. Abdomen hard and distended. MD will consult with GI.   Deri Fuelling, RN

## 2017-02-03 NOTE — Progress Notes (Signed)
Patient has expiratory wheezing on auscultation. Patient on 2LO2 chronic. MD notified and discontinued IV fluids.   Deri Fuelling, RN

## 2017-02-04 ENCOUNTER — Inpatient Hospital Stay: Payer: Medicare Other

## 2017-02-04 MED ORDER — FUROSEMIDE 10 MG/ML IJ SOLN: 20.0000 mg | Freq: Once | INTRAMUSCULAR | Status: DC

## 2017-03-03 NOTE — Progress Notes (Signed)
Rapid Response called, pt with wet gurgly cough. MD Boyce Medici notified. Nursing supervisor and RRT at bedside.  Pt transferred to ICU.

## 2017-03-03 NOTE — Progress Notes (Signed)
NG tube placed in left nares, (charge nurse assisting). Pt tolerated without difficulties. Awaiting xray results for verification of tube placement.

## 2017-03-03 NOTE — Progress Notes (Signed)
Unable to insert ng tube on 2 attempts 14 and 16 french. Patient complaining of right lower quad pain. Abdomen is distended with high pitch tin sound in RLQ.

## 2017-03-03 NOTE — Progress Notes (Addendum)
SURGICAL PROGRESS NOTE (cpt [phone])  Hospital Day(s): 6.   Post op day(s):  Mar[facility] Kitchen   Interval History: Patient seen and examined, no acute events or new complaints overnight. Patient reports increased abdominal distention and mild diffuse abdominal "discomfort" without any passage of BM x24 hours despite +flatus, denies N/V, fever/chills, CP, or SOB.  Review of Systems:  Constitutional: denies fever, chills  HEENT: denies cough or congestion  Respiratory: denies any shortness of breath  Cardiovascular: denies chest pain or palpitations  Gastrointestinal: abdominal pain, N/V, and bowel function as per interval history Genitourinary: denies burning with urination or urinary frequency Musculoskeletal: denies pain, decreased motor or sensation Integumentary: denies any other rashes or skin discolorations Neurological: denies HA or vision/hearing changes   Vital signs in last 24 hours: [min-max] current  Temp:  [98 F (36.7 C)-98.6 F (37 C)] 98.6 F (37 C) (05/05 0000) Pulse Rate:  [80-94] 80 (05/05 0000) Resp:  [16-18] 18 (05/05 0000) BP: (105-121)/(66-83) 110/66 (05/05 0000) SpO2:  [86 %-100 %] 96 % (05/05 0000)     Height: 5' (152.4 cm) Weight: 134 lb 6.4 oz (61 kg) BMI (Calculated): 27.4   Intake/Output this shift:  No intake/output data recorded.   Intake/Output last 2 shifts:  @IOLAST2SHIFTS @   Physical Exam:  Constitutional: alert, cooperative and no distress  HENT: normocephalic without obvious abnormality  Eyes: PERRL, EOM's grossly intact and symmetric  Neuro: CN II - XII grossly intact and symmetric without deficit  Respiratory: breathing non-labored at rest  Cardiovascular: regular rate and sinus rhythm  Gastrointestinal: soft, moderate abdominal distention with mild diffuse abdominal tenderness to palpation, no guarding or rebound tenderness  Musculoskeletal: UE and LE FROM, motor and sensation grossly intact, NT   Labs:  CBC Latest Ref Rng & Units 02/01/2017  01/30/2017 01/29/2017  WBC 3.6 - 11.0 K/uL 11.1(H) 12.7(H) 18.7(H)  Hemoglobin 12.0 - 16.0 g/dL 11.5(L) 11.0(L) 11.1(L)  Hematocrit 35.0 - 47.0 % 34.5(L) 33.2(L) 33.5(L)  Platelets 150 - 440 K/uL 267 225 237   CMP Latest Ref Rng & Units 02/03/2017 02/01/2017 01/30/2017  Glucose 65 - 99 mg/dL 132(H) 124(H) 93  BUN 6 - 20 mg/dL 17 14 20   Creatinine 0.44 - 1.00 mg/dL 0.76 0.68 0.78  Sodium 135 - 145 mmol/L 141 146(H) 141  Potassium 3.5 - 5.1 mmol/L 3.5 3.3(L) 4.1  Chloride 101 - 111 mmol/L 109 112(H) 112(H)  CO2 22 - 32 mmol/L 27 24 24   Calcium 8.9 - 10.3 mg/dL 7.5(L) 8.0(L) 7.8(L)  Total Protein 6.5 - 8.1 g/dL - - -  Total Bilirubin 0.3 - 1.2 mg/dL - - -  Alkaline Phos 38 - 126 U/L - - -  AST 15 - 41 U/L - - -  ALT 14 - 54 U/L - - -    Assessment/Plan:(ICD-10's: K59.01, K56.7) 83 y.o.femalewith recurrent abdominal distentionsecondary to severe constipation, complicated by transverse colitis with improvedleukocytosis andpertinent comorbidities including DM with peripheral neuropathy, HTN, HLD, COPD on chronic home O2, PAD, hypothyroidism, GERD, osteoporosis, renal artery stenosis, and major depression disorder.   - agree with NG tube for GI decompression             - following decompression, add bowel prep to bowel regimen via NG tube - long-term bowel regimen to help prevent future constipationas per medical team - complete course ofPO antibiotics for transverse colitis, attributed to severe constipation - currently no indication for surgical intervention at this time - ambulation with assist vs frequent repositioning  All of the  above findings and recommendations were discussed with the patient andpatient's RN, and all of patient's and her RN's questions were answered to their expressed satisfaction.  Thank you for the opportunity to participate in this patient's care.  -- [name], MD, [place]: [facility] - Partnering for exceptional care. Office: [phone]

## 2017-03-03 NOTE — Progress Notes (Signed)
[facility] received a RR page for Pt in [place]. [facility] arrived when RRT was evaluating the Pt. Pt had trouble breathing and was moved to critical care in [place]. [place] escorted daughters to the ICU. Daughter asked [place] to read Psalms 23 for Pt before she expired. [place] read Psalms 23 for Pt and family and then few minutes later Pt expired. CH provide prayer, grief support, and spiritual support for Pt's 3 daughters, 2 son-in-laws, and grandson.    Mar 06, 2017 1900  Clinical Encounter Type  Visited With Patient;Patient and family together  Visit Type Follow-up;Spiritual support;Code;Patient actively dying;Trauma  Referral From Nurse  Consult/Referral To Chaplain  Spiritual Encounters  Spiritual Needs Prayer;Emotional;Grief support

## 2017-03-03 NOTE — Progress Notes (Signed)
[place] at [place] NAME: Tracy Richards    MR#:  [id number]  DATE OF BIRTH:  03-05-1929  SUBJECTIVE:    Patient here due to abdominal pain nausea vomiting and noted to have an ileus and also suspected Constipated and colitis due to retained stool.     Chest x-ray shows pulmonary edema. Patient does complain of cough with secretions.   Had few BM, and xray abd shows improvement in ileus and constipation.   Pt have more distention of abdomen. more vomiting.  REVIEW OF SYSTEMS:    Review of Systems  Constitutional: Negative for chills and fever.  HENT: Negative for congestion and tinnitus.   Eyes: Negative for blurred vision and double vision.  Respiratory: Positive for cough. Negative for shortness of breath and wheezing.   Cardiovascular: Negative for chest pain, orthopnea and PND.  Gastrointestinal: Negative for diarrhea, nausea and vomiting.  Genitourinary: Negative for dysuria and hematuria.  Neurological: Negative for dizziness, sensory change and focal weakness.  All other systems reviewed and are negative.   Nutrition: NPO Tolerating Diet: No Tolerating PT: Await Eval.    DRUG ALLERGIES:   Allergies  Allergen Reactions  . Amlodipine Swelling  . Avelox [Moxifloxacin Hcl In Nacl] Other (See Comments)    G.I. Upset   . Carafate [Sucralfate] Itching    Ulcers also   . Celecoxib Other (See Comments)    Reaction unknown  . Diazepam Hives  . Doxazosin Other (See Comments)    Reaction unknown  . Klonopin [Clonazepam] Hives  . Levofloxacin Cough    Choking Sensation  . Norvasc [Amlodipine Besylate] Swelling  . Pantoprazole Sodium Itching  . Ranitidine Itching  . Rofecoxib Other (See Comments)    Reaction unknown  . Shellfish Allergy Diarrhea  . Sulfa Antibiotics Other (See Comments)    Mouth Sores  . Sulfa Drugs Cross Reactors Other (See Comments)    Mouth Sores  . Codeine Anxiety    "Makes me crazy"  . Tape Rash and  Other (See Comments)    SKIN IS VERY THIN; TEARS AND BRUISES VERY EASILY!!    VITALS:  Blood pressure 108/69, pulse 84, temperature 97.4 F (36.3 C), temperature source Oral, resp. rate 18, height 5' (1.524 m), weight 61 kg (134 lb 6.4 oz), SpO2 92 %.  PHYSICAL EXAMINATION:   Physical Exam  GENERAL:  83 y.o.-year-old patient lying in bed in some acute distress.  EYES: Pupils equal, round, reactive to light and accommodation. No scleral icterus. Extraocular muscles intact.  HEENT: Head atraumatic, normocephalic. Oropharynx and nasopharynx clear.  NECK:  Supple, no jugular venous distention. No thyroid enlargement, no tenderness.  LUNGS: Normal breath sounds bilaterally, no wheezing, some crepitations. No use of accessory muscles of respiration.    Have some cough with clear secretions. CARDIOVASCULAR: S1, S2 normal. No murmurs, rubs, or gallops.  ABDOMEN: Soft, Tender in the lower abdomen, more distended. slugish Bowel sounds. No organomegaly or mass.  EXTREMITIES: No cyanosis, clubbing or edema b/l.    NEUROLOGIC: Cranial nerves II through XII are intact. No focal Motor or sensory deficits b/l.   PSYCHIATRIC: The patient is alert and oriented x 3.  SKIN: No obvious rash, lesion, or ulcer.    LABORATORY PANEL:   CBC  Recent Labs Lab 02/01/17 0422  WBC 11.1*  HGB 11.5*  HCT 34.5*  PLT 267   ------------------------------------------------------------------------------------------------------------------  Chemistries   Recent Labs Lab 01/16/2017 2029  02/03/17 0520  NA 134*  < >  141  K 3.2*  < > 3.5  CL 102  < > 109  CO2 22  < > 27  GLUCOSE 155*  < > 132*  BUN 24*  < > 17  CREATININE 0.93  < > 0.76  CALCIUM 7.5*  < > 7.5*  AST 21  --   --   ALT 10*  --   --   ALKPHOS 84  --   --   BILITOT 0.9  --   --   < > = values in this interval not  displayed. ------------------------------------------------------------------------------------------------------------------  Cardiac Enzymes No results for input(s): TROPONINI in the last 168 hours. ------------------------------------------------------------------------------------------------------------------  RADIOLOGY:  Dg Abd 1 View  Result Date: 02/03/2017 CLINICAL DATA:  83 year old with lower abdominal pain. Follow-up ileus. EXAM: ABDOMEN - 1 VIEW COMPARISON:  02/03/2017, 02/02/2017 and earlier, including CT abdomen and pelvis 01/27/2017 and earlier. FINDINGS: Worsening gaseous distention of the colon to the level of the proximal sigmoid and increasing gaseous distention of the distal small bowel since yesterday's examination. Persistent moderate colonic stool burden with stool balls noted in the colon. No suggestion of free air on these supine image. IMPRESSION: Worsening generalized ileus. Electronically Signed   By: Evangeline Dakin M.D.   On: 02/03/2017 10:24     ASSESSMENT AND PLAN:   83 year old female past medical history of spinal stenosis, neuropathy, osteoporosis, hypothyroidism, hypertension, hyperlipidemia, gout, GERD, diabetes, COPD, history of CHF who presented to the hospital due to abdominal pain, nausea vomiting and noted to have CT scan findings suggestive of ileus and stercoral colitis.  1. Ileus/stercoral colitis-secondary to retained stool. Patient presently not having any nausea or vomiting. No drainage noted from the NG tube. -CT scan suggestive of retained stool. Patient took an enema at home prior to coming to the hospital with some minimal response. Dulcolax suppository has helped with 3 BM yesterday. -Continue supportive care with IV fluids, anti-emetics. Appreciated surgical input. - Patient was kept on NG tube and suction, improved ileus initially so NG tube was removed- started on clear liquid diet. But for last 2 days having vomiting and worsening  distention again. -  We will place NG tube and today again and started suctioning, discussed with GI on phone and he suggested to give MiraLAX bowel preparation with soapsuds enema. - Once we decompress her small intestine by NGT suction then we will start her on bowel prep.  2. Leukocytosis - due to # 1.  -  Surgery suggested Zosyn IV due to colitis.  3. Hyperlipidemia - cont. Atorvastatin  4. Hypokalemia - due to N/V.  - improved w/ supplementation and will monitor.   5. Neuropathy - cont. Gabapentin.   * Ac diastolic CHF   Held IV fluids for 1 day.   IV lasix.   Have duoneb to help with coughing and congestions.  Gave her IV fluids at slower rate because of decreased oral intake but now appears having CHF again so we'll stop the fluid now.  * A fib   At home takes cardizem CD oral.   In past pt had refused for anticoagulant to Dr. Saralyn Pilar due to frequent falls.   Currently keeping nothing by mouth, rate is controlled.  * hypothyroidism   Resume levothyroxine.   Check TSH.  All the records are reviewed and case discussed with Care Management/Social Worker. Management plans discussed with the patient, family and they are in agreement.  CODE STATUS: DNR  DVT Prophylaxis: Lovenox  TOTAL TIME TAKING CARE OF  THIS PATIENT: 30 minutes.   POSSIBLE D/C IN 1-2 DAYS, DEPENDING ON CLINICAL CONDITION.   Vaughan Basta M.D on 02-15-2017 at 1:07 PM  Between 7am to 6pm - Pager - [phone]  After 6pm go to [email or web address] - Proofreader  [facility]  Office  [phone]  CC: Primary care physician; Cyndi Bender, PA-C

## 2017-03-03 NOTE — Discharge Summary (Signed)
Date of admission: 01/29/17  Date of death: 03-05-2017.  Cause of death - Aspiration pneumonitis - secondary to - Bowel obstruction - secondary to - severe constipation  Other contributing factors - Diastolic heart failure  Hospital course    1. Ileus/stercoral colitis-secondary to retained stool. Patient presently not having any nausea or vomiting. No drainage noted from the NG tube. -CT scan suggestive of retained stool. Patient took an enema at home prior to coming to the hospital with some minimal response. Dulcolax suppository has helped with 3 BM yesterday. -Continue supportive care with IV fluids, anti-emetics. Appreciated surgical input. - Patient was kept on NG tube and suction, improved ileus initially so NG tube was removed- started on clear liquid diet. But for last 2 days having vomiting and worsening distention again with no BM. -  place NG tube again and plan was to do suctioning, discussed with GI on phone and he suggested to give MiraLAX bowel preparation with soapsuds enema. - Once we decompress her small intestine by NGT suction then we will start her on bowel prep. - later in afternoon- she again had vomits and followed by severe respi distress, suggesting aspiration. - she was transferred to ICU, but she died later on.  2. Leukocytosis - due to # 1.  -  Surgery suggested Zosyn IV due to colitis.  3. Hyperlipidemia - cont. Atorvastatin  4. Hypokalemia - due to N/V.  - improved w/ supplementation and will monitor.   5. Neuropathy - cont. Gabapentin.   * Ac diastolic CHF   Held IV fluids for 1 day.   IV lasix.   Have duoneb to help with coughing and congestions.  Gave her IV fluids at slower rate because of decreased oral intake but now appears having CHF again so stop the fluid now.  * A fib   At home takes cardizem CD oral.   In past pt had refused for anticoagulant to Dr. Saralyn Pilar due to frequent falls.   Currently keeping nothing by mouth, rate is  controlled.  on IV metoprolol.  * hypothyroidism   Resume levothyroxine.   Check TSH.

## 2017-03-03 NOTE — Progress Notes (Signed)
I had discussed earlier with GI physician on phone- he suggested- once decomressed may benefit from miralax bowel prep.  We had placed NG tube, will do wall suction today.  Noted to have more edema on chest xray- so stopped iV fluids.  Spoke to pt's relative today, updated her about the findings and management. Also informed about not improving in last 2-3 days and possible complications of electrolyte imbalance, renal or cardiac failure, aspirations or perforation of bowel. Will continue the current management for now.

## 2017-03-03 NOTE — Progress Notes (Signed)
Pharmacy Antibiotic Note  Tracy Richards is a 83 y.o. female admitted on 01/16/2017 with aspiration pneumonia.  Pharmacy has been consulted for piperacillin/tazobactam dosing. Patient currently receiving piperacillin-tazobactam for ileus and stercoral colitis   Plan: Continue piperacillin-tazobactam 3.375g IV q 8 hours  Height: 5' (152.4 cm) Weight: 134 lb 6.4 oz (61 kg) IBW/kg (Calculated) : 45.5  Temp (24hrs), Avg:98.2 F (36.8 C), Min:97.4 F (36.3 C), Max:98.7 F (37.1 C)   Recent Labs Lab 01/23/2017 2029 01/29/17 0559 01/30/17 0410 02/01/17 0422 02/03/17 0520  WBC 17.1* 18.7* 12.7* 11.1*  --   CREATININE 0.93 0.84 0.78 0.68 0.76    Estimated Creatinine Clearance: 42 mL/min (by C-G formula based on SCr of 0.76 mg/dL).    Allergies  Allergen Reactions  . Amlodipine Swelling  . Avelox [Moxifloxacin Hcl In Nacl] Other (See Comments)    G.I. Upset   . Carafate [Sucralfate] Itching    Ulcers also   . Celecoxib Other (See Comments)    Reaction unknown  . Diazepam Hives  . Doxazosin Other (See Comments)    Reaction unknown  . Klonopin [Clonazepam] Hives  . Levofloxacin Cough    Choking Sensation  . Norvasc [Amlodipine Besylate] Swelling  . Pantoprazole Sodium Itching  . Ranitidine Itching  . Rofecoxib Other (See Comments)    Reaction unknown  . Shellfish Allergy Diarrhea  . Sulfa Antibiotics Other (See Comments)    Mouth Sores  . Sulfa Drugs Cross Reactors Other (See Comments)    Mouth Sores  . Codeine Anxiety    "Makes me crazy"  . Tape Rash and Other (See Comments)    SKIN IS VERY THIN; TEARS AND BRUISES VERY EASILY!!    Antimicrobials this admission: Piperacillin/tazo 4/29 >>  metronidazole 4/29 >> once  Dose adjustments this admission:  Microbiology results: 4/28 UCx: suggest recollection  Thank you for allowing pharmacy to be a part of this patient's care.  Darrow Bussing, PharmD Pharmacy Resident Feb 28, 2017 4:09 PM

## 2017-03-03 DEATH — deceased

## 2017-10-25 IMAGING — MR MR LUMBAR SPINE W/O CM
5 series · 36 of 48 positions shown · non-contrast
Comparison: None.

CLINICAL DATA: 87-year-old female with lower abdominal pain,
weakness and back pain. Urinary frequency. Subsequent encounter.

EXAM:
MRI LUMBAR SPINE WITHOUT CONTRAST
TECHNIQUE: Multiplanar, multisequence MR imaging of the lumbar spine was
performed. No intravenous contrast was administered.

[Series 3: T2 · sagittal · 4.0mm · 0.81mm/px · 6 of 21 slices shown (1 of 2)]
[im 1/21]
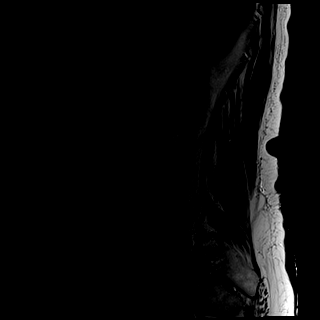
[im 5/21]
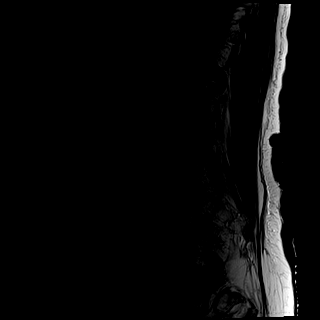
[im 9/21]
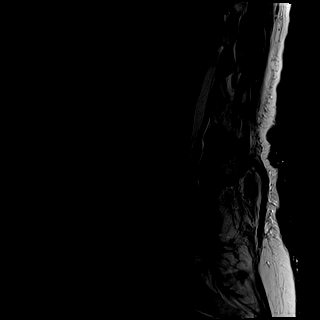
[im 13/21]
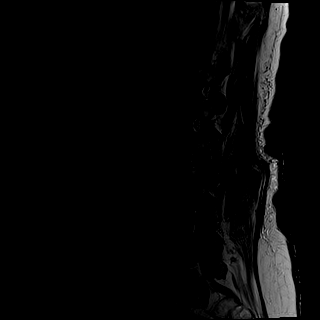
[im 17/21]
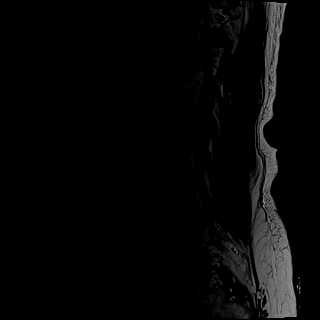
[im 21/21]
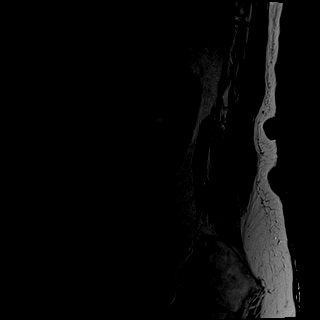

[Series 4: T1 · sagittal · 4.0mm · 0.81mm/px · 7 of 21 slices shown (1 of 2)]
[im 1/21]
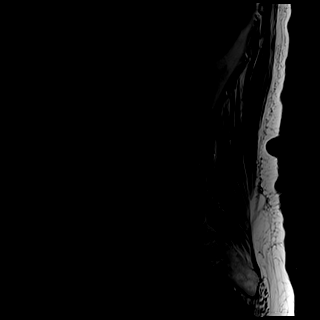
[im 4/21]
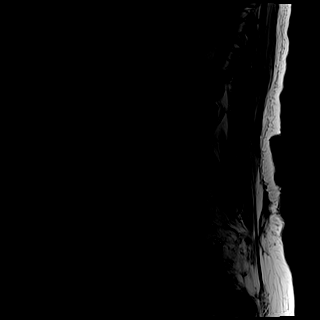
[im 7/21]
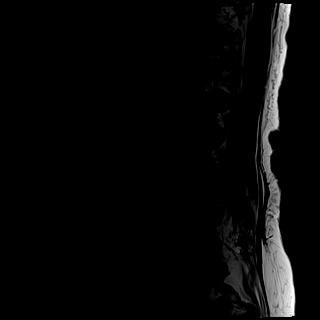
[im 11/21]
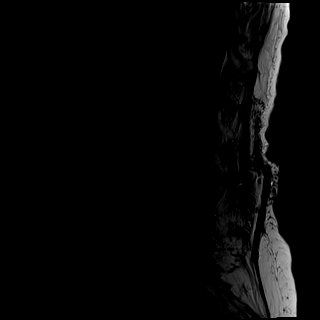
[im 14/21]
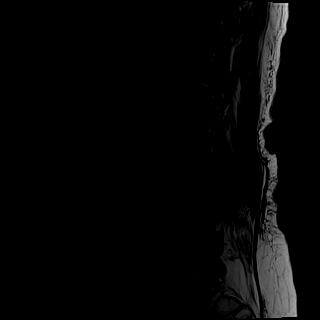
[im 17/21]
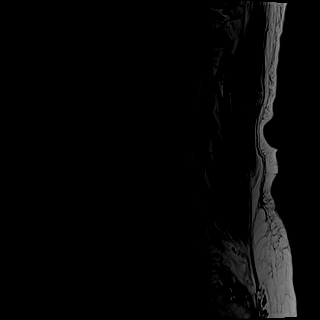
[im 21/21]
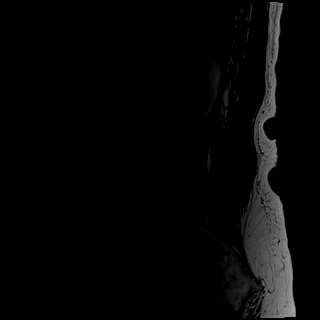

[Series 5: STIR · sagittal · 4.0mm · 1.02mm/px · 7 of 21 slices shown]
[im 1/21]
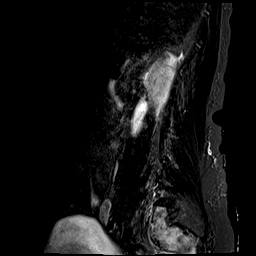
[im 4/21]
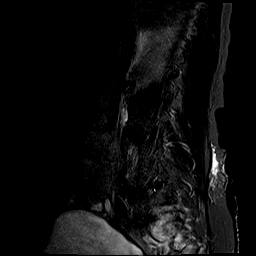
[im 7/21]
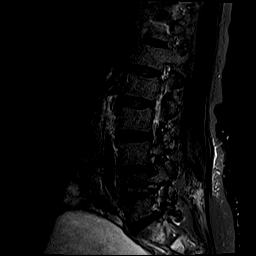
[im 11/21]
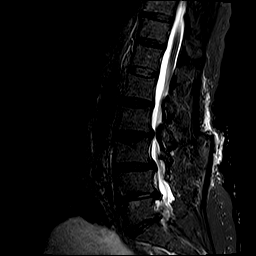
[im 14/21]
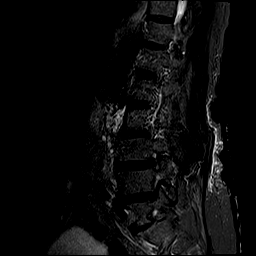
[im 17/21]
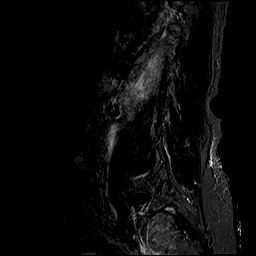
[im 21/21]
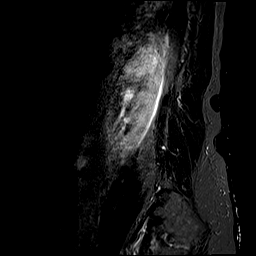

[Series 6: T2 · axial · 4.0mm · 0.78mm/px · z∈[-29,+196]mm · 8 of 44 slices shown (2 of 2)]
[im 1/44]
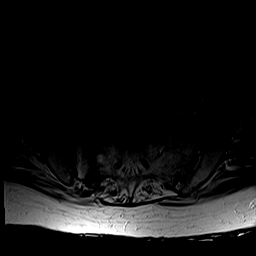
[im 7/44]
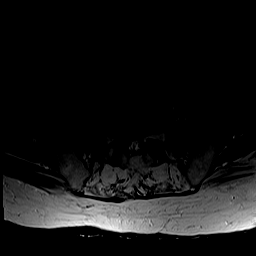
[im 14/44]
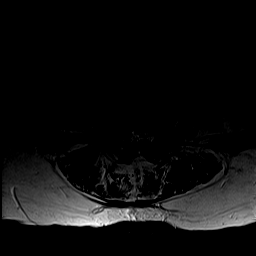
[im 20/44]
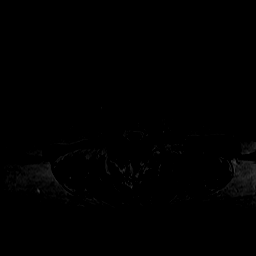
[im 24/44]
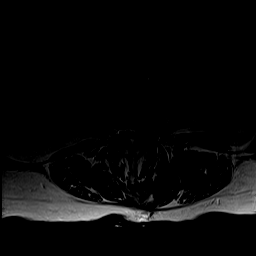
[im 30/44]
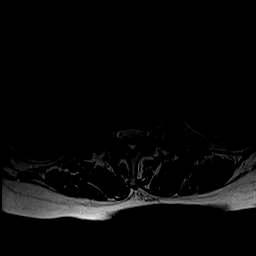
[im 37/44]
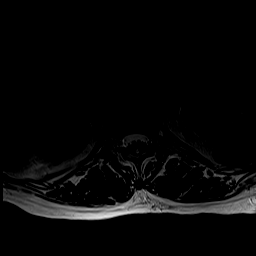
[im 44/44]
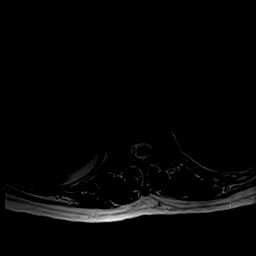

[Series 7: T1 · axial · 4.0mm · 0.39mm/px · z∈[-29,+196]mm · 8 of 44 slices shown (2 of 2)]
[im 1/44]
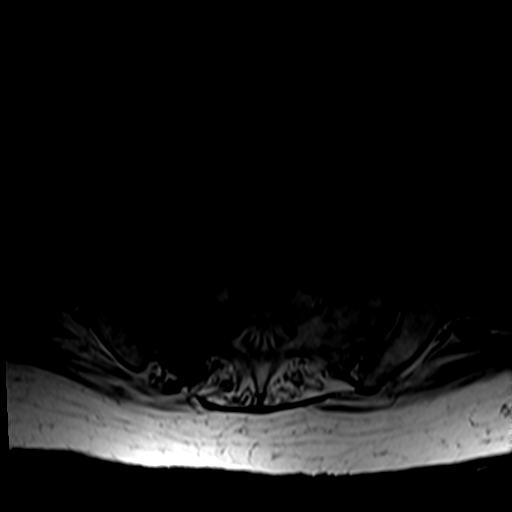
[im 7/44]
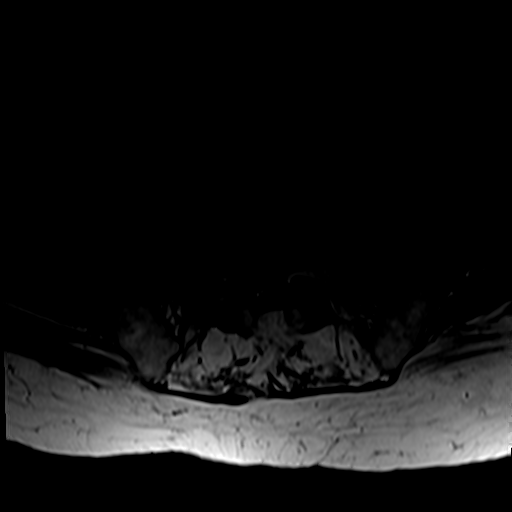
[im 14/44]
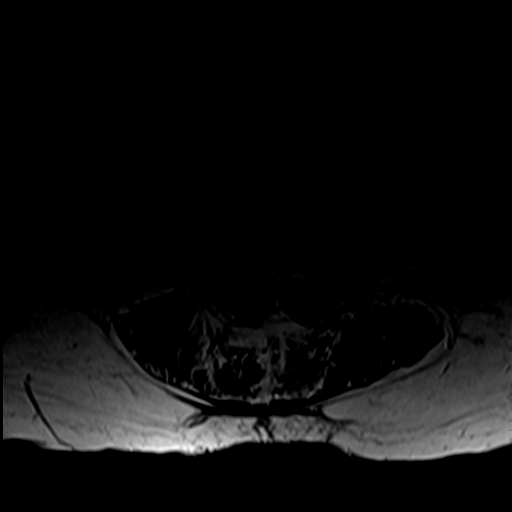
[im 20/44]
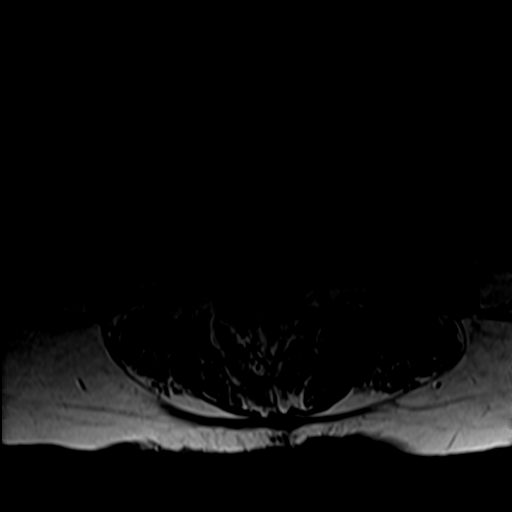
[im 24/44]
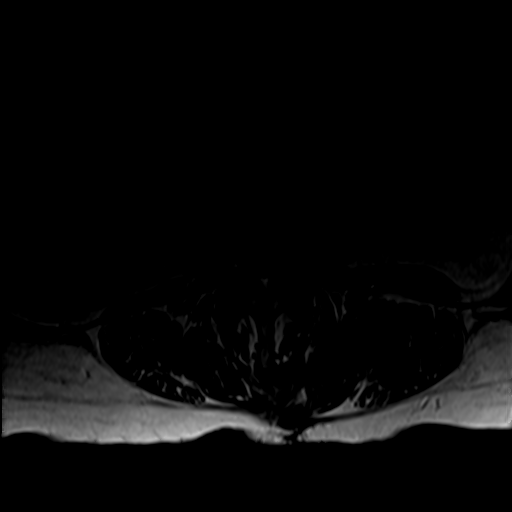
[im 30/44]
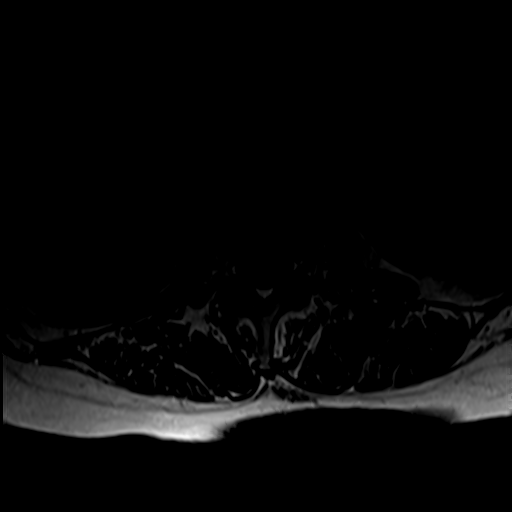
[im 37/44]
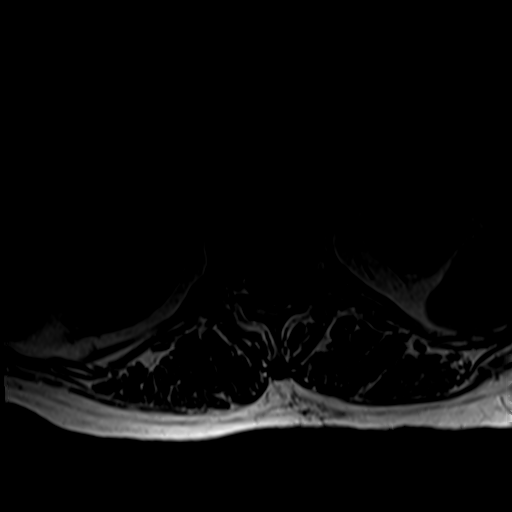
[im 44/44]
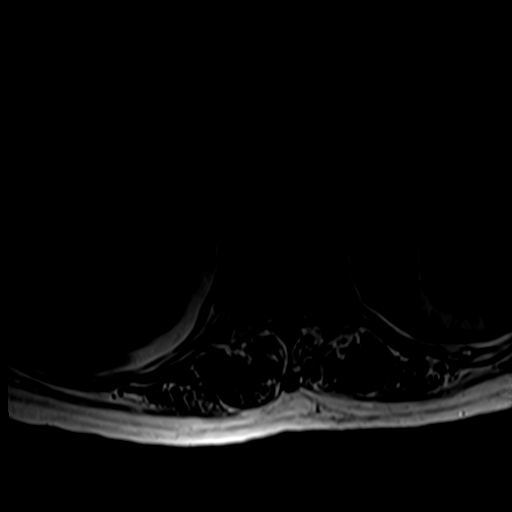

[36 of 48 positions shown; findings below may reference images not displayed]

FINDINGS: Segmentation: Last fully open disk space is labeled L5-S1. Present
examination incorporates from T10-11 disc space through the lower
sacrum.

Alignment: Scoliosis lumbar spine convex left centered at the L4
level.

Vertebrae:  Acute right sacral fracture

Conus medullaris: Extends to the just below the T12-L1 disc space
level and appears normal.

Paraspinal and other soft tissues: Atherosclerotic changes aorta and
iliac arteries.

Disc levels:

T10-11 through T12-L1 unremarkable.

L1-2:  Mild facet bony overgrowth.

L2-3: Facet bony overgrowth. Ligamentum flavum hypertrophy. Short
pedicles. Bulge. Multifactorial severe spinal stenosis.

L3-4: Prior surgery. Facet degenerative changes. 3 mm anterior slip
L3. Bulge. Short pedicles. Multifactorial moderate spinal stenosis.
Foraminal extension of disc greater on the left. Moderate foraminal
narrowing greater on left with mild encroachment upon exiting left
L3 nerve root.

L4-5: Prior laminectomy. Facet degenerative changes. Prominent disc
degeneration with disc space narrowing greater on the right. Bulge/
osteophyte with lateral extension greater to the right. Marked
right-sided and moderate left-sided foraminal narrowing with
encroachment upon exiting L4 nerve roots greater on the right.
Marked left-sided and moderate to marked right-sided lateral recess
stenosis. Mild thecal sac narrowing.

L5-S1: Bilateral L5 pars defects. Bulge/broad-based protrusion with
cephalad extension. Foraminal extension greater on the right. Slight
encroachment upon but not compression of the exiting right L5 nerve
root. Mild right lateral recess narrowing. Thecal sac narrowed by
prominent epidural fat.
IMPRESSION: Acute right sacral fracture with edema/hemorrhage.

L2-3 multifactorial severe spinal stenosis.

L3-4 prior surgery. Multifactorial moderate spinal stenosis.
Moderate foraminal narrowing greater on left with mild encroachment
upon exiting left L3 nerve root.

L4-5 prior laminectomy. Multifactorial marked right-sided and
moderate left-sided foraminal narrowing with encroachment upon
exiting L4 nerve roots greater on the right. Marked left-sided and
moderate to marked right-sided lateral recess stenosis. Mild thecal
sac narrowing.

Bilateral L5 pars defects. L5-S1 bulge/broad-based protrusion.
Foraminal extension greater on the right. Slight encroachment upon
but not compression of the exiting right L5 nerve root. Mild right
lateral recess narrowing. Thecal sac narrowed by prominent epidural
fat.

Please see above for further detail.
